# Patient Record
Sex: Male | Born: 1945 | Race: Black or African American | Hispanic: No | Marital: Married | Smoking: Former smoker
Health system: Southern US, Community
[De-identification: ages and names within clinical notes are randomized; demographics above are authoritative.]

## PROBLEM LIST (undated history)

## (undated) DIAGNOSIS — M1990 Unspecified osteoarthritis, unspecified site: Secondary | ICD-10-CM

## (undated) DIAGNOSIS — Z77098 Contact with and (suspected) exposure to other hazardous, chiefly nonmedicinal, chemicals: Secondary | ICD-10-CM

## (undated) DIAGNOSIS — F32A Depression, unspecified: Secondary | ICD-10-CM

## (undated) DIAGNOSIS — I779 Disorder of arteries and arterioles, unspecified: Secondary | ICD-10-CM

## (undated) DIAGNOSIS — K219 Gastro-esophageal reflux disease without esophagitis: Secondary | ICD-10-CM

## (undated) DIAGNOSIS — I739 Peripheral vascular disease, unspecified: Secondary | ICD-10-CM

## (undated) DIAGNOSIS — J329 Chronic sinusitis, unspecified: Secondary | ICD-10-CM

## (undated) DIAGNOSIS — G4733 Obstructive sleep apnea (adult) (pediatric): Secondary | ICD-10-CM

## (undated) DIAGNOSIS — F4312 Post-traumatic stress disorder, chronic: Secondary | ICD-10-CM

## (undated) DIAGNOSIS — I509 Heart failure, unspecified: Secondary | ICD-10-CM

## (undated) DIAGNOSIS — F4310 Post-traumatic stress disorder, unspecified: Secondary | ICD-10-CM

## (undated) DIAGNOSIS — L0293 Carbuncle, unspecified: Secondary | ICD-10-CM

## (undated) DIAGNOSIS — F40240 Claustrophobia: Secondary | ICD-10-CM

## (undated) DIAGNOSIS — E1140 Type 2 diabetes mellitus with diabetic neuropathy, unspecified: Secondary | ICD-10-CM

## (undated) DIAGNOSIS — N183 Chronic kidney disease, stage 3 unspecified: Secondary | ICD-10-CM

## (undated) DIAGNOSIS — T7840XA Allergy, unspecified, initial encounter: Secondary | ICD-10-CM

## (undated) DIAGNOSIS — F329 Major depressive disorder, single episode, unspecified: Secondary | ICD-10-CM

## (undated) DIAGNOSIS — L0292 Furuncle, unspecified: Secondary | ICD-10-CM

## (undated) DIAGNOSIS — E785 Hyperlipidemia, unspecified: Secondary | ICD-10-CM

## (undated) DIAGNOSIS — Z87891 Personal history of nicotine dependence: Secondary | ICD-10-CM

## (undated) DIAGNOSIS — E119 Type 2 diabetes mellitus without complications: Secondary | ICD-10-CM

## (undated) DIAGNOSIS — I10 Essential (primary) hypertension: Secondary | ICD-10-CM

## (undated) DIAGNOSIS — I2510 Atherosclerotic heart disease of native coronary artery without angina pectoris: Secondary | ICD-10-CM

## (undated) HISTORY — PX: CATARACT EXTRACTION W/ INTRAOCULAR LENS  IMPLANT, BILATERAL: SHX1307

## (undated) HISTORY — PX: HERNIA REPAIR: SHX51

## (undated) HISTORY — DX: Hyperlipidemia, unspecified: E78.5

## (undated) HISTORY — DX: Post-traumatic stress disorder, unspecified: F43.10

## (undated) HISTORY — PX: COLONOSCOPY W/ BIOPSIES AND POLYPECTOMY: SHX1376

## (undated) HISTORY — DX: Contact with and (suspected) exposure to other hazardous, chiefly nonmedicinal, chemicals: Z77.098

## (undated) HISTORY — DX: Claustrophobia: F40.240

## (undated) HISTORY — DX: Obstructive sleep apnea (adult) (pediatric): G47.33

## (undated) HISTORY — DX: Atherosclerotic heart disease of native coronary artery without angina pectoris: I25.10

## (undated) HISTORY — PX: CORONARY ANGIOPLASTY: SHX604

## (undated) HISTORY — PX: ABDOMINAL HERNIA REPAIR: SHX539

## (undated) HISTORY — DX: Post-traumatic stress disorder, chronic: F43.12

## (undated) HISTORY — PX: ABCESS DRAINAGE: SHX399

## (undated) HISTORY — DX: Heart failure, unspecified: I50.9

---

## 1999-07-03 ENCOUNTER — Emergency Department (HOSPITAL_COMMUNITY): Admission: EM | Admit: 1999-07-03 | Discharge: 1999-07-03 | Payer: Self-pay | Admitting: Emergency Medicine

## 1999-10-20 ENCOUNTER — Encounter: Admission: RE | Admit: 1999-10-20 | Discharge: 1999-10-20 | Payer: Self-pay | Admitting: General Surgery

## 1999-10-20 ENCOUNTER — Encounter: Payer: Self-pay | Admitting: General Surgery

## 1999-10-23 ENCOUNTER — Encounter (INDEPENDENT_AMBULATORY_CARE_PROVIDER_SITE_OTHER): Payer: Self-pay | Admitting: Specialist

## 1999-10-23 ENCOUNTER — Ambulatory Visit (HOSPITAL_BASED_OUTPATIENT_CLINIC_OR_DEPARTMENT_OTHER): Admission: RE | Admit: 1999-10-23 | Discharge: 1999-10-23 | Payer: Self-pay | Admitting: General Surgery

## 2000-02-12 ENCOUNTER — Encounter: Admission: RE | Admit: 2000-02-12 | Discharge: 2000-05-12 | Payer: Self-pay | Admitting: Geriatric Medicine

## 2002-05-03 ENCOUNTER — Inpatient Hospital Stay (HOSPITAL_COMMUNITY): Admission: EM | Admit: 2002-05-03 | Discharge: 2002-05-05 | Payer: Self-pay

## 2006-11-12 HISTORY — PX: CYST EXCISION: SHX5701

## 2008-03-23 ENCOUNTER — Emergency Department (HOSPITAL_COMMUNITY): Admission: EM | Admit: 2008-03-23 | Discharge: 2008-03-23 | Payer: Self-pay | Admitting: Emergency Medicine

## 2008-03-25 ENCOUNTER — Emergency Department (HOSPITAL_COMMUNITY): Admission: EM | Admit: 2008-03-25 | Discharge: 2008-03-25 | Payer: Self-pay | Admitting: Family Medicine

## 2008-09-20 ENCOUNTER — Emergency Department (HOSPITAL_COMMUNITY): Admission: EM | Admit: 2008-09-20 | Discharge: 2008-09-20 | Payer: Self-pay | Admitting: Family Medicine

## 2009-12-23 ENCOUNTER — Emergency Department (HOSPITAL_COMMUNITY): Admission: EM | Admit: 2009-12-23 | Discharge: 2009-12-23 | Payer: Self-pay | Admitting: Family Medicine

## 2011-03-21 ENCOUNTER — Other Ambulatory Visit: Payer: Self-pay | Admitting: Specialist

## 2011-03-30 NOTE — Cardiovascular Report (Signed)
[place]. [facility]  Patient:    NICKIE, DEREN Visit Number: [id number] MRN: [id number]          Service Type: MED Location: [phone] Attending Physician:  Lyn Records. Iii Dictated by:   Darci Needle, M.D. Proc. Date: 05/04/02 Admit Date:  05/03/2002 Discharge Date: 05/05/2002                          Cardiac Catheterization  INDICATIONS:  Recent chest discomfort, abnormal Cardiolite, admitted through the emergency room with chest pain, rule out myocardial infarction.  The patient is an obese, hypertensive, African-American, and diabetic.  PROCEDURE: 1. Left heart catheterization. 2. Selective coronary angiography. 3. Left ventriculography. 4. Vasoseal arteriotomy closure.  DESCRIPTION OF PROCEDURE:  After informed consent, a 6 French sheath was inserted into the right femoral artery using the modified Seldinger technique. A 6 French A2 multipurpose catheter was used for hemodynamic recordings, left ventriculography, and selective left and right coronary angiography. Following the procedure arteriotomy closure was performed without complications.  RESULTS:  HEMODYNAMIC DATA:  Aortic pressure 140/80.  Left ventricular pressure 140/17.  LEFT VENTRICLOGRAPHY:  The left ventricle reveals a normal size cavity with hyperdynamic contractility, EF greater than 70%.  There appears to be LVH.  SELECTIVE CORONARY ANGIOGRAPHY: 1. Short left main.  No obstruction noted. 2. Left anterior descending coronary artery.  The LAD is diffusely involved    with plaque proximally to the midvessel.  The first diagonal branch    contains a 70% stenosis. No high grade obstruction was noted in the left    anterior descending distribution other than in the first diagonal. 3. Ramus branch.  There is a large branching ramus that arises from the left    main.  No significant abnormalities are noted. 4. Circumflex artery contains irregularities.  There is 70%  narrowing in the    distal circumflex before the first obtuse marginal and the PDA.  This    stenosis is eccentric, maybe more severely narrowed than appreciated.  The    patients obesity and diaphragm prevented adequate visualization of this    particular coronary segment. 5. Right coronary artery is nondominant with 70 to 80% mid and distal    stenoses.  The acute marginal also contains 80 to 90% narrowing.  ASSESSMENT: 1. Normal left ventricular function.  Left ventricular hypertrophy noted. 2. Moderate coronary artery disease involving the nondominant right coronary,    the first diagonal, and the distal circumflex as outlined above. 3. Significant obesity preventing optimal visualization.  RECOMMENDATION:  Medical therapy, aggressive risk factor modification. Dictated by:   Darci Needle, M.D. Attending Physician:  Lyn Records. Iii DD:  05/04/02 TD:  05/05/02 Job: [id number]

## 2011-03-30 NOTE — H&P (Signed)
[place]. [facility]  Patient:    ZARON, ZWIEFELHOFER Visit Number: [id number] MRN: [id number]          Service Type: MED Location: [phone] Attending Physician:  Armanda Heritage Dictated by:   Darci Needle, M.D. Admit Date:  05/03/2002   CC:         Hal T. Stoneking, M.D.   History and Physical  INDICATION:  Atypical chest and epigastric discomfort.  This is a 65 year old African-American diabetic with an abnormal Cardiolite recently identified.  HISTORY OF PRESENT ILLNESS:  The patient is 65 years of age, African-American, obese, diabetic, with severe hypertension, poorly-controlled.  He awakened this morning with epigastric discomfort and some discomfort in his chest.  He took some Gas-X and then had some racing or fluttering of his chest.  This discomfort was always characterized as relatively mild and quantitated as a 2/10 on the pain scale.  He had a Cardiolite study done approximately a week ago that was suspicious for inferior infarction with peri-infarct ischemia. He is admitted at this time to rule out myocardial infarction and perhaps go ahead and have definitive cardiac evaluation.  HABITS:  Denies ethanol and tobacco.  PAST MEDICAL HISTORY: 1. Hypertension with poor control. 2. Diabetes, noninsulin-dependent. 3. Obesity. 4. Degenerative joint disease, hips. 5. Known mild sleep apnea. 6. Hyperlipidemia.  FAMILY HISTORY:  Positive for diabetes and high blood pressure.  No history of coronary atherosclerotic heart disease.  ALLERGIES:  None.  PAST SURGICAL HISTORY:  Status post inguinal herniorrhaphy.  MEDICATIONS:  He does not remember any of the doses.  We were able to determine that he is on Adalat CC 90 mg per day, HCTZ recently started at 12.5 mg per day, Rhinocort, Clarinex, Monopril possibly 10 mg per day, Amaryl unknown dose, Lipitor 80 mg per day, and Cardura unknown dose.  REVIEW OF SYSTEMS:  Negative melena, negative  hematemesis.  No history of peptic ulcer disease.  Denies COPD/asthma.  No neurological symptoms.  No prior stroke.  No seizure disorder.  Denies claudication.  Denies orthopnea or PND.  Has never had syncope.  PHYSICAL EXAMINATION:  VITAL SIGNS:  Blood pressure 144/66, heart rate 90, _____ 16.  GENERAL:  The patient is in no distress, obese.  SKIN:  Clear, no xanthelasma.  HEENT:  Unremarkable.  Extraocular movements are full.  NECK:  No JVD, thyromegaly, or carotid bruits.  CHEST:  Clear to auscultation and percussion.  CARDIAC:  Cardiac exam reveals an S4, a 1/6 systolic murmur, no diastolic murmur.  PMI is not palpable.  ABDOMEN:  Markedly obese.  Liver edge is not palpable.  There is no abdominal tenderness.  EXTREMITIES:  No edema.  Femoral pulses 2+ bilaterally, deep bilaterally. Posterior tibials are 2+, radials are 2+.  NEUROLOGIC:  Normal.  No motor or sensory deficits noted.  LABORATORY DATA:  Chest x-ray:  Cardiac enlargement with questionable mild vascular congestion.  EKG:  LVH with strain, chronic finding.  CBC:  Hemoglobin 13.9, WBC 8.6.  Creatinine 1.5, BUN 19, potassium 3.8, blood sugar pending.  INR 0.9, PTT 28 seconds.  Troponin I 0.01, CK-MB 168/1.9.  IMPRESSION: 1. Atypical chest and epigastric discomfort in a 65 year old African-American    with diabetes, hypertension, and abnormal Cardiolite.  Rule out myocardial    infarction. 2. Diabetes. 3. Hypertension. 4. Obesity. 5. Possible congestive heart failure on chest x-ray.  PLAN: 1. Admit, rule out MI. 2. Coronary angiography for definitive evaluation of abnormal Cardiolite  and    recurring atypical chest pain. 3. Check BNP and administer Lasix therapy if elevated. Dictated by:   Darci Needle, M.D. Attending Physician:  Armanda Heritage DD:  05/03/02 TD:  05/03/02 Job: [id number]

## 2011-03-30 NOTE — Op Note (Signed)
[place]. [facility]  Patient:    Joel Rivera                         MRN: [id number] Proc. Date: 10/23/99 Adm. Date:  [id number] Attending:  Cherylynn Ridges                           Operative Report  PREOPERATIVE DIAGNOSIS:  Umbilical hernia.  POSTOPERATIVE DIAGNOSIS:  Umbilical hernia.  PROCEDURE:  Repair of umbilical hernia.  SURGEON:  Jimmye Norman, M.D.  ASSISTANT:  None.  ANESTHESIA:  General endotracheal.  ESTIMATED BLOOD LOSS:  Less than 10 cc.  COMPLICATIONS:  None.  CONDITION:  Stable.  INDICATIONS FOR OPERATION:  The patient is a 65 year old gentleman with symptomatic umbilical hernia who now comes in for repair with partial incarceration of what  appears to be omentum.  FINDINGS:  The patient had a little preperitoneal fat or omentum stuck into the  hernia sac.  This was excised, along with the sac, and closure done with Ethibond tier.  DESCRIPTION OF PROCEDURE:  The patient was taken to the operating room and placed on the table in the supine position.  After an adequate general anesthetic was administered, he was prepped and draped in the usual sterile manner exposing the midline of the abdomen.  A supraumbilical curvilinear incision approximately 2-2.2 cm in length was made  using a #10 blade.  We took this down into the subcutaneous tissue and then dissected out the hernia sac, which went directly into the umbilicus.  We dissected the sac away from the subcuticular area of the skin, and subsequently ligated the sac using #0 Ethibond.  We made sure that electrocautery was used to cauterize he edges.  We then reapproximated the fascial edges using interrupted #0 Ethibond sutures.  Approximately six sutures were placed in the repair.  Subsequently, we injected with 0.5% Marcaine without epinephrine.  We closed in two layers.  The  deep subcu layer of 3-0 Vicryl, and the skin was closed using a running subcuticular  stitch of 5-0 Vicryl.  All needle counts, sponge counts, and instrument counts were correct.  A sterile dressing was applied. DD:  10/23/99 TD:  10/23/99 Job: [id number]

## 2011-08-08 LAB — CULTURE, ROUTINE-ABSCESS: Gram Stain: NONE SEEN

## 2012-11-01 ENCOUNTER — Emergency Department (HOSPITAL_COMMUNITY)
Admission: EM | Admit: 2012-11-01 | Discharge: 2012-11-01 | Disposition: A | Discharge status: Home / Self Care | Payer: Medicare Other | Attending: Emergency Medicine | Admitting: Emergency Medicine

## 2012-11-01 ENCOUNTER — Encounter (HOSPITAL_COMMUNITY): Payer: Self-pay | Admitting: *Deleted

## 2012-11-01 DIAGNOSIS — L0291 Cutaneous abscess, unspecified: Secondary | ICD-10-CM

## 2012-11-01 DIAGNOSIS — Z8709 Personal history of other diseases of the respiratory system: Secondary | ICD-10-CM | POA: Insufficient documentation

## 2012-11-01 DIAGNOSIS — L0293 Carbuncle, unspecified: Secondary | ICD-10-CM | POA: Insufficient documentation

## 2012-11-01 DIAGNOSIS — J329 Chronic sinusitis, unspecified: Secondary | ICD-10-CM | POA: Insufficient documentation

## 2012-11-01 DIAGNOSIS — Z87891 Personal history of nicotine dependence: Secondary | ICD-10-CM | POA: Insufficient documentation

## 2012-11-01 DIAGNOSIS — L02818 Cutaneous abscess of other sites: Secondary | ICD-10-CM | POA: Insufficient documentation

## 2012-11-01 DIAGNOSIS — E119 Type 2 diabetes mellitus without complications: Secondary | ICD-10-CM | POA: Insufficient documentation

## 2012-11-01 DIAGNOSIS — Z79899 Other long term (current) drug therapy: Secondary | ICD-10-CM | POA: Insufficient documentation

## 2012-11-01 DIAGNOSIS — Z794 Long term (current) use of insulin: Secondary | ICD-10-CM | POA: Insufficient documentation

## 2012-11-01 DIAGNOSIS — I10 Essential (primary) hypertension: Secondary | ICD-10-CM | POA: Insufficient documentation

## 2012-11-01 HISTORY — DX: Furuncle, unspecified: L02.92

## 2012-11-01 HISTORY — DX: Carbuncle, unspecified: L02.93

## 2012-11-01 HISTORY — DX: Essential (primary) hypertension: I10

## 2012-11-01 MED ORDER — SULFAMETHOXAZOLE-TRIMETHOPRIM 800-160 MG PO TABS: 1.0000 | ORAL_TABLET | Freq: Two times a day (BID) | ORAL | Status: DC

## 2012-11-01 NOTE — ED Notes (Addendum)
Pt Reports posterior abscess x 4-5 months. States "it wasn't painful until now. I get them all the time because I am a war vet. I usually go to the [place], but the pain was bad so I came here". Abscess aprrox 6in long. Aprrox 3 in wide.

## 2012-11-01 NOTE — ED Notes (Signed)
PA and EDP at bedside.

## 2012-11-01 NOTE — ED Notes (Signed)
PT reports a painful abces on back of head. Pt reports a Hx of this due to Agent orange exposure.

## 2012-11-01 NOTE — ED Notes (Signed)
Pt states understanding of discharge instructions 

## 2012-11-01 NOTE — ED Provider Notes (Signed)
History     CSN: [id number]  Arrival date & time 11/01/12  1946   First MD Initiated Contact with Patient 11/01/12 2141      Chief Complaint  Patient presents with  . Abscess    back of head    (Consider location/radiation/quality/duration/timing/severity/associated sxs/prior treatment) HPI Comments: This is a 66 year old male, who presents emergency department with chief complaint of abscess. Patient states that he is at an abscess on the back of his head for the past 3 months. He states that it has been worsening. He has not tried anything to alleviate his symptoms. It is painful to the touch, and pressure makes his pain worse. States that his pain is moderate to severe.  The history is provided by the patient. No language interpreter was used.    Past Medical History  Diagnosis Date  . Diabetes mellitus without complication   . Hypertension   . Sinus infection     frequent  . Recurrent boils     abceses    History reviewed. No pertinent past surgical history.  History reviewed. No pertinent family history.  History  Substance Use Topics  . Smoking status: Former Smoker    Quit date: 11/01/1993  . Smokeless tobacco: Not on file  . Alcohol Use: No      Review of Systems  All other systems reviewed and are negative.    Allergies  Review of patient's allergies indicates no known allergies.  Home Medications   Current Outpatient Rx  Name  Route  Sig  Dispense  Refill  . CHLORTHALIDONE 50 MG PO TABS   Oral   Take 25 mg by mouth daily.         Marland Kitchen DOCUSATE SODIUM 100 MG PO CAPS   Oral   Take 200 mg by mouth 2 (two) times daily as needed. For constipation         . FLUTICASONE PROPIONATE 50 MCG/ACT NA SUSP   Nasal   Place 2 sprays into the nose daily.         . FOSINOPRIL SODIUM 40 MG PO TABS   Oral   Take 40 mg by mouth daily.         . FUROSEMIDE 20 MG PO TABS   Oral   Take 20 mg by mouth daily.         . INSULIN GLARGINE 100 UNIT/ML  [place] SOLN   Subcutaneous   Inject 45 Units into the skin at bedtime.         Marland Kitchen LORATADINE 10 MG PO TABS   Oral   Take 10 mg by mouth daily.         Marland Kitchen NAPROXEN SODIUM 220 MG PO TABS   Oral   Take 440 mg by mouth daily as needed. For pain         . NIFEDIPINE ER OSMOTIC 60 MG PO TB24   Oral   Take 30 mg by mouth 2 (two) times daily.         Marland Kitchen OMEPRAZOLE 20 MG PO CPDR   Oral   Take 20 mg by mouth 2 (two) times daily.         Marland Kitchen PRAVASTATIN SODIUM 10 MG PO TABS   Oral   Take 5 mg by mouth at bedtime.         . TERAZOSIN HCL 10 MG PO CAPS   Oral   Take 10 mg by mouth at bedtime.         Marland Kitchen  SULFAMETHOXAZOLE-TRIMETHOPRIM 800-160 MG PO TABS   Oral   Take 1 tablet by mouth every 12 (twelve) hours.   20 tablet   0     BP 148/60  Pulse 75  Temp 98.3 F (36.8 C) (Oral)  Resp 18  SpO2 97%  Physical Exam  Nursing note and vitals reviewed. Constitutional: He is oriented to person, place, and time. He appears well-developed and well-nourished.  HENT:  Head: Normocephalic and atraumatic.       4 x 6 cm fluctuant abscess in the suboccipital region, without surrounding erythema, a small amount of drainage present.  Eyes: Conjunctivae normal and EOM are normal.  Neck: Normal range of motion.  Cardiovascular: Normal rate.   Pulmonary/Chest: Effort normal.  Abdominal: He exhibits no distension.  Musculoskeletal: Normal range of motion.  Neurological: He is alert and oriented to person, place, and time.  Skin: Skin is dry.  Psychiatric: He has a normal mood and affect. His behavior is normal. Judgment and thought content normal.    ED Course  Procedures (including critical care time)  Labs Reviewed - No data to display No results found. INCISION AND DRAINAGE Performed by: Roxy Horseman Consent: Verbal consent obtained. Risks and benefits: risks, benefits and alternatives were discussed Type: abscess  Body area: suboccipital region  Anesthesia: local  infiltration  Incision was made with a scalpel.  Local anesthetic: lidocaine 2% with epinephrine  Anesthetic total: 5 ml  Complexity: complex Blunt dissection to break up loculations  Drainage: purulent  Drainage amount: 10 ml  Packing material: Not packed, elliptical incision used.   Patient tolerance: Patient tolerated the procedure well with no immediate complications.     1. Abscess       MDM  66 year old male with abscess. Drained of the fluctuant material from the abscess, remains slightly indurated, I will tell the patient to use warm compresses and will prescribe Bactrim. I will have the patient followup with his primary care provider as these abscesses are recurrent. Patient understands and agrees with the plan. He is stable and ready for discharge.        Roxy Horseman, PA-C 11/01/12 2343

## 2012-11-02 NOTE — ED Provider Notes (Signed)
Medical screening examination/treatment/procedure(s) were performed by non-physician practitioner and as supervising physician I was immediately available for consultation/collaboration.  Olivia Mackie, MD 11/02/12 [phone]

## 2013-02-13 ENCOUNTER — Emergency Department (INDEPENDENT_AMBULATORY_CARE_PROVIDER_SITE_OTHER)
Admission: EM | Admit: 2013-02-13 | Discharge: 2013-02-13 | Disposition: A | Discharge status: Home / Self Care | Payer: Medicare Other | Source: Home / Self Care | Attending: Emergency Medicine | Admitting: Emergency Medicine

## 2013-02-13 ENCOUNTER — Encounter (HOSPITAL_COMMUNITY): Payer: Self-pay | Admitting: Emergency Medicine

## 2013-02-13 DIAGNOSIS — J019 Acute sinusitis, unspecified: Secondary | ICD-10-CM

## 2013-02-13 DIAGNOSIS — M47812 Spondylosis without myelopathy or radiculopathy, cervical region: Secondary | ICD-10-CM

## 2013-02-13 DIAGNOSIS — J309 Allergic rhinitis, unspecified: Secondary | ICD-10-CM

## 2013-02-13 DIAGNOSIS — I10 Essential (primary) hypertension: Secondary | ICD-10-CM

## 2013-02-13 MED ORDER — AMOXICILLIN 500 MG PO CAPS: 1000.0000 mg | ORAL_CAPSULE | Freq: Three times a day (TID) | ORAL | Status: DC

## 2013-02-13 MED ORDER — TRAMADOL HCL 50 MG PO TABS: 100.0000 mg | ORAL_TABLET | Freq: Three times a day (TID) | ORAL | Status: DC | PRN

## 2013-02-13 MED ORDER — METHOCARBAMOL 500 MG PO TABS: 500.0000 mg | ORAL_TABLET | Freq: Three times a day (TID) | ORAL | Status: DC

## 2013-02-13 MED ORDER — MELOXICAM 7.5 MG PO TABS: 7.5000 mg | ORAL_TABLET | Freq: Every day | ORAL | Status: DC

## 2013-02-13 MED ORDER — AZELASTINE HCL 0.15 % NA SOLN: NASAL | Status: DC

## 2013-02-13 NOTE — ED Notes (Signed)
Reports: sinus pressure and pain. Post nasal drip. Productive cough with clear sputum.  Neck pain/stiffness with movement. Swollen lymph nodes. Pt denies sob. Chest pain. And fever  Pt has used mucinex and aleve with mild relief of symptoms.   Pt states symptoms present x several weeks now.

## 2013-02-13 NOTE — ED Provider Notes (Addendum)
Chief Complaint:   Chief Complaint  Patient presents with  . Facial Pain    sinus pressure and pain. post nasal drip. productive cough    History of Present Illness:   Joel Rivera is a 67 year old male with diabetes and hypertension who presents today with nasal congestion and neck pain.  1. Nasal congestion: This is been going on for months or years. He has clear nasal drainage, sneezing, nasal itching, and itchy, watery eyes. Sometimes his congestion is yellowish in color. He has copious postnasal drip and cough productive of clear sputum. He denies any fever, chills, headache, facial pain, or sore throat. He's had no wheezing or history of allergies or asthma.  2. Neck pain: This is been going on for months or years also it hurts him to turn his neck. He notes decreased range of motion in his neck and crepitus with movement. He denies any pain radiating down the arms, numbness, tingling, or muscle weakness in the upper extremities.  Review of Systems:  Other than noted above, the patient denies any of the following symptoms. Systemic:  No fever, chills, sweats, fatigue, myalgias, headache, or anorexia. Eye:  No redness, pain or drainage. ENT:  No earache, nasal congestion, rhinorrhea, sinus pressure, or sore throat. Lungs:  No cough, sputum production, wheezing, shortness of breath.  Cardiovascular:  No chest pain, palpitations, or syncope. GI:  No nausea, vomiting, abdominal pain or diarrhea. GU:  No dysuria, frequency, or hematuria. Skin:  No rash or pruritis.  PMFSH:  Past medical history, family history, social history, meds, and allergies were reviewed.  He takes chlorthalidone, Monopril, Lasix, Lantus insulin, Claritin, Procardia, Pravachol, Hytrin, Flonase, and Anaprox. He has no known medication allergies.  Physical Exam:   Vital signs:  BP 138/80  Pulse 86  Temp(Src) 98.7 F (37.1 C) (Oral)  SpO2 95% General:  Alert, in no distress. Eye:  PERRL, full EOMs.  Lids and  conjunctivas were normal. ENT:  TMs and canals were normal, without erythema or inflammation.  Nasal mucosa was clear and uncongested, without drainage.  Mucous membranes were moist.  Pharynx was clear, without exudate or drainage.  There were no oral ulcerations or lesions. Neck:  He has mild tenderness to palpation over trapezius ridges. The neck has a very limited range of motion with 20 of extension, 45 of flexion, 10 of lateral bending, and 45 of rotation with pain. Thyroid was normal. Lungs:  No respiratory distress.  Lungs were clear to auscultation, without wheezes, rales or rhonchi.  Breath sounds were clear and equal bilaterally. Heart:  Regular rhythm, without gallops, murmers or rubs. Abdomen:  Soft, flat, and non-tender to palpation.  No hepatosplenomagaly or mass. Skin:  Clear, warm, and dry, without rash or lesions.  Assessment:  The primary encounter diagnosis was Allergic rhinitis. Diagnoses of Acute sinusitis, Osteoarthritis cervical spine, and Hypertension were also pertinent to this visit.  He appears to have allergic rhinitis with secondary sinusitis. His neck pain it appears to be on the basis of degenerative joint disease or osteoarthritis. He was urged to followup with a primary care physician for both of these issues within a week.  Plan:   1.  The following meds were prescribed:   Discharge Medication List as of 02/13/2013  5:41 PM    START taking these medications   Details  amoxicillin (AMOXIL) 500 MG capsule Take 2 capsules (1,000 mg total) by mouth 3 (three) times daily., Starting 02/13/2013, Until Discontinued, Normal    Azelastine  HCl 0.15 % SOLN 2 sprays in each nostril BID for allergies, Normal    meloxicam (MOBIC) 7.5 MG tablet Take 1 tablet (7.5 mg total) by mouth daily., Starting 02/13/2013, Until Discontinued, Normal    methocarbamol (ROBAXIN) 500 MG tablet Take 1 tablet (500 mg total) by mouth 3 (three) times daily., Starting 02/13/2013, Until Discontinued,  Normal    traMADol (ULTRAM) 50 MG tablet Take 2 tablets (100 mg total) by mouth every 8 (eight) hours as needed for pain., Starting 02/13/2013, Until Discontinued, Normal       2.  The patient was instructed in symptomatic care and handouts were given. 3.  The patient was told to return if becoming worse in any way, if no better in 3 or 4 days, and given some red flag symptoms such as fever or neurological symptoms that would indicate earlier return.    Reuben Likes, MD 02/13/13 1958  Reuben Likes, MD 02/13/13 [phone]

## 2013-06-02 ENCOUNTER — Emergency Department (HOSPITAL_COMMUNITY)
Admission: EM | Admit: 2013-06-02 | Discharge: 2013-06-02 | Disposition: A | Discharge status: Home / Self Care | Payer: Medicare Other | Source: Home / Self Care | Attending: Family Medicine | Admitting: Family Medicine

## 2013-06-02 ENCOUNTER — Encounter (HOSPITAL_COMMUNITY): Payer: Self-pay | Admitting: *Deleted

## 2013-06-02 DIAGNOSIS — IMO0002 Reserved for concepts with insufficient information to code with codable children: Secondary | ICD-10-CM

## 2013-06-02 DIAGNOSIS — L03113 Cellulitis of right upper limb: Secondary | ICD-10-CM

## 2013-06-02 MED ORDER — DOXYCYCLINE HYCLATE 100 MG PO CAPS: 100.0000 mg | ORAL_CAPSULE | Freq: Two times a day (BID) | ORAL | Status: DC

## 2013-06-02 NOTE — ED Notes (Signed)
Pt noticed  A  Bump on r  Forearm  Which  He  Reports  Has  Been there  For  3 days    The bump is      Hard  To the  Touch        He  Is   A   Diabetic

## 2013-06-02 NOTE — ED Provider Notes (Addendum)
History    CSN: [id number] Arrival date & time 06/02/13  1039  None    Chief Complaint  Patient presents with  . Abscess   (Consider location/radiation/quality/duration/timing/severity/associated sxs/prior Treatment) Patient is a 67 y.o. male presenting with abscess. The history is provided by the patient.  Abscess Location:  Shoulder/arm Shoulder/arm abscess location:  R forearm Abscess quality: induration, painful and redness   Abscess quality: not draining, no fluctuance and no warmth   Red streaking: no   Duration:  3 days Progression:  Unchanged Pain details:    Quality:  Dull   Severity:  Mild  Past Medical History  Diagnosis Date  . Diabetes mellitus without complication   . Hypertension   . Sinus infection     frequent  . Recurrent boils     abceses   History reviewed. No pertinent past surgical history. History reviewed. No pertinent family history. History  Substance Use Topics  . Smoking status: Former Smoker    Quit date: 11/01/1993  . Smokeless tobacco: Not on file  . Alcohol Use: No    Review of Systems  Constitutional: Negative.   Skin: Negative.     Allergies  Review of patient's allergies indicates no known allergies.  Home Medications   Current Outpatient Rx  Name  Route  Sig  Dispense  Refill  . amoxicillin (AMOXIL) 500 MG capsule   Oral   Take 2 capsules (1,000 mg total) by mouth 3 (three) times daily.   60 capsule   0     Dispense as written.   . Azelastine HCl 0.15 % SOLN      2 sprays in each nostril BID for allergies   30 mL   3   . chlorthalidone (HYGROTON) 50 MG tablet   Oral   Take 25 mg by mouth daily.         Marland Kitchen docusate sodium (COLACE) 100 MG capsule   Oral   Take 200 mg by mouth 2 (two) times daily as needed. For constipation         . doxycycline (VIBRAMYCIN) 100 MG capsule   Oral   Take 1 capsule (100 mg total) by mouth 2 (two) times daily.   20 capsule   0   . fluticasone (FLONASE) 50 MCG/ACT  nasal spray   Nasal   Place 2 sprays into the nose daily.         . fosinopril (MONOPRIL) 40 MG tablet   Oral   Take 40 mg by mouth daily.         . furosemide (LASIX) 20 MG tablet   Oral   Take 20 mg by mouth daily.         . insulin glargine (LANTUS) 100 UNIT/ML injection   Subcutaneous   Inject 45 Units into the skin at bedtime.         Marland Kitchen loratadine (CLARITIN) 10 MG tablet   Oral   Take 10 mg by mouth daily.         . meloxicam (MOBIC) 7.5 MG tablet   Oral   Take 1 tablet (7.5 mg total) by mouth daily.   15 tablet   0   . methocarbamol (ROBAXIN) 500 MG tablet   Oral   Take 1 tablet (500 mg total) by mouth 3 (three) times daily.   30 tablet   0   . naproxen sodium (ANAPROX) 220 MG tablet   Oral   Take 440 mg by mouth daily as needed.  For pain         . NIFEdipine (PROCARDIA XL/ADALAT-CC) 60 MG 24 hr tablet   Oral   Take 30 mg by mouth 2 (two) times daily.         Marland Kitchen omeprazole (PRILOSEC) 20 MG capsule   Oral   Take 20 mg by mouth 2 (two) times daily.         . pravastatin (PRAVACHOL) 10 MG tablet   Oral   Take 5 mg by mouth at bedtime.         . sulfamethoxazole-trimethoprim (SEPTRA DS) 800-160 MG per tablet   Oral   Take 1 tablet by mouth every 12 (twelve) hours.   20 tablet   0   . terazosin (HYTRIN) 10 MG capsule   Oral   Take 10 mg by mouth at bedtime.         . traMADol (ULTRAM) 50 MG tablet   Oral   Take 2 tablets (100 mg total) by mouth every 8 (eight) hours as needed for pain.   30 tablet   0    BP 130/80  Pulse 78  Temp(Src) 98.6 F (37 C) (Oral)  Resp 14  SpO2 100% Physical Exam  Nursing note and vitals reviewed. Constitutional: He is oriented to person, place, and time. He appears well-developed and well-nourished.  Musculoskeletal: He exhibits tenderness.  Neurological: He is alert and oriented to person, place, and time.  Skin: Skin is warm and dry. Rash noted. There is erythema.  1.5 cm circ erythematous  lesion to right volar forearm, indurated, no fluct or drainage. Min soreness.    ED Course  Procedures (including critical care time) Labs Reviewed - No data to display No results found. 1. Cellulitis of arm, right     MDM    Linna Hoff, MD 06/02/13 1144  Linna Hoff, MD 06/02/13 [phone]

## 2014-06-05 ENCOUNTER — Encounter (HOSPITAL_COMMUNITY): Payer: Self-pay | Admitting: Emergency Medicine

## 2014-06-05 ENCOUNTER — Emergency Department (INDEPENDENT_AMBULATORY_CARE_PROVIDER_SITE_OTHER)
Admission: EM | Admit: 2014-06-05 | Discharge: 2014-06-05 | Disposition: A | Discharge status: Home / Self Care | Payer: Medicare Other | Source: Home / Self Care | Attending: Family Medicine | Admitting: Family Medicine

## 2014-06-05 DIAGNOSIS — L723 Sebaceous cyst: Secondary | ICD-10-CM

## 2014-06-05 DIAGNOSIS — L089 Local infection of the skin and subcutaneous tissue, unspecified: Secondary | ICD-10-CM

## 2014-06-05 MED ORDER — TRAMADOL HCL 50 MG PO TABS: 50.0000 mg | ORAL_TABLET | Freq: Four times a day (QID) | ORAL | Status: DC | PRN

## 2014-06-05 MED ORDER — CEPHALEXIN 500 MG PO CAPS: 500.0000 mg | ORAL_CAPSULE | Freq: Two times a day (BID) | ORAL | Status: DC

## 2014-06-05 NOTE — ED Provider Notes (Addendum)
CSN: [id number]     Arrival date & time 06/05/14  1456 History   First MD Initiated Contact with Patient 06/05/14 1550     Chief Complaint  Patient presents with  . Recurrent Skin Infections   (Consider location/radiation/quality/duration/timing/severity/associated sxs/prior Treatment) HPI  Recurring skin infection: L neck. Growing. Painful to palpation. Draining the other day but made worse after squeezing it. Present for months. Gradual growth. Denies fevers, rash, weakness. H/lo recurring boils.    Past Medical History  Diagnosis Date  . Diabetes mellitus without complication   . Hypertension   . Sinus infection     frequent  . Recurrent boils     abceses   History reviewed. No pertinent past surgical history. History reviewed. No pertinent family history. History  Substance Use Topics  . Smoking status: Former Smoker    Quit date: 11/01/1993  . Smokeless tobacco: Not on file  . Alcohol Use: No    Review of Systems Per HPI with all other pertinent systems negative.   Allergies  Review of patient's allergies indicates no known allergies.  Home Medications   Prior to Admission medications   Medication Sig Start Date End Date Taking? Authorizing Provider  amoxicillin (AMOXIL) 500 MG capsule Take 2 capsules (1,000 mg total) by mouth 3 (three) times daily. 02/13/13   Harden Mo, MD  Azelastine HCl 0.15 % SOLN 2 sprays in each nostril BID for allergies 02/13/13   Harden Mo, MD  cephALEXin (KEFLEX) 500 MG capsule Take 1 capsule (500 mg total) by mouth 2 (two) times daily. 06/05/14   Waldemar Dickens, MD  chlorthalidone (HYGROTON) 50 MG tablet Take 25 mg by mouth daily.    Historical Provider, MD  docusate sodium (COLACE) 100 MG capsule Take 200 mg by mouth 2 (two) times daily as needed. For constipation    Historical Provider, MD  doxycycline (VIBRAMYCIN) 100 MG capsule Take 1 capsule (100 mg total) by mouth 2 (two) times daily. 06/02/13   Billy Fischer, MD  fluticasone  (FLONASE) 50 MCG/ACT nasal spray Place 2 sprays into the nose daily.    Historical Provider, MD  fosinopril (MONOPRIL) 40 MG tablet Take 40 mg by mouth daily.    Historical Provider, MD  furosemide (LASIX) 20 MG tablet Take 20 mg by mouth daily.    Historical Provider, MD  insulin glargine (LANTUS) 100 UNIT/ML injection Inject 45 Units into the skin at bedtime.    Historical Provider, MD  loratadine (CLARITIN) 10 MG tablet Take 10 mg by mouth daily.    Historical Provider, MD  meloxicam (MOBIC) 7.5 MG tablet Take 1 tablet (7.5 mg total) by mouth daily. 02/13/13   Harden Mo, MD  methocarbamol (ROBAXIN) 500 MG tablet Take 1 tablet (500 mg total) by mouth 3 (three) times daily. 02/13/13   Harden Mo, MD  naproxen sodium (ANAPROX) 220 MG tablet Take 440 mg by mouth daily as needed. For pain    Historical Provider, MD  NIFEdipine (PROCARDIA XL/ADALAT-CC) 60 MG 24 hr tablet Take 30 mg by mouth 2 (two) times daily.    Historical Provider, MD  omeprazole (PRILOSEC) 20 MG capsule Take 20 mg by mouth 2 (two) times daily.    Historical Provider, MD  pravastatin (PRAVACHOL) 10 MG tablet Take 5 mg by mouth at bedtime.    Historical Provider, MD  sulfamethoxazole-trimethoprim (SEPTRA DS) 800-160 MG per tablet Take 1 tablet by mouth every 12 (twelve) hours. 11/01/12   Montine Circle, PA-C  terazosin (HYTRIN) 10 MG capsule Take 10 mg by mouth at bedtime.    Historical Provider, MD  traMADol (ULTRAM) 50 MG tablet Take 2 tablets (100 mg total) by mouth every 8 (eight) hours as needed for pain. 02/13/13   Harden Mo, MD  traMADol (ULTRAM) 50 MG tablet Take 1 tablet (50 mg total) by mouth every 6 (six) hours as needed. 06/05/14   Waldemar Dickens, MD   BP 156/80  Pulse 72  Temp(Src) 98.8 F (37.1 C) (Oral)  SpO2 96% Physical Exam  Constitutional: He is oriented to person, place, and time. He appears well-developed and well-nourished. No distress.  HENT:  Head: Normocephalic and atraumatic.  Eyes: EOM are  normal. Pupils are equal, round, and reactive to light.  Neck: Normal range of motion.  Cardiovascular: Normal rate, normal heart sounds and intact distal pulses.   No murmur heard. Pulmonary/Chest: Effort normal and breath sounds normal. No respiratory distress.  Abdominal: Soft. He exhibits no distension.  Musculoskeletal: Normal range of motion. He exhibits no edema and no tenderness.  Neurological: He is alert and oriented to person, place, and time.  Skin: Skin is warm. He is not diaphoretic.  Large firm 4x4cm projection of the upper back w/ draining sinus tract (clear drainage).   Psychiatric: His behavior is normal.    ED Course  Procedures (including critical care time) Labs Review Labs Reviewed  WOUND CULTURE   Incision and Drainage Procedure Note  Pre-operative Diagnosis: infected sebaceous cyst  Post-operative Diagnosis: same    Anesthesia: 1% lidocaine with epinephrine  Procedure Details  The procedure, risks and complications have been discussed in detail (including, but not limited to airway compromise, infection, bleeding) with the patient, and the patient has signed consent to the procedure.  The skin was sterilely prepped and draped over the affected area in the usual fashion. After adequate local anesthesia, I&D with a #11 blade was performed on the left, posterior back. 2.cm incision. Loculations broken up. Purulent drainage: present  The patient was observed until stable.  Findings: Approximately 2.5cm deep and 3cm circumferential   10 inches of iodiform gauze placed  Condition: Tolerated procedure well   Complications: none.   Imaging Review No results found.   MDM   1. Infected sebaceous cyst    Large infected cyst. Copious purulent drainage on incision. Copious irrigation and iodiform gauze placed. Due to loculations, and size of infection and h/o recent attempts to pop the cyst, will start on Keflex. Remove 1-2 inches packing per day and  apply gauze daily until wound closes. Return precautions given. NSAIDs not an option so limited # tramadol given F/u PCP in several months for removal of cyst.   Linna Darner, MD Family Medicine 06/05/2014, 4:27 PM      Waldemar Dickens, MD 06/05/14 1627     -------------------------------------- Addendum  Cx w/ Pseudo in addition to other G+ Start cipro Pt called and aware  Waldemar Dickens, MD 06/08/14 2134

## 2014-06-05 NOTE — ED Notes (Signed)
C/o boil on back States the area was busted last night with little drainage States area is sore

## 2014-06-05 NOTE — Discharge Instructions (Signed)
You had a very large infected sebaceous cyst of your back This was successfully drinaed in our clinic Please remove 1-2 inches of the packing per day and cut it away Please use the pain medications as needed Please start the keflex today Please come back if this does not resolve or gets worse Please consider getting the cyst removed sometime in the next several months by your PCP.

## 2014-06-08 LAB — WOUND CULTURE: Special Requests: NORMAL

## 2014-06-08 MED ORDER — CIPROFLOXACIN HCL 500 MG PO TABS: 500.0000 mg | ORAL_TABLET | Freq: Two times a day (BID) | ORAL | Status: DC

## 2014-06-08 NOTE — ED Notes (Signed)
Wound culture back cyst: Few Pseudomonas Aeruginosa. Pt. treated with Keflex. Needs Cipro.  Dr. Marily Memos said he would try to call in AM. Roselyn Meier 06/08/2014

## 2014-06-09 ENCOUNTER — Telehealth (HOSPITAL_COMMUNITY): Payer: Self-pay | Admitting: *Deleted

## 2014-06-09 NOTE — ED Notes (Addendum)
Wound culture back: Few pseudomonas aeruginosa.  Pt. treated with Keflex.  7/28  Dr. Marily Memos said he was going to call pt. in AM and add Cipro.  I called pt. tonight.  He said he talked with Dr. Marily Memos and has picked up his Rx. of Cipro. He said he was starting it tonight.  Pt. instructed to f/u with his PCP to make sure it goes away completely.  Pt. voiced understanding. Roselyn Meier 06/09/2014

## 2014-06-11 NOTE — ED Notes (Signed)
Pt.'s wife called and said they have been removing 1-2 inches of packing a day with dressing change and today is the 5th day.  She asked if they continue 1 " /day or take it all out.  Discussed with Dr. Bridgett Larsson. She said take it all out.  Pt.'s wife notified and does not feel comfortable doing this. I told her he can come tonight or in AM and let the Dr. remove it and recheck the wound. Pt. notified as well. Roselyn Meier 06/11/2014

## 2014-06-12 ENCOUNTER — Encounter (HOSPITAL_COMMUNITY): Payer: Self-pay | Admitting: Emergency Medicine

## 2014-06-12 ENCOUNTER — Emergency Department (INDEPENDENT_AMBULATORY_CARE_PROVIDER_SITE_OTHER)
Admission: EM | Admit: 2014-06-12 | Discharge: 2014-06-12 | Disposition: A | Discharge status: Home / Self Care | Payer: Medicare Other | Source: Home / Self Care | Attending: Emergency Medicine | Admitting: Emergency Medicine

## 2014-06-12 DIAGNOSIS — L089 Local infection of the skin and subcutaneous tissue, unspecified: Secondary | ICD-10-CM

## 2014-06-12 DIAGNOSIS — L723 Sebaceous cyst: Secondary | ICD-10-CM

## 2014-06-12 NOTE — ED Notes (Signed)
Recheck of infected cyst.  His wife did not want to remove the packing.  She has been cutting 1-2 " off the last 5 days.  Pt. Wants referral to surgeon to get cyst sack removed.

## 2014-06-12 NOTE — Discharge Instructions (Signed)
Now that the packing has been removed from your abscess, you may bathe and shower as normal.  You should change the dressing at least once a day.  You may change it more often if it becomes soiled with drainage.  Wash the wound well with soap and water, pat dry, apply an antibiotic ointment (Neosporin, Polysporin, or Bacitracin are all OK), then cover with a non-stick dressing such as Telfa with several layers of plain gause over that to absorb drainage.  You may secure this in place with tape or with roll gauze and tape.  Keep the wound covered for until it stops draining.  This usually takes 7 days, but may be longer.  Return for a recheck if you have heavy bleeding, increasing pain, fever, or the wound looks worse.   Epidermal Cyst An epidermal cyst is usually a small, painless lump under the skin. Cysts often occur on the face, neck, stomach, chest, or genitals. The cyst may be filled with a bad smelling paste. Do not pop your cyst. Popping the cyst can cause pain and puffiness (swelling). HOME CARE   Only take medicines as told by your doctor.  Take your medicine (antibiotics) as told. Finish it even if you start to feel better. GET HELP RIGHT AWAY IF:  Your cyst is tender, red, or puffy.  You are not getting better, or you are getting worse.  You have any questions or concerns. MAKE SURE YOU:  Understand these instructions.  Will watch your condition.  Will get help right away if you are not doing well or get worse. Document Released: 12/06/2004 Document Revised: 04/29/2012 Document Reviewed: 05/07/2011 [facility] Patient Information 2015 [place], [place]. This information is not intended to replace advice given to you by your health care provider. Make sure you discuss any questions you have with your health care provider.

## 2014-06-12 NOTE — ED Provider Notes (Signed)
  Chief Complaint   Chief Complaint  Patient presents with  . Wound Check    History of Present Illness   Joel Rivera is a 68-year-old male who had a sebaceous cyst on his upper back, just at the base of the neck which had become infected and was incised and drained on July 25, 7 days ago. This was packed. His wife have been removing the packing and culture grew out Pseudomonas and gram-positive cocci. Cipro was added to his regimen it still little bit sore. He denies any fever or chills. Comes in today to have the remainder the packing removed.  Review of Systems   Other than as noted above, the patient denies any of the following symptoms: Systemic:  No fever, chills or sweats. Skin:  No rash or itching.  [place]   Past medical history, family history, social history, meds, and allergies were reviewed. He has diabetes and hypertension. He is on multiple medication including chlorthalidone, furosemide, Lantus, Procardia XL, Prilosec, pravastatin, and Monopril.  Physical Examination     Vital signs:  BP 137/74  Pulse 90  Temp(Src) 98.2 F (36.8 C) (Oral)  SpO2 96% Skin:  He has an incision on his upper back which has packing in it. There's no surrounding erythema, swelling, or induration.  There was no crepitus, necrosis, ecchymosis, or herrhagic bullae. Skin exam was otherwise normal.  No rash. Ext:  Distal pulses were full, patient has full ROM of all joints.  Course in Urgent [place]   The dressing was removed and the packing was removed. The wound cavity appears clear. Some serosanguineous fluid was drained out. The wound was cleansed with saline and antibiotic ointment was applied followed by a sterile dressing.  Assessment   The encounter diagnosis was Infected sebaceous cyst.  I told the patient to keep a watch over this and if it did seem to be recurring to get in to see a surgeon for definitive removal.  Plan     1.  Meds:  The following meds were prescribed:    Discharge Medication List as of 06/12/2014  3:01 PM      2.  Patient Education/Counseling:  The patient was given appropriate handouts, self care instructions, and instructed in symptomatic relief. Instructed to finish up his antibiotics and instructed in wound care.    3.  Follow up:  The patient was instructed to return if this becomes worse in any way.       Harden Mo, MD 06/12/14 2100

## 2014-07-13 NOTE — ED Notes (Signed)
Info  For referrell for   Dr  Zella Richer  Given to pts      Wife     As   Directed

## 2014-10-01 ENCOUNTER — Emergency Department (HOSPITAL_COMMUNITY)
Admission: EM | Admit: 2014-10-01 | Discharge: 2014-10-01 | Disposition: A | Discharge status: Home / Self Care | Payer: Medicare Other | Attending: Emergency Medicine | Admitting: Emergency Medicine

## 2014-10-01 ENCOUNTER — Encounter (HOSPITAL_COMMUNITY): Payer: Self-pay | Admitting: *Deleted

## 2014-10-01 DIAGNOSIS — Z7982 Long term (current) use of aspirin: Secondary | ICD-10-CM | POA: Insufficient documentation

## 2014-10-01 DIAGNOSIS — E119 Type 2 diabetes mellitus without complications: Secondary | ICD-10-CM | POA: Insufficient documentation

## 2014-10-01 DIAGNOSIS — Z79899 Other long term (current) drug therapy: Secondary | ICD-10-CM | POA: Insufficient documentation

## 2014-10-01 DIAGNOSIS — Z87891 Personal history of nicotine dependence: Secondary | ICD-10-CM | POA: Diagnosis not present

## 2014-10-01 DIAGNOSIS — L723 Sebaceous cyst: Secondary | ICD-10-CM

## 2014-10-01 DIAGNOSIS — Z794 Long term (current) use of insulin: Secondary | ICD-10-CM | POA: Diagnosis not present

## 2014-10-01 DIAGNOSIS — L729 Follicular cyst of the skin and subcutaneous tissue, unspecified: Secondary | ICD-10-CM | POA: Diagnosis present

## 2014-10-01 DIAGNOSIS — E669 Obesity, unspecified: Secondary | ICD-10-CM | POA: Diagnosis not present

## 2014-10-01 DIAGNOSIS — Z8709 Personal history of other diseases of the respiratory system: Secondary | ICD-10-CM | POA: Diagnosis not present

## 2014-10-01 DIAGNOSIS — I10 Essential (primary) hypertension: Secondary | ICD-10-CM | POA: Insufficient documentation

## 2014-10-01 DIAGNOSIS — Z7951 Long term (current) use of inhaled steroids: Secondary | ICD-10-CM | POA: Insufficient documentation

## 2014-10-01 NOTE — Discharge Instructions (Signed)
Epidermal Cyst An epidermal cyst is usually a small, painless lump under the skin. Cysts often occur on the face, neck, stomach, chest, or genitals. The cyst may be filled with a bad smelling paste. Do not pop your cyst. Popping the cyst can cause pain and puffiness (swelling). HOME CARE   Only take medicines as told by your doctor.  Take your medicine (antibiotics) as told. Finish it even if you start to feel better. GET HELP RIGHT AWAY IF:  Your cyst is tender, red, or puffy.  You are not getting better, or you are getting worse.  You have any questions or concerns. MAKE SURE YOU:  Understand these instructions.  Will watch your condition.  Will get help right away if you are not doing well or get worse. Document Released: 12/06/2004 Document Revised: 04/29/2012 Document Reviewed: 05/07/2011 [employer] Patient Information 2015 [employer]. This information is not intended to replace advice given to you by your health care provider. Make sure you discuss any questions you have with your health care provider.  

## 2014-10-01 NOTE — ED Provider Notes (Signed)
CSN: [id number]     Arrival date & time 10/01/14  1329 History  This chart was scribed for non-physician practitioner, Starlyn Skeans, PA-C,working with Blanchie Dessert, MD, by Marlowe Kays, ED Scribe. This patient was seen in room TR09C/TR09C and the patient's care was started at 2:30 PM.  Chief Complaint  Patient presents with  . Abscess   Patient is a 68 y.o. male presenting with abscess. The history is provided by the patient. No language interpreter was used.  Abscess Associated symptoms: no fever, no nausea and no vomiting     HPI Comments:  Joel Rivera is a 68 y.o. male with PMH of DM, HTN and recurrent abscesses who presents to the Emergency Department complaining of two cysts to the posterior head that he noticed several months ago. He reports moderate pain of the areas. He states he was seen at the [facility] hospital to try and find out why he got these recurrently with no specific diagnosis. He has not done anything to treat the symptoms. Denies modifying factors. He denies fever, chills, nausea or vomiting.   Past Medical History  Diagnosis Date  . Diabetes mellitus without complication   . Hypertension   . Sinus infection     frequent  . Recurrent boils     abceses  . Obesity    Past Surgical History  Procedure Laterality Date  . Cyst excision Left 2008    inguinal area   Family History  Problem Relation Age of Onset  . Urinary tract infection Mother   . Pneumonia Mother   . Aneurysm Father    History  Substance Use Topics  . Smoking status: Former Smoker    Quit date: 11/01/1993  . Smokeless tobacco: Not on file  . Alcohol Use: No    Review of Systems  Constitutional: Negative for fever and chills.  Gastrointestinal: Negative for nausea and vomiting.  Skin:       Cysts to posterior head.  All other systems reviewed and are negative.   Allergies  Review of patient's allergies indicates no known allergies.  Home Medications   Prior to Admission  medications   Medication Sig Start Date End Date Taking? Authorizing Provider  aspirin 81 MG tablet Take 81 mg by mouth daily.   Yes Historical Provider, MD  Azelastine HCl 0.15 % SOLN 2 sprays in each nostril BID for allergies 02/13/13  Yes Harden Mo, MD  chlorthalidone (HYGROTON) 50 MG tablet Take 25 mg by mouth daily.   Yes Historical Provider, MD  fluticasone (FLONASE) 50 MCG/ACT nasal spray Place 2 sprays into the nose daily.   Yes Historical Provider, MD  insulin glargine (LANTUS) 100 UNIT/ML injection Inject 45 Units into the skin at bedtime.   Yes Historical Provider, MD  JARDIANCE 25 MG TABS tablet Take 1 tablet by mouth daily. 09/15/14  Yes Historical Provider, MD  loratadine (CLARITIN) 10 MG tablet Take 10 mg by mouth daily.   Yes Historical Provider, MD  losartan (COZAAR) 25 MG tablet Take 1 tablet by mouth daily. 09/14/14  Yes Historical Provider, MD  NIFEdipine (PROCARDIA XL/ADALAT-CC) 60 MG 24 hr tablet Take 30 mg by mouth daily.    Yes Historical Provider, MD  omeprazole (PRILOSEC) 20 MG capsule Take 20 mg by mouth daily.    Yes Historical Provider, MD  amoxicillin (AMOXIL) 500 MG capsule Take 2 capsules (1,000 mg total) by mouth 3 (three) times daily. Patient not taking: Reported on 10/01/2014 02/13/13   Harden Mo,  MD  cephALEXin (KEFLEX) 500 MG capsule Take 1 capsule (500 mg total) by mouth 2 (two) times daily. Patient not taking: Reported on 10/01/2014 06/05/14   Waldemar Dickens, MD  ciprofloxacin (CIPRO) 500 MG tablet Take 1 tablet (500 mg total) by mouth 2 (two) times daily. Patient not taking: Reported on 10/01/2014 06/08/14   Waldemar Dickens, MD  doxycycline (VIBRAMYCIN) 100 MG capsule Take 1 capsule (100 mg total) by mouth 2 (two) times daily. Patient not taking: Reported on 10/01/2014 06/02/13   Billy Fischer, MD  meloxicam (MOBIC) 7.5 MG tablet Take 1 tablet (7.5 mg total) by mouth daily. Patient not taking: Reported on 10/01/2014 02/13/13   Harden Mo, MD   methocarbamol (ROBAXIN) 500 MG tablet Take 1 tablet (500 mg total) by mouth 3 (three) times daily. Patient not taking: Reported on 10/01/2014 02/13/13   Harden Mo, MD  pravastatin (PRAVACHOL) 10 MG tablet Take 5 mg by mouth every other day.     Historical Provider, MD  sulfamethoxazole-trimethoprim (SEPTRA DS) 800-160 MG per tablet Take 1 tablet by mouth every 12 (twelve) hours. Patient not taking: Reported on 10/01/2014 11/01/12   Montine Circle, PA-C  traMADol (ULTRAM) 50 MG tablet Take 1 tablet (50 mg total) by mouth every 6 (six) hours as needed. Patient not taking: Reported on 10/01/2014 06/05/14   Waldemar Dickens, MD   Triage Vitals: BP 125/97 mmHg  Pulse 73  Temp(Src) 97.9 F (36.6 C) (Oral)  Resp 12  Ht 5\' 8"  (1.727 m)  Wt 271 lb (122.925 kg)  BMI 41.22 kg/m2  SpO2 94% Physical Exam  Constitutional: He is oriented to person, place, and time. He appears well-developed and well-nourished.  HENT:  Head: Normocephalic and atraumatic.  Eyes: EOM are normal.  Neck: Normal range of motion.  Cardiovascular: Normal rate, regular rhythm and normal heart sounds.  Exam reveals no gallop and no friction rub.   No murmur heard. Pulmonary/Chest: Effort normal and breath sounds normal. No respiratory distress. He has no wheezes. He has no rales.  Musculoskeletal: Normal range of motion.  Neurological: He is alert and oriented to person, place, and time.  Skin: Skin is warm and dry.  5 x 4 cm round, firm, indurated nodule with central opening that is tender to palpation. 3 cm below is a 1 x 1 cm that is not as tender to palpation as the one above.  Psychiatric: He has a normal mood and affect. His behavior is normal.  Nursing note and vitals reviewed.   ED Course  Procedures (including critical care time) DIAGNOSTIC STUDIES:   COORDINATION OF CARE: 2:37 PM- Will speak with Dr. Maryan Rued and refer to dermatology. Pt verbalizes understanding and agrees to plan.  Medications - No  data to display  Labs Review Labs Reviewed - No data to display  Imaging Review No results found.   EKG Interpretation None      MDM   Final diagnoses:  Sebaceous cyst   Patient is a 68 year old male who presents to the emergency room with sebaceous cysts to the occiput. There are no evidence of any signs of infection at this time. There is no erythema warmth and redness or discharge from the sebaceous cysts. I am not inclined to perform incision and drainage at this time. I will refer the patient to dermatology for further evaluation. Patient is to return for warmth, redness, fever, nausea, vomiting, or any other concerning symptoms. Patient states understanding and agreement at this time.  I have suggested exfoliation and warm compresses. Patient states understanding and agreement at this point. I've spoken with Dr. Maryan Rued who agrees with the above plan and treatment. Patient is stable for discharge.  I personally performed the services described in this documentation, which was scribed in my presence. The recorded information has been reviewed and is accurate.    Cherylann Parr, PA-C 10/01/14 1452  Blanchie Dessert, MD 10/01/14 [phone]

## 2014-10-01 NOTE — ED Notes (Signed)
Pt given several resources for [facility] dermatologists.

## 2014-10-01 NOTE — ED Notes (Signed)
Reports having cysts to back of his head for extended amount of time, thinks it needs to be drained and reports hx of multiple abscess/cysts this year. Denies fever or chills. No acute distress noted at triage.

## 2014-11-15 DIAGNOSIS — I2510 Atherosclerotic heart disease of native coronary artery without angina pectoris: Secondary | ICD-10-CM | POA: Diagnosis not present

## 2014-11-15 DIAGNOSIS — E118 Type 2 diabetes mellitus with unspecified complications: Secondary | ICD-10-CM | POA: Diagnosis not present

## 2014-11-15 DIAGNOSIS — I10 Essential (primary) hypertension: Secondary | ICD-10-CM | POA: Diagnosis not present

## 2014-11-15 DIAGNOSIS — G588 Other specified mononeuropathies: Secondary | ICD-10-CM | POA: Diagnosis not present

## 2014-11-17 DIAGNOSIS — I10 Essential (primary) hypertension: Secondary | ICD-10-CM | POA: Diagnosis not present

## 2014-11-17 DIAGNOSIS — E118 Type 2 diabetes mellitus with unspecified complications: Secondary | ICD-10-CM | POA: Diagnosis not present

## 2015-02-14 DIAGNOSIS — E118 Type 2 diabetes mellitus with unspecified complications: Secondary | ICD-10-CM | POA: Diagnosis not present

## 2015-02-14 DIAGNOSIS — I10 Essential (primary) hypertension: Secondary | ICD-10-CM | POA: Diagnosis not present

## 2015-02-14 DIAGNOSIS — E119 Type 2 diabetes mellitus without complications: Secondary | ICD-10-CM | POA: Diagnosis not present

## 2015-02-14 DIAGNOSIS — Z125 Encounter for screening for malignant neoplasm of prostate: Secondary | ICD-10-CM | POA: Diagnosis not present

## 2015-02-16 DIAGNOSIS — I10 Essential (primary) hypertension: Secondary | ICD-10-CM | POA: Diagnosis not present

## 2015-02-16 DIAGNOSIS — E119 Type 2 diabetes mellitus without complications: Secondary | ICD-10-CM | POA: Diagnosis not present

## 2015-02-16 DIAGNOSIS — G4733 Obstructive sleep apnea (adult) (pediatric): Secondary | ICD-10-CM | POA: Diagnosis not present

## 2015-02-16 DIAGNOSIS — N529 Male erectile dysfunction, unspecified: Secondary | ICD-10-CM | POA: Diagnosis not present

## 2015-03-16 ENCOUNTER — Ambulatory Visit (INDEPENDENT_AMBULATORY_CARE_PROVIDER_SITE_OTHER): Payer: Medicare Other | Admitting: Neurology

## 2015-03-16 ENCOUNTER — Encounter: Payer: Self-pay | Admitting: Neurology

## 2015-03-16 VITALS — BP 137/68 | HR 87 | Resp 18 | Ht 68.11 in | Wt 281.0 lb

## 2015-03-16 DIAGNOSIS — G4733 Obstructive sleep apnea (adult) (pediatric): Secondary | ICD-10-CM | POA: Diagnosis not present

## 2015-03-16 DIAGNOSIS — F40240 Claustrophobia: Secondary | ICD-10-CM | POA: Diagnosis not present

## 2015-03-16 DIAGNOSIS — F4312 Post-traumatic stress disorder, chronic: Secondary | ICD-10-CM

## 2015-03-16 HISTORY — DX: Post-traumatic stress disorder, chronic: F43.12

## 2015-03-16 HISTORY — DX: Obstructive sleep apnea (adult) (pediatric): G47.33

## 2015-03-16 HISTORY — DX: Claustrophobia: F40.240

## 2015-03-16 NOTE — Progress Notes (Signed)
SLEEP MEDICINE CLINIC   Provider:  Larey Seat, M D  Referring Provider: Jani Gravel, MD Primary Care Physician:  Default, Provider, MD  Chief Complaint  Patient presents with  . sleep consult    rm 10, new pt, alone    HPI:  Joel Rivera is a 69 y.o. afro [place] male, married  seen here as a referral  from Dr. Maudie Mercury for  "Sleep apnea evaluation and Treatment ".   Joel Rivera is a 69 year old right-handed gentleman who is presenting here today with a history of snoring and fragmented sleep. He states that his wife has witnessed him to snore loudly. He reports he doesn't  wake up in the middle of the night from his own snoring or choking. He actually feels that he sleeps quite okay?. 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 The PTSD has interfered with his ability to treat sleep apnea when was originally diagnosed. By now he states he has been through 30 for sleep test he was tested twice at [facility] but states that he had no follow-up. The [facility] gave him also a home sleep test, and he states that he had no follow-up from that. The patient has a long list of current medications was I have reviewed including his list of allergies.  He carries a diagnosis of  Obesity , diabetes mellitus type 2, hypertension, coronary artery disease, he has allergic rhinitis, PTSD and renal insufficiency. He was recently placed on Lexapro he states this has helped with some of the PTSD it has not helped with sleep.   The patient  Goes to bed, describes his bedroom is cool, quiet, dark and he shares that this is wife. He goes to bed at about 10:50 PM, sometimes he has a hard time cooling down or quieting down because his mind is racing. This is attributed to PTSD. His wife has sometimes noticed sleep hallucinations. He is  not aware of any sleep talking, sometimes he wakes up out of the dream sometimes with a flashback. He rises in the morning at about 5:30 he wakes up spontaneously does not rely on an alarm. His average sleep time is about 6 hours at night he usually sleeps is having to go to the bathroom at night. His never woken by headaches or pains. He regularly breakfasts for the control this diabetes. 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  Review of Systems: Out of a complete 14 system review, the patient complains of only the following symptoms, and all other reviewed systems are negative. He endorsed the Epworth sleepiness score at 4 points and the fatigue severity score at 25 points.  On lexapro.    History   Social History  . Marital Status: Married    Spouse Name: N/A  . Number of Children: N/A  . Years of Education: N/A   Occupational History  . Not on file.   Social History Main Topics  . Smoking status: Former Smoker    Quit date: 11/01/1988  . Smokeless tobacco: Not on file  . Alcohol Use: No  .  Drug Use: No  . Sexual Activity: Yes    Birth Control/ Protection: Condom   Other Topics Concern  . Not on file   Social History Narrative   No caffeine.    Family History  Problem Relation Age of Onset  . Urinary tract infection Mother   . Pneumonia Mother   . Aneurysm Father     Past Medical History  Diagnosis Date  . Diabetes mellitus without complication   . Hypertension   . Sinus infection     frequent  . Recurrent boils     abceses  . Obesity   . Hyperlipidemia   . CAD (coronary artery disease)   . Neuropathy   . CHF (congestive heart failure)   . OSA (obstructive sleep apnea)   . PTSD (post-traumatic stress disorder)   . Agent orange exposure     Past Surgical History  Procedure Laterality Date  . Cyst excision Left  2008    inguinal area  . Hernia repair    . Cardiac catheterization      Current Outpatient Prescriptions  Medication Sig Dispense Refill  . amoxicillin (AMOXIL) 500 MG capsule Take 2 capsules (1,000 mg total) by mouth 3 (three) times daily. 60 capsule 0  . aspirin 81 MG tablet Take 81 mg by mouth daily.    . Azelastine HCl 0.15 % SOLN 2 sprays in each nostril BID for allergies 30 mL 3  . cephALEXin (KEFLEX) 500 MG capsule Take 1 capsule (500 mg total) by mouth 2 (two) times daily. 20 capsule 0  . cetirizine (ZYRTEC) 10 MG tablet Take 10 mg by mouth daily.    . chlorthalidone (HYGROTON) 50 MG tablet Take 25 mg by mouth daily.    . ciprofloxacin (CIPRO) 500 MG tablet Take 1 tablet (500 mg total) by mouth 2 (two) times daily. 20 tablet 0  . citalopram (CELEXA) 20 MG tablet Take 20 mg by mouth daily.    Marland Kitchen doxycycline (VIBRAMYCIN) 100 MG capsule Take 1 capsule (100 mg total) by mouth 2 (two) times daily. 20 capsule 0  . escitalopram (LEXAPRO) 5 MG tablet Take 5 mg by mouth daily.    . fluticasone (FLONASE) 50 MCG/ACT nasal spray Place 2 sprays into the nose daily.    . insulin glargine (LANTUS) 100 UNIT/ML injection Inject 45 Units into the skin at bedtime.    Marland Kitchen JARDIANCE 25 MG TABS tablet Take 1 tablet by mouth daily.    Marland Kitchen l-methylfolate-B6-B12 (METANX) 3-35-2 MG TABS Take 1 tablet by mouth daily.    Marland Kitchen loratadine (CLARITIN) 10 MG tablet Take 10 mg by mouth daily.    Marland Kitchen losartan (COZAAR) 25 MG tablet Take 1 tablet by mouth daily.    . meloxicam (MOBIC) 7.5 MG tablet Take 1 tablet (7.5 mg total) by mouth daily. 15 tablet 0  . methocarbamol (ROBAXIN) 500 MG tablet Take 1 tablet (500 mg total) by mouth 3 (three) times daily. 30 tablet 0  . NIFEdipine (PROCARDIA XL/ADALAT-CC) 60 MG 24 hr tablet Take 30 mg by mouth daily.     Marland Kitchen omeprazole (PRILOSEC) 20 MG capsule Take 20 mg by mouth daily.     . rosuvastatin (CRESTOR) 20 MG tablet Take 20 mg by mouth daily.    Marland Kitchen sulfamethoxazole-trimethoprim  (SEPTRA DS) 800-160 MG per tablet Take 1 tablet by mouth every 12 (twelve) hours. 20 tablet 0  . traMADol (ULTRAM) 50 MG tablet Take 1 tablet (50 mg total) by mouth every 6 (six) hours as needed.  15 tablet 0   No current facility-administered medications for this visit.    Allergies as of 03/16/2015  . (No Known Allergies)    Vitals: BP 137/68 mmHg  Pulse 87  Resp 18  Ht 5' 8.11" (1.73 m)  Wt 281 lb (127.461 kg)  BMI 42.59 kg/m2 Last Weight:  Wt Readings from Last 1 Encounters:  03/16/15 281 lb (127.461 kg)       Last Height:   Ht Readings from Last 1 Encounters:  03/16/15 5' 8.11" (1.73 m)    Physical exam:  General: The patient is awake, alert and appears not in acute distress. The patient is well groomed. Head: Normocephalic, atraumatic. Neck is supple. Mallampati 4 ,  neck circumference: 18, ROM limited by arthritis. . Nasal airflow unrestricted , TMJ is not evident . Retrognathia is not seen.  Cardiovascular:  Regular rate and rhythm , without  murmurs or carotid bruit, and without distended neck veins. Respiratory: Lungs are clear to auscultation. Skin:  Without evidence of edema, or rash Trunk: BMI is  elevated and patient  has normal posture.  Neurologic exam : The patient is awake and alert, oriented to place and time.   Memory subjective  described as intact. There is a normal attention span & concentration ability. Speech is fluent without dysarthria, dysphonia or aphasia. Mood and affect are appropriate.  Cranial nerves: Pupils are equal and briskly reactive to light. Funduscopic exam without  evidence of pallor or edema.  Extraocular movements  in vertical and horizontal planes intact and without nystagmus. Visual fields by finger perimetry are intact. Hearing to finger rub intact.  Facial sensation intact to fine touch. Facial motor strength is symmetric and tongue and uvula move midline.  Motor exam:Normal tone ,muscle bulk and symmetric strength in all  extremities.  Sensory:  Fine touch, pinprick and vibration were tested in all extremities.  Proprioception is normal.  Coordination: Rapid alternating movements in the fingers/hands is normal.  Finger-to-nose maneuver without evidence of ataxia, dysmetria or tremor.  Gait and station: Patient walks without assistive device,  is able unassisted to climb up to the exam table.  Strength within normal limits. Stance is stable and normal. Tandem gait is fragmented.  Romberg testing is negative.  Deep tendon reflexes: in the  upper and lower extremities are symmetric and intact. He has crepitus over the right shoulder and knee- osteoarthritis.  Babinski maneuver response is downgoing.   Assessment:  After physical and neurologic examination, review of laboratory studies, imaging, neurophysiology testing and pre-existing records, assessment is   Mr. Nutting has multiple risk factors for obstructive sleep apnea including is truncal obesity but his extremities are not obese. He does have an enlarged neck he has a high-grade Mallampati he has also a large tongue. He does not have retrognathia he does not wear any retainers . He has upper and lower dentures. He has been multiple times tested before and was found to have sleep apnea but could not tolerate CPAP therapy.  The intolerance to CPAP therapy comes from his PTSD which is now treated for the first time was Lexapro and previously when he had sleep test he was not yet treated for PTSD or depression at all. It is possible that he is no longer as sensitive, it is also possible that and non-CPAP therapy can be applied. Dental device is will boost the lower jaw forward and open space and the back they work well for those patients with mild sleep apnea that do  not suffer from oxygen desaturations. Some ENT procedures can also help especially as the patient would be a loud snorer but he reports he's not. I do think that his insomnia is related to both PTSD and  some underlying respiratory nocturnal disorder. For this reason I would like him to have a sleep test to know the degree of apnea no and also how much desaturation time is associated with it and what the nadir of oxygen at night as I will ask for a CO2 study as well. If the patient's AHI is over 20 and his scoring is set at 4% I will either treat the same night in the sleep lab or we will meet after the sleep study to discuss what is to do was to be done next.  He has CO AD and thus is at higher risk to have OSA as well. Cardiologist  Pernell Dupre, MD   The patient was advised of the nature of the diagnosed sleep disorder , the treatment options and risks for general a health and wellness arising from not treating the condition. Visit duration was 40  minutes.   Plan:  Treatment plan and additional workup : Attended sleep study with CO2, AHI 20 and score at 3 %.  lexapro increase to 10 mg nightly/ daily.  Please advise tech of PTSD and schedule with male tech.     Asencion Partridge Galina Haddox MD  03/16/2015

## 2015-03-16 NOTE — Patient Instructions (Signed)
Polysomnography (Sleep Studies) Polysomnography (PSG) is a series of tests used for detecting (diagnosing) obstructive sleep apnea and other sleep disorders. The tests measure how some parts of your body are working while you are sleeping. The tests are extensive and expensive. They are done in a sleep lab or hospital, and vary from center to center. Your caregiver may perform other more simple sleep studies and questionnaires before doing more complete and involved testing. Testing may not be covered by insurance. Some of these tests are:  An EEG (Electroencephalogram). This tests your brain waves and stages of sleep.  An EOG (Electrooculogram). This measures the movements of your eyes. It detects periods of REM (rapid eye movement) sleep, which is your dream sleep.  An EKG (Electrocardiogram). This measures your heart rhythm.  EMG (Electromyography). This is a measurement of how the muscles are working in your upper airway and your legs while sleeping.  An oximetry measurement. It measures how much oxygen (air) you are getting while sleeping.  Breathing efforts may be measured. The same test can be interpreted (understood) differently by different caregivers and centers that study sleep.  Studies may be given an apnea/hypopnea index (AHI). This is a number which is found by counting the times of no breathing or under breathing during the night, and relating those numbers to the amount of time spent in bed. When the AHI is greater than 15, the patient is likely to complain of daytime sleepiness. When the AHI is greater than 30, the patient is at increased risk for heart problems and must be followed more closely. Following the AHI also allows you to know how treatment is working. Simple oximetry (tracking the amount of oxygen that is taken in) can be used for screening patients who:  Do not have symptoms (problems) of OSA.  Have a normal Epworth Sleepiness Scale Score.  Have a low pre-test  probability of having OSA.  Have none of the upper airway problems likely to cause apnea.  Oximetry is also used to determine if treatment is effective in patients who showed significant desaturations (not getting enough oxygen) on their home sleep study. One extra measure of safety is to perform additional studies for the person who only snores. This is because no one can predict with absolute certainty who will have OSA. Those who show significant desaturations (not getting enough oxygen) are recommended to have a more detailed sleep study. Document Released: 05/05/2003 Document Revised: 01/21/2012 Document Reviewed: 01/04/2014 Exit[employer] Patient Information 2015 [employer]. This information is not intended to replace advice given to you by your health care provider. Make sure you discuss any questions you have with your health care provider.  

## 2015-03-18 ENCOUNTER — Ambulatory Visit (INDEPENDENT_AMBULATORY_CARE_PROVIDER_SITE_OTHER): Payer: Medicare Other | Admitting: Interventional Cardiology

## 2015-03-18 ENCOUNTER — Encounter: Payer: Self-pay | Admitting: Interventional Cardiology

## 2015-03-18 VITALS — BP 140/60 | HR 74 | Ht 68.0 in | Wt 282.0 lb

## 2015-03-18 DIAGNOSIS — I2510 Atherosclerotic heart disease of native coronary artery without angina pectoris: Secondary | ICD-10-CM | POA: Diagnosis not present

## 2015-03-18 DIAGNOSIS — R0989 Other specified symptoms and signs involving the circulatory and respiratory systems: Secondary | ICD-10-CM | POA: Diagnosis not present

## 2015-03-18 DIAGNOSIS — I739 Peripheral vascular disease, unspecified: Secondary | ICD-10-CM | POA: Insufficient documentation

## 2015-03-18 DIAGNOSIS — R011 Cardiac murmur, unspecified: Secondary | ICD-10-CM | POA: Diagnosis not present

## 2015-03-18 NOTE — Patient Instructions (Signed)
Medication Instructions:  Your physician recommends that you continue on your current medications as directed. Please refer to the Current Medication list given to you today.   Labwork: None   Testing/Procedures: Your physician has requested that you have an echocardiogram. Echocardiography is a painless test that uses sound waves to create images of your heart. It provides your doctor with information about the size and shape of your heart and how well your heart's chambers and valves are working. This procedure takes approximately one hour. There are no restrictions for this procedure.  Your physician has requested that you have a carotid duplex. This test is an ultrasound of the carotid arteries in your neck. It looks at blood flow through these arteries that supply the brain with blood. Allow one hour for this exam. There are no restrictions or special instructions.  Your physician has requested that you have a lower extremity arterial exercise duplex. During this test, exercise and ultrasound are used to evaluate arterial blood flow in the legs. Allow one hour for this exam. There are no restrictions or special instructions.   Follow-Up: Your physician wants you to follow-up in: 1 year or sooner pending results. You will receive a reminder letter in the mail two months in advance. If you don't receive a letter, please call our office to schedule the follow-up appointment.   Any Other Special Instructions Will Be Listed Below (If Applicable).

## 2015-03-18 NOTE — Progress Notes (Signed)
Cardiology Office Note   Date:  03/18/2015   ID:  Joel Rivera, DOB 19-May-1946, MRN [id number]  PCP:  Default, Provider, MD  Cardiologist:   Sinclair Grooms, MD   Chief Complaint  Patient presents with  . Claudication      History of Present Illness: Joel Rivera is a 69 y.o. male who presents for multiple risk factors for vascular disease including type 2 diabetes, hypertension, obesity, sleep apnea, hyperlipidemia, nonobstructive coronary disease, diastolic heart failure, and agent orange exposure.  The patient denies chest discomfort. He is referred by Dr. Maudie Mercury because of calf discomfort with ambulation. This is been present for quite some time. He carries a presumed diagnosis of claudication. Coronary angiography performed in 2003 demonstrated moderate coronary disease that was distal and felt best treated with medication. He has dyspnea on exertion. Last LV assessment revealed systolic function to be normal. He denies orthopnea and PND. He has not had syncope.   Past Medical History  Diagnosis Date  . Diabetes mellitus without complication   . Hypertension   . Sinus infection     frequent  . Recurrent boils     abceses  . Obesity   . Hyperlipidemia   . CAD (coronary artery disease)   . Neuropathy   . CHF (congestive heart failure)   . OSA (obstructive sleep apnea)   . PTSD (post-traumatic stress disorder)   . Agent orange exposure   . OSA (obstructive sleep apnea) 03/16/2015  . Chronic post-traumatic stress disorder (PTSD) 03/16/2015  . Claustrophobia 03/16/2015    Past Surgical History  Procedure Laterality Date  . Cyst excision Left 2008    inguinal area  . Hernia repair    . Cardiac catheterization       Current Outpatient Prescriptions  Medication Sig Dispense Refill  . aspirin 81 MG tablet Take 81 mg by mouth daily.    . Azelastine HCl 0.15 % SOLN 2 sprays in each nostril BID for allergies 30 mL 3  . cetirizine (ZYRTEC) 10 MG tablet Take 10 mg by mouth  daily.    . chlorthalidone (HYGROTON) 50 MG tablet Take 25 mg by mouth daily.    . cholecalciferol (VITAMIN D) 1000 UNITS tablet Take 1,000 Units by mouth daily.    . citalopram (CELEXA) 20 MG tablet Take 20 mg by mouth daily.    Marland Kitchen escitalopram (LEXAPRO) 5 MG tablet Take 10 mg by mouth daily.    . fluticasone (FLONASE) 50 MCG/ACT nasal spray Place 2 sprays into the nose daily.    . insulin glargine (LANTUS) 100 UNIT/ML injection Inject 45 Units into the skin at bedtime.    Marland Kitchen JARDIANCE 25 MG TABS tablet Take 1 tablet by mouth daily.    Marland Kitchen l-methylfolate-B6-B12 (METANX) 3-35-2 MG TABS Take 1 tablet by mouth daily.    Marland Kitchen loratadine (CLARITIN) 10 MG tablet Take 10 mg by mouth daily.    Marland Kitchen losartan (COZAAR) 25 MG tablet Take 1 tablet by mouth daily.    . meloxicam (MOBIC) 7.5 MG tablet Take 1 tablet (7.5 mg total) by mouth daily. 15 tablet 0  . metoprolol tartrate (LOPRESSOR) 25 MG tablet Take 25 mg by mouth 2 (two) times daily.    Marland Kitchen NIFEdipine (PROCARDIA XL/ADALAT-CC) 60 MG 24 hr tablet Take 30 mg by mouth daily.     . pantoprazole (PROTONIX) 20 MG tablet Take 20 mg by mouth 3 (three) times daily.    . rosuvastatin (CRESTOR) 20 MG tablet Take  20 mg by mouth daily.     No current facility-administered medications for this visit.    Allergies:   Review of patient's allergies indicates no known allergies.    Social History:  The patient  reports that he quit smoking about 26 years ago. He does not have any smokeless tobacco history on file. He reports that he does not drink alcohol or use illicit drugs.   Family History:  The patient's family history includes Aneurysm in his father; Pneumonia in his mother; Urinary tract infection in his mother.    ROS:  Please see the history of present illness.   Otherwise, review of systems are positive for none.   All other systems are reviewed and negative.    PHYSICAL EXAM: VS:  BP 140/60 mmHg  Pulse 74  Ht 5\' 8"  (1.727 m)  Wt 282 lb (127.914 kg)  BMI  42.89 kg/m2 , BMI Body mass index is 42.89 kg/(m^2). GEN: Well nourished, well developed, in no acute distress HEENT: normal Neck: no JVD, carotid bruits, or masses Cardiac: RRR; 2/6 systolic murmur at the right upper sternal border. An S4 gallop is present.There are no rubs and no edema . Respiratory:  clear to auscultation bilaterally, normal work of breathing GI: soft, nontender, nondistended, + BS MS: no deformity or atrophy Skin: warm and dry, no rash Neuro:  Strength and sensation are intact Psych: euthymic mood, full affect   EKG:  EKG is ordered today. The ekg ordered today demonstrates prominent voltage, with PACs, normal sinus rhythm, and nonspecific ST abnormality.   Recent Labs: No results found for requested labs within last 365 days.    Lipid Panel No results found for: CHOL, TRIG, HDL, CHOLHDL, VLDL, LDLCALC, LDLDIRECT    Wt Readings from Last 3 Encounters:  03/18/15 282 lb (127.914 kg)  03/16/15 281 lb (127.461 kg)  10/01/14 271 lb (122.925 kg)      Other studies Reviewed: Additional studies/ records that were reviewed today include: . Review of the above records demonstrates:    ASSESSMENT AND PLAN:  Coronary Artery Disease, without angina  Claudication: Right calf pain with ambulation  Systolic murmur: Systolic murmur is new  Left carotid bruit: I am concerned there is obstructive carotid disease.     Current medicines are reviewed at length with the patient today.  The patient has no concerns regarding medicines.  The following changes have been made:  no change  Labs/ tests ordered today include:   Orders Placed This Encounter  Procedures  . EKG 12-Lead  . Echocardiogram   also planning to perform lower extremity bilateral Doppler and duplex. While also have bilateral carotid duplex.   Disposition:   FU with HS in 1 year when necessary  Signed, Sinclair Grooms, MD  03/18/2015 10:50 AM    [address], [place], [place]  [address] Phone: [phone]; Fax: [phone]

## 2015-03-29 ENCOUNTER — Ambulatory Visit (HOSPITAL_BASED_OUTPATIENT_CLINIC_OR_DEPARTMENT_OTHER): Payer: Medicare Other

## 2015-03-29 ENCOUNTER — Ambulatory Visit (HOSPITAL_COMMUNITY): Payer: Medicare Other

## 2015-03-29 ENCOUNTER — Other Ambulatory Visit: Payer: Self-pay

## 2015-03-29 ENCOUNTER — Other Ambulatory Visit: Payer: Self-pay | Admitting: Interventional Cardiology

## 2015-03-29 ENCOUNTER — Ambulatory Visit (HOSPITAL_COMMUNITY): Payer: Medicare Other | Attending: Cardiovascular Disease

## 2015-03-29 DIAGNOSIS — R011 Cardiac murmur, unspecified: Secondary | ICD-10-CM | POA: Diagnosis not present

## 2015-03-29 DIAGNOSIS — I739 Peripheral vascular disease, unspecified: Secondary | ICD-10-CM

## 2015-03-29 DIAGNOSIS — I2510 Atherosclerotic heart disease of native coronary artery without angina pectoris: Secondary | ICD-10-CM | POA: Insufficient documentation

## 2015-03-29 DIAGNOSIS — E119 Type 2 diabetes mellitus without complications: Secondary | ICD-10-CM | POA: Insufficient documentation

## 2015-03-29 DIAGNOSIS — I10 Essential (primary) hypertension: Secondary | ICD-10-CM | POA: Insufficient documentation

## 2015-03-29 DIAGNOSIS — E785 Hyperlipidemia, unspecified: Secondary | ICD-10-CM | POA: Diagnosis not present

## 2015-03-29 DIAGNOSIS — R0989 Other specified symptoms and signs involving the circulatory and respiratory systems: Secondary | ICD-10-CM

## 2015-04-01 ENCOUNTER — Telehealth: Payer: Self-pay

## 2015-04-01 DIAGNOSIS — I6523 Occlusion and stenosis of bilateral carotid arteries: Secondary | ICD-10-CM

## 2015-04-01 MED ORDER — LOSARTAN POTASSIUM 50 MG PO TABS: 50.0000 mg | ORAL_TABLET | Freq: Every day | ORAL | Status: DC

## 2015-04-01 NOTE — Telephone Encounter (Signed)
-----   Message from Belva Crome, MD sent at 03/29/2015  6:38 PM EDT ----- The heart muscle is thicker than normal. The heart squeezes normal. We need better blood pressure control to prevent further thickening of the heart. Increase losartan to 50 mg per day. Office visit with me in one month with a basic metabolic panel

## 2015-04-01 NOTE — Telephone Encounter (Signed)
Pt aware of echo, carotid, and le dopp results. The heart muscle is thicker than normal. The heart squeezes normal. We need better blood pressure control to prevent further thickening of the heart. Increase losartan to 50 mg per day. Office visit with me in one month with a basic metabolic panel  rx sent to pt pharmacy. F/u appt with Dr.Smith scheduled for 6/28   Let him know that he has bilateral LE blockages that account for leg pain with walking. The left seems worse than the rifght. My note says that right calf is most symptomatic.He needs to see Dr. Gwenlyn Found or Fletcher Anon. Consult scheduled with Dr.Arida for 6/21 @ 9am

## 2015-04-25 ENCOUNTER — Ambulatory Visit: Payer: Self-pay | Admitting: Interventional Cardiology

## 2015-04-28 ENCOUNTER — Encounter: Payer: Self-pay | Admitting: *Deleted

## 2015-04-30 ENCOUNTER — Encounter (HOSPITAL_COMMUNITY): Payer: Self-pay

## 2015-04-30 ENCOUNTER — Emergency Department (INDEPENDENT_AMBULATORY_CARE_PROVIDER_SITE_OTHER)
Admission: EM | Admit: 2015-04-30 | Discharge: 2015-04-30 | Disposition: A | Discharge status: Home / Self Care | Payer: Medicare Other | Source: Home / Self Care | Attending: Family Medicine | Admitting: Family Medicine

## 2015-04-30 DIAGNOSIS — J069 Acute upper respiratory infection, unspecified: Secondary | ICD-10-CM

## 2015-04-30 DIAGNOSIS — H6122 Impacted cerumen, left ear: Secondary | ICD-10-CM

## 2015-04-30 MED ORDER — CARBAMIDE PEROXIDE 6.5 % OT SOLN: 5.0000 [drp] | Freq: Two times a day (BID) | OTIC | Status: DC

## 2015-04-30 NOTE — ED Notes (Signed)
C/o sinus congestion and drainage

## 2015-04-30 NOTE — ED Provider Notes (Signed)
CSN: [id number]     Arrival date & time 04/30/15  1643 History   None    Chief Complaint  Patient presents with  . Facial Pain   (Consider location/radiation/quality/duration/timing/severity/associated sxs/prior Treatment) HPI  69 yo M with URI symptoms past 3-4 days.  Has started having congestion and "thumping" sound in Left ear for same time period.  Describes nasal drainage and mild sinus congestion.  No sore throat.  No ear pain or itching. Has tried using mineral oil without relief in Left ear, decreased hearing as well this side.  No fevers or chills.  No cough.    Past Medical History  Diagnosis Date  . Diabetes mellitus without complication   . Hypertension   . Sinus infection     frequent  . Recurrent boils     abceses  . Obesity   . Hyperlipidemia   . CAD (coronary artery disease)   . Neuropathy   . CHF (congestive heart failure)   . OSA (obstructive sleep apnea)   . PTSD (post-traumatic stress disorder)   . Agent orange exposure   . OSA (obstructive sleep apnea) 03/16/2015  . Chronic post-traumatic stress disorder (PTSD) 03/16/2015  . Claustrophobia 03/16/2015   Past Surgical History  Procedure Laterality Date  . Cyst excision Left 2008    inguinal area  . Hernia repair    . Cardiac catheterization     Family History  Problem Relation Age of Onset  . Urinary tract infection Mother   . Pneumonia Mother   . Aneurysm Father    History  Substance Use Topics  . Smoking status: Former Smoker    Quit date: 11/01/1988  . Smokeless tobacco: Not on file  . Alcohol Use: No    Review of Systems  Allergies  Review of patient's allergies indicates no known allergies.  Home Medications   Prior to Admission medications   Medication Sig Start Date End Date Taking? Authorizing Provider  aspirin 81 MG tablet Take 81 mg by mouth daily.    Historical Provider, MD  Azelastine HCl 0.15 % SOLN 2 sprays in each nostril BID for allergies 02/13/13   Harden Mo, MD   carbamide peroxide [facility]) 6.5 % otic solution Place 5 drops into the left ear 2 (two) times daily. 04/30/15   Alveda Reasons, MD  cetirizine (ZYRTEC) 10 MG tablet Take 10 mg by mouth daily.    Historical Provider, MD  chlorthalidone (HYGROTON) 50 MG tablet Take 25 mg by mouth daily.    Historical Provider, MD  cholecalciferol (VITAMIN D) 1000 UNITS tablet Take 1,000 Units by mouth daily.    Historical Provider, MD  citalopram (CELEXA) 20 MG tablet Take 20 mg by mouth daily.    Historical Provider, MD  escitalopram (LEXAPRO) 5 MG tablet Take 10 mg by mouth daily.    Historical Provider, MD  fluticasone (FLONASE) 50 MCG/ACT nasal spray Place 2 sprays into the nose daily.    Historical Provider, MD  insulin glargine (LANTUS) 100 UNIT/ML injection Inject 45 Units into the skin at bedtime.    Historical Provider, MD  JARDIANCE 25 MG TABS tablet Take 1 tablet by mouth daily. 09/15/14   Historical Provider, MD  l-methylfolate-B6-B12 (METANX) 3-35-2 MG TABS Take 1 tablet by mouth daily.    Historical Provider, MD  loratadine (CLARITIN) 10 MG tablet Take 10 mg by mouth daily.    Historical Provider, MD  losartan (COZAAR) 50 MG tablet Take 1 tablet (50 mg total) by mouth daily.  04/01/15   Belva Crome, MD  meloxicam (MOBIC) 7.5 MG tablet Take 1 tablet (7.5 mg total) by mouth daily. 02/13/13   Harden Mo, MD  metoprolol tartrate (LOPRESSOR) 25 MG tablet Take 25 mg by mouth 2 (two) times daily.    Historical Provider, MD  NIFEdipine (PROCARDIA XL/ADALAT-CC) 60 MG 24 hr tablet Take 30 mg by mouth daily.     Historical Provider, MD  pantoprazole (PROTONIX) 20 MG tablet Take 20 mg by mouth 3 (three) times daily.    Historical Provider, MD  rosuvastatin (CRESTOR) 20 MG tablet Take 20 mg by mouth daily.    Historical Provider, MD   BP 151/74 mmHg  Pulse 70  Temp(Src) 97 F (36.1 C) (Oral)  Resp 16  SpO2 96% Physical Exam  BP 151/74 mmHg  Pulse 70  Temp(Src) 97 F (36.1 C) (Oral)  Resp 16  SpO2  96% Gen:  Patient sitting on exam table, appears stated age in no acute distress Head: Normocephalic atraumatic Eyes: EOMI, PERRL, sclera and conjunctiva non-erythematous Ears:  Canal and TM clear Right side.  Cerumen impaction  Nose:  Nasal turbinates grossly enlarged bilaterally. Some exudates noted. Tender to palpation of maxillary sinus  Mouth: Mucosa membranes moist. Tonsils +2, nonenlarged, non-erythematous. Neck: No cervical lymphadenopathy noted Heart:  RRR, no murmurs auscultated. Pulm:  Clear to auscultation bilaterally with good air movement.  No wheezes or rales noted.      ED Course  Procedures (including critical care time) Labs Review Labs Reviewed - No data to display  Imaging Review No results found.   MDM   1. Cerumen impaction, left   2. URI (upper respiratory infection)    - nasal saline to treat symptomatically URI.   - Debrox for Left cerumen impaction.   - FU with PCP in 2 weeks to ensure impaction.      Alveda Reasons, MD 04/30/15 1754

## 2015-04-30 NOTE — Discharge Instructions (Signed)
You have an ear wax impaction in your left ear.  Use the debrox 4-5 drops 3-4 times a day for the next 10 days.    Use nasal saline for your nasal congestion.    Follow up with your primary care doctor in about 2 weeks to make sure your ear is better.

## 2015-05-03 ENCOUNTER — Encounter: Payer: Self-pay | Admitting: Cardiovascular Disease

## 2015-05-03 ENCOUNTER — Ambulatory Visit (INDEPENDENT_AMBULATORY_CARE_PROVIDER_SITE_OTHER): Payer: Medicare Other | Admitting: Cardiovascular Disease

## 2015-05-03 VITALS — BP 124/70 | HR 64 | Ht 68.0 in | Wt 284.1 lb

## 2015-05-03 DIAGNOSIS — I739 Peripheral vascular disease, unspecified: Secondary | ICD-10-CM

## 2015-05-03 MED ORDER — CILOSTAZOL 100 MG PO TABS: 100.0000 mg | ORAL_TABLET | Freq: Two times a day (BID) | ORAL | Status: DC

## 2015-05-03 NOTE — Progress Notes (Signed)
Cardiology Office Note   Date:  05/03/2015   ID:  Joel Rivera., DOB November 15, 1945, MRN [id number]  PCP:  Dr. Maudie Mercury Cardiologist:   Dr. Tamala Julian      History of Present Illness: Joel Rivera. is a 69 y.o. male who was referred by Dr. Tamala Julian for evaluation and management of peripheral arterial disease. He has known history of type 2 diabetes, hypertension, chronic kidney disease, obesity, sleep apnea, hyperlipidemia, nonobstructive coronary disease, diastolic heart failure, and agent orange exposure. The patient complains of bilateral calf claudication which is worse on the right side than the left side. This happens if he is walking in the mall and forces him to stop for a few minutes. He tries to exercise on a treadmill and usually starts at the speed of 5 miles per hour. The calf pain usually makes him stop after a few minutes. He has no rest pain or lower extremity ulceration. He had noninvasive vascular evaluation which showed an ABI of 0.6 on the left and 0.79 on the right. There was borderline significant stenosis involving the left external iliac artery. The left SFA was occluded with reconstitution via collaterals. The right SFA had significant disease but was not excluded.  Past Medical History  Diagnosis Date  . Diabetes mellitus without complication   . Hypertension   . Sinus infection     frequent  . Recurrent boils     abceses  . Obesity   . Hyperlipidemia   . CAD (coronary artery disease)   . Neuropathy   . CHF (congestive heart failure)   . OSA (obstructive sleep apnea)   . PTSD (post-traumatic stress disorder)   . Agent orange exposure   . OSA (obstructive sleep apnea) 03/16/2015  . Chronic post-traumatic stress disorder (PTSD) 03/16/2015  . Claustrophobia 03/16/2015    Past Surgical History  Procedure Laterality Date  . Cyst excision Left 2008    inguinal area  . Hernia repair    . Cardiac catheterization       Current Outpatient Prescriptions  Medication Sig  Dispense Refill  . aspirin 81 MG tablet Take 81 mg by mouth daily.    . Azelastine HCl 0.15 % SOLN 2 sprays in each nostril BID for allergies 30 mL 3  . carbamide peroxide (DEBROX) 6.5 % otic solution Place 5 drops into the left ear 2 (two) times daily. 15 mL 0  . cetirizine (ZYRTEC) 10 MG tablet Take 10 mg by mouth daily.    . chlorthalidone (HYGROTON) 50 MG tablet Take 25 mg by mouth daily.    . cholecalciferol (VITAMIN D) 1000 UNITS tablet Take 1,000 Units by mouth daily.    . citalopram (CELEXA) 20 MG tablet Take 20 mg by mouth daily.    . fluticasone (FLONASE) 50 MCG/ACT nasal spray Place 2 sprays into the nose daily.    . insulin glargine (LANTUS) 100 UNIT/ML injection Inject 45 Units into the skin at bedtime.    Marland Kitchen JARDIANCE 25 MG TABS tablet Take 1 tablet by mouth daily.    Marland Kitchen l-methylfolate-B6-B12 (METANX) 3-35-2 MG TABS Take 1 tablet by mouth daily.    Marland Kitchen loratadine (CLARITIN) 10 MG tablet Take 10 mg by mouth daily.    Marland Kitchen losartan (COZAAR) 50 MG tablet Take 1 tablet (50 mg total) by mouth daily. 30 tablet 11  . meloxicam (MOBIC) 7.5 MG tablet Take 1 tablet (7.5 mg total) by mouth daily. 15 tablet 0  . metoprolol tartrate (LOPRESSOR) 25 MG tablet  Take 25 mg by mouth 2 (two) times daily.    Marland Kitchen NIFEdipine (PROCARDIA XL/ADALAT-CC) 60 MG 24 hr tablet Take 30 mg by mouth daily.     . pantoprazole (PROTONIX) 20 MG tablet Take 20 mg by mouth 3 (three) times daily.    . rosuvastatin (CRESTOR) 20 MG tablet Take 20 mg by mouth daily.     No current facility-administered medications for this visit.    Allergies:   Review of patient's allergies indicates no known allergies.    Social History:  The patient  reports that he quit smoking about 26 years ago. He does not have any smokeless tobacco history on file. He reports that he does not drink alcohol or use illicit drugs.   Family History:  The patient's family history includes Aneurysm in his father; Pneumonia in his mother; Urinary tract  infection in his mother.    ROS:  Please see the history of present illness.   Otherwise, review of systems are positive for none.   All other systems are reviewed and negative.    PHYSICAL EXAM: VS:  BP 124/70 mmHg  Pulse 64  Ht 5\' 8"  (1.727 m)  Wt 284 lb 1.9 oz (128.876 kg)  BMI 43.21 kg/m2  SpO2 93% , BMI Body mass index is 43.21 kg/(m^2). GEN: Well nourished, well developed, in no acute distress HEENT: normal Neck: no JVD, carotid bruits, or masses Cardiac: RRR; 2/6 systolic murmur at the right upper sternal border. An S4 gallop is present.There are no rubs and no edema . Respiratory:  clear to auscultation bilaterally, normal work of breathing GI: soft, nontender, nondistended, + BS MS: no deformity or atrophy Skin: warm and dry, no rash Neuro:  Strength and sensation are intact Psych: euthymic mood, full affect Vascular: Femoral pulses are normal. Distal pulses are not palpable.     Recent Labs: No results found for requested labs within last 365 days.    Lipid Panel No results found for: CHOL, TRIG, HDL, CHOLHDL, VLDL, LDLCALC, LDLDIRECT    Wt Readings from Last 3 Encounters:  05/03/15 284 lb 1.9 oz (128.876 kg)  03/18/15 282 lb (127.914 kg)  03/16/15 281 lb (127.461 kg)         ASSESSMENT AND PLAN:  1.Peripheral arterial disease: The patient has bilateral calf claudication which is moderate. The disease seems to be worse on the left side based on ABI and duplex. However, surprisingly the patient reports more claudication on the right side. This might indicate the presence of another etiology. His claudication does not seem to be lifestyle limiting at the present time and thus I think we can start with medical therapy in an exercise program. I prescribed cilostazol. I also instructed him to exercise a minimum of 5 times per week for 30 minutes. I will reevaluate symptoms in 6 months.  2. Coronary Artery Disease, without angina   3. Left carotid bruit:  Carotid Doppler is pending.    Signed,   Kathlyn Sacramento, MD  05/03/2015 9:12 AM    [place]

## 2015-05-03 NOTE — Patient Instructions (Addendum)
Medication Instructions:  Your physician has recommended you make the following change in your medication:  1. START Pletal (Cilostazol) 100mg  take one half tablet by mouth twice a day for 2 WEEKS and then increase to one tablet by mouth twice a day  Labwork: No new orders.   Testing/Procedures: No new orders.   Follow-Up: Your physician wants you to follow-up in: 6 MONTHS with Dr Fletcher Anon.  You will receive a reminder letter in the mail two months in advance. If you don't receive a letter, please call our office to schedule the follow-up appointment.  Any Other Special Instructions Will Be Listed Below (If Applicable).  Cilostazol tablets What is this medicine? CILOSTAZOL (sil [place] sta zol) is used to treat the symptoms of intermittent claudication. This condition causes pain in the legs during walking, and goes away with rest. By improving blood flow, this medicine helps people with this condition walk longer distances without pain.  How should I use this medicine? Take this medicine by mouth with a full glass of water. Follow the directions on the prescription label. Take this medicine on an empty stomach, at least 30 minutes before or 2 hours after food. Do not take with food. Take your doses at regular intervals. Do not take your medicine more often than directed. Talk to your pediatrician regarding the use of this medicine in children. Special care may be needed.  Do not take this medicine with grapefruit juice  What should I watch for while using this medicine? Visit your doctor or health care professional for regular checks on your progress. It may take 2 to 4 weeks for your condition to start to get better once you begin taking this medicine. In some people, it can take as long as 3 months for the condition to get better. You may get drowsy or dizzy. Do not drive, use machinery, or do anything that needs mental alertness until you know how this drug affects you. Do not stand or sit up  quickly, especially if you are an older patient. This reduces the risk of dizzy or fainting spells. Alcohol can make you more drowsy and dizzy. Avoid alcoholic drinks. Smoking may have effects on the circulation that may limit the benefits you receive from this medicine.  If you are going to have surgery, tell your doctor or health care professional that you are taking this medicine. What side effects may I notice from receiving this medicine? Side effects that you should report to your doctor or health care professional as soon as possible: -allergic reactions like skin rash, itching or hives, swelling of the face, lips, or tongue -chest pain -fast, slow, or irregular heartbeat -signs and symptoms of bleeding such as bloody or black, tarry stools; red or dark-brown urine; spitting up blood or brown material that looks like coffee grounds; red spots on the skin; unusual bruising or bleeding from the eye, gums, or nose -swelling in the legs or ankles Side effects that usually do not require medical attention (report to your doctor or health care professional if they continue or are bothersome): -diarrhea -headache -nausea, or upset stomach This list may not describe all possible side effects. Call your doctor for medical advice about side effects.

## 2015-05-08 NOTE — Progress Notes (Signed)
Cardiology Office Note   Date:  05/10/2015   ID:  [name], DOB 1946-10-28, MRN [id number]  PCP:  Jani Gravel, MD  Cardiologist:  Sinclair Grooms, MD   Chief Complaint  Patient presents with  . Claudication      History of Present Illness: Vickie Ponds. is a 69 y.o. male who presents for CAD, PAD with claudication, obstructive sleep apnea, hypertension, and diabetes mellitus. She also has carotid disease that is asymptomatic.  He is being limited by claudication. He denies angina. No headache or side effects after increasing the dose of losartan. He is beginning an exercise program to improve claudication. With the recent adjustment in losartan he feels his blood pressures have been better.  Recent echocardiogram revealed LVH with reserved systolic function. The carotid Doppler revealed less than 50% obstruction bilaterally.    Past Medical History  Diagnosis Date  . Diabetes mellitus without complication   . Hypertension   . Sinus infection     frequent  . Recurrent boils     abceses  . Obesity   . Hyperlipidemia   . CAD (coronary artery disease)   . Neuropathy   . CHF (congestive heart failure)   . OSA (obstructive sleep apnea)   . PTSD (post-traumatic stress disorder)   . Agent orange exposure   . OSA (obstructive sleep apnea) 03/16/2015  . Chronic post-traumatic stress disorder (PTSD) 03/16/2015  . Claustrophobia 03/16/2015    Past Surgical History  Procedure Laterality Date  . Cyst excision Left 2008    inguinal area  . Hernia repair    . Cardiac catheterization       Current Outpatient Prescriptions  Medication Sig Dispense Refill  . aspirin 81 MG tablet Take 81 mg by mouth daily.    . Azelastine HCl 0.15 % SOLN 2 sprays in each nostril BID for allergies 30 mL 3  . carbamide peroxide (DEBROX) 6.5 % otic solution Place 5 drops into the left ear 2 (two) times daily. 15 mL 0  . chlorthalidone (HYGROTON) 50 MG tablet Take 25 mg by mouth daily.      . cholecalciferol (VITAMIN D) 1000 UNITS tablet Take 1,000 Units by mouth daily.    . cilostazol (PLETAL) 100 MG tablet Take 1 tablet (100 mg total) by mouth 2 (two) times daily. 60 tablet 11  . citalopram (CELEXA) 20 MG tablet Take 20 mg by mouth daily.    . insulin glargine (LANTUS) 100 UNIT/ML injection Inject 45 Units into the skin at bedtime.    . insulin regular (NOVOLIN R,HUMULIN R) 100 units/mL injection Inject 3 Units into the skin 2 (two) times daily before a meal.    . JARDIANCE 25 MG TABS tablet Take 1 tablet by mouth daily.    Marland Kitchen l-methylfolate-B6-B12 (METANX) 3-35-2 MG TABS Take 1 tablet by mouth daily.    Marland Kitchen loratadine (CLARITIN) 10 MG tablet Take 10 mg by mouth daily.    Marland Kitchen losartan (COZAAR) 50 MG tablet Take 1 tablet (50 mg total) by mouth daily. 30 tablet 11  . meloxicam (MOBIC) 7.5 MG tablet Take 1 tablet (7.5 mg total) by mouth daily. 15 tablet 0  . metoprolol tartrate (LOPRESSOR) 25 MG tablet Take 25 mg by mouth 2 (two) times daily.    Marland Kitchen NIFEdipine (PROCARDIA XL/ADALAT-CC) 60 MG 24 hr tablet Take 30 mg by mouth daily.     . pantoprazole (PROTONIX) 20 MG tablet Take 20 mg by mouth 3 (three) times daily.    Marland Kitchen  rosuvastatin (CRESTOR) 20 MG tablet Take 20 mg by mouth daily.     No current facility-administered medications for this visit.    Allergies:   Review of patient's allergies indicates no known allergies.    Social History:  The patient  reports that he quit smoking about 26 years ago. He has never used smokeless tobacco. He reports that he does not drink alcohol or use illicit drugs.   Family History:  The patient's family history includes Aneurysm in his father; Dementia in his sister; Healthy in his sister; Pneumonia in his mother; Sarcoidosis in his sister; Stroke in his sister; Urinary tract infection in his mother.    ROS:  Please see the history of present illness.   Otherwise, review of systems are positive for claudication as mentioned above. No medication  side effects..   All other systems are reviewed and negative.    PHYSICAL EXAM: VS:  BP 130/60 mmHg  Pulse 87  Ht 5\' 8"  (1.727 m)  Wt 127.914 kg (282 lb)  BMI 42.89 kg/m2  SpO2 92% , BMI Body mass index is 42.89 kg/(m^2). GEN: Well nourished, well developed, in no acute distress HEENT: normal Neck: no JVD, carotid bruits, or masses Cardiac: RRR; no murmurs, rubs, or gallops,no edema  Respiratory:  clear to auscultation bilaterally, normal work of breathing GI: soft, nontender, nondistended, + BS MS: no deformity or atrophy Skin: warm and dry, no rash Neuro:  Strength and sensation are intact Psych: euthymic mood, full affect   EKG:  EKG is not ordered today.    Recent Labs: No results found for requested labs within last 365 days.    Lipid Panel No results found for: CHOL, TRIG, HDL, CHOLHDL, VLDL, LDLCALC, LDLDIRECT    Wt Readings from Last 3 Encounters:  05/10/15 127.914 kg (282 lb)  05/03/15 128.876 kg (284 lb 1.9 oz)  03/18/15 127.914 kg (282 lb)      Other studies Reviewed: Additional studies/ records that were reviewed today include: .    ASSESSMENT AND PLAN: 1. Claudication Recently evaluated and an exercise program was prescribed by Dr. Fletcher Anon  2. Coronary artery disease involving native coronary artery of native heart without angina pectoris Asymptomatic  3. Left carotid bruit Recent vascular carotid study demonstrated less than 50% obstruction bilaterally.  4. Hypertension with left ventricular hypertrophy by recent echo Preserved left ventricular systolic function. Losartan dose was increased after echo results were found. Needs basic metabolic panel today.  5. Systolic murmur Recent echo did not demonstrate any significant valvular abnormality. He did have significant left ventricular hypertrophy.    Current medicines are reviewed at length with the patient today.  The patient does not have concerns regarding medicines.  The following  changes have been made:  Check basic metabolic panel  Labs/ tests ordered today include:   Orders Placed This Encounter  Procedures  . Basic metabolic panel     Disposition:   FU with HS in 6 months  Signed, Sinclair Grooms, MD  05/10/2015 9:28 AM    [address], [place], [place]  [address] Phone: [phone]; Fax: [phone]

## 2015-05-10 ENCOUNTER — Ambulatory Visit (INDEPENDENT_AMBULATORY_CARE_PROVIDER_SITE_OTHER): Payer: Medicare Other | Admitting: Interventional Cardiology

## 2015-05-10 ENCOUNTER — Encounter: Payer: Self-pay | Admitting: Interventional Cardiology

## 2015-05-10 VITALS — BP 130/60 | HR 87 | Ht 68.0 in | Wt 282.0 lb

## 2015-05-10 DIAGNOSIS — G4733 Obstructive sleep apnea (adult) (pediatric): Secondary | ICD-10-CM

## 2015-05-10 DIAGNOSIS — N183 Chronic kidney disease, stage 3 unspecified: Secondary | ICD-10-CM | POA: Insufficient documentation

## 2015-05-10 DIAGNOSIS — I119 Hypertensive heart disease without heart failure: Secondary | ICD-10-CM | POA: Diagnosis not present

## 2015-05-10 DIAGNOSIS — E1022 Type 1 diabetes mellitus with diabetic chronic kidney disease: Secondary | ICD-10-CM | POA: Insufficient documentation

## 2015-05-10 DIAGNOSIS — I2510 Atherosclerotic heart disease of native coronary artery without angina pectoris: Secondary | ICD-10-CM

## 2015-05-10 DIAGNOSIS — I739 Peripheral vascular disease, unspecified: Secondary | ICD-10-CM

## 2015-05-10 DIAGNOSIS — R0989 Other specified symptoms and signs involving the circulatory and respiratory systems: Secondary | ICD-10-CM | POA: Diagnosis not present

## 2015-05-10 DIAGNOSIS — R011 Cardiac murmur, unspecified: Secondary | ICD-10-CM

## 2015-05-10 LAB — BASIC METABOLIC PANEL
BUN: 24 mg/dL — ABNORMAL HIGH (ref 6–23)
CO2: 25 mEq/L (ref 19–32)
Calcium: 9.6 mg/dL (ref 8.4–10.5)
Chloride: 103 mEq/L (ref 96–112)
Creatinine, Ser: 1.69 mg/dL — ABNORMAL HIGH (ref 0.40–1.50)
GFR: 51.68 mL/min — ABNORMAL LOW (ref >60.00)
Glucose, Bld: 198 mg/dL — ABNORMAL HIGH (ref 70–99)
Potassium: 3.9 mEq/L (ref 3.5–5.1)
Sodium: 136 mEq/L (ref 135–145)

## 2015-05-10 NOTE — Patient Instructions (Signed)
Medication Instructions:  Your physician recommends that you continue on your current medications as directed. Please refer to the Current Medication list given to you today.   Labwork: Bmet today  Testing/Procedures: None   Follow-Up: Your physician wants you to follow-up in: 6 months with Dr.Smith You will receive a reminder letter in the mail two months in advance. If you don't receive a letter, please call our office to schedule the follow-up appointment.   Any Other Special Instructions Will Be Listed Below (If Applicable).

## 2015-05-11 ENCOUNTER — Telehealth: Payer: Self-pay

## 2015-05-11 NOTE — Telephone Encounter (Signed)
-----   Message from Belva Crome, MD sent at 05/10/2015  6:21 PM EDT ----- Lab shoes mild kidney impairment.

## 2015-05-11 NOTE — Telephone Encounter (Signed)
Pt wife aware of lab results. Lab shows mild kidney impairment. Pt wife verbalized understanding.

## 2015-07-07 DIAGNOSIS — E119 Type 2 diabetes mellitus without complications: Secondary | ICD-10-CM | POA: Diagnosis not present

## 2015-07-07 DIAGNOSIS — E785 Hyperlipidemia, unspecified: Secondary | ICD-10-CM | POA: Diagnosis not present

## 2015-07-07 DIAGNOSIS — Z23 Encounter for immunization: Secondary | ICD-10-CM | POA: Diagnosis not present

## 2015-07-07 DIAGNOSIS — I10 Essential (primary) hypertension: Secondary | ICD-10-CM | POA: Diagnosis not present

## 2015-07-07 DIAGNOSIS — I2510 Atherosclerotic heart disease of native coronary artery without angina pectoris: Secondary | ICD-10-CM | POA: Diagnosis not present

## 2015-10-12 DIAGNOSIS — E119 Type 2 diabetes mellitus without complications: Secondary | ICD-10-CM | POA: Diagnosis not present

## 2015-10-12 DIAGNOSIS — I10 Essential (primary) hypertension: Secondary | ICD-10-CM | POA: Diagnosis not present

## 2015-10-13 DIAGNOSIS — E119 Type 2 diabetes mellitus without complications: Secondary | ICD-10-CM | POA: Diagnosis not present

## 2015-10-13 DIAGNOSIS — I10 Essential (primary) hypertension: Secondary | ICD-10-CM | POA: Diagnosis not present

## 2015-10-20 ENCOUNTER — Encounter: Payer: Self-pay | Admitting: Interventional Cardiology

## 2015-10-20 DIAGNOSIS — I10 Essential (primary) hypertension: Secondary | ICD-10-CM | POA: Diagnosis not present

## 2015-10-20 DIAGNOSIS — E119 Type 2 diabetes mellitus without complications: Secondary | ICD-10-CM | POA: Diagnosis not present

## 2015-10-20 DIAGNOSIS — K635 Polyp of colon: Secondary | ICD-10-CM | POA: Diagnosis not present

## 2015-10-20 DIAGNOSIS — E785 Hyperlipidemia, unspecified: Secondary | ICD-10-CM | POA: Diagnosis not present

## 2015-11-01 ENCOUNTER — Encounter: Payer: Self-pay | Admitting: Cardiovascular Disease

## 2015-11-01 ENCOUNTER — Ambulatory Visit (INDEPENDENT_AMBULATORY_CARE_PROVIDER_SITE_OTHER): Payer: Medicare Other | Admitting: Cardiovascular Disease

## 2015-11-01 VITALS — BP 150/70 | HR 89 | Ht 68.0 in | Wt 280.4 lb

## 2015-11-01 DIAGNOSIS — I739 Peripheral vascular disease, unspecified: Secondary | ICD-10-CM | POA: Diagnosis not present

## 2015-11-01 DIAGNOSIS — I2510 Atherosclerotic heart disease of native coronary artery without angina pectoris: Secondary | ICD-10-CM | POA: Diagnosis not present

## 2015-11-01 NOTE — Progress Notes (Signed)
Cardiology Office Note   Date:  11/01/2015   ID:  Joel Henri., DOB June 29, 1946, MRN [id number]  PCP:  Dr. Maudie Mercury Cardiologist:   Dr. Tamala Julian      History of Present Illness: Joel Rivera. is a 69 y.o. male who is here today for a follow-up visit regarding  peripheral arterial disease. He has known history of type 2 diabetes, hypertension, chronic kidney disease, obesity, sleep apnea, hyperlipidemia, nonobstructive coronary disease, diastolic heart failure, and agent orange exposure. The patient was seen recently for  bilateral calf claudication  worse on the right side.  Noninvasive evaluation showed an ABI of 0.6 on the left and 0.79 on the right. There was borderline significant stenosis involving the left external iliac artery. The left SFA was occluded with reconstitution via collaterals. The right SFA had significant disease. I started him on cilostazol and advised him to start a walking program. He reports significant improvement in claudication which is currently only mild and not lifestyle limiting. He started walking on the treadmill and has been able to do so for 30-40 minutes at a time. He denies any chest pain or shortness of breath. Carotid Doppler showed less than 50% stenosis bilaterally.    Past Medical History  Diagnosis Date  . Diabetes mellitus without complication ([place])   . Hypertension   . Sinus infection     frequent  . Recurrent boils     abceses  . Obesity   . Hyperlipidemia   . CAD (coronary artery disease)   . Neuropathy ([place])   . CHF (congestive heart failure) ([place])   . OSA (obstructive sleep apnea)   . PTSD (post-traumatic stress disorder)   . Agent orange exposure   . OSA (obstructive sleep apnea) 03/16/2015  . Chronic post-traumatic stress disorder (PTSD) 03/16/2015  . Claustrophobia 03/16/2015    Past Surgical History  Procedure Laterality Date  . Cyst excision Left 2008    inguinal area  . Hernia repair    . Cardiac catheterization        Current Outpatient Prescriptions  Medication Sig Dispense Refill  . aspirin 81 MG tablet Take 81 mg by mouth daily.    Marland Kitchen azelastine (ASTELIN) 0.1 % nasal spray Place 2 sprays into both nostrils 2 (two) times daily. Use in each nostril as directed    . carbamide peroxide (DEBROX) 6.5 % otic solution Place 5 drops into the left ear 2 (two) times daily. 15 mL 0  . chlorthalidone (HYGROTON) 50 MG tablet Take 25 mg by mouth daily.    . cholecalciferol (VITAMIN D) 1000 UNITS tablet Take 1,000 Units by mouth daily.    . cilostazol (PLETAL) 100 MG tablet Take 1 tablet (100 mg total) by mouth 2 (two) times daily. 60 tablet 11  . citalopram (CELEXA) 20 MG tablet Take 20 mg by mouth daily.    . insulin glargine (LANTUS) 100 UNIT/ML injection Inject 45 Units into the skin at bedtime.    . insulin regular (NOVOLIN R,HUMULIN R) 100 units/mL injection Inject 3 Units into the skin 2 (two) times daily before a meal.    . JARDIANCE 25 MG TABS tablet Take 1 tablet by mouth daily.    Marland Kitchen l-methylfolate-B6-B12 (METANX) 3-35-2 MG TABS Take 1 tablet by mouth daily.    Marland Kitchen loratadine (CLARITIN) 10 MG tablet Take 10 mg by mouth daily.    Marland Kitchen losartan (COZAAR) 50 MG tablet Take 1 tablet (50 mg total) by mouth daily. 30 tablet 11  .  meloxicam (MOBIC) 7.5 MG tablet Take 1 tablet (7.5 mg total) by mouth daily. 15 tablet 0  . metoprolol tartrate (LOPRESSOR) 25 MG tablet Take 25 mg by mouth 2 (two) times daily.    Marland Kitchen NIFEdipine (PROCARDIA XL/ADALAT-CC) 60 MG 24 hr tablet Take 30 mg by mouth daily.     . pantoprazole (PROTONIX) 20 MG tablet Take 20 mg by mouth 3 (three) times daily.    . rosuvastatin (CRESTOR) 20 MG tablet Take 20 mg by mouth daily.    Marland Kitchen HUMALOG KWIKPEN 100 UNIT/ML KiwkPen Inject 5 Units as directed daily.     No current facility-administered medications for this visit.    Allergies:   Review of patient's allergies indicates no known allergies.    Social History:  The patient  reports that he quit  smoking about 27 years ago. He has never used smokeless tobacco. He reports that he does not drink alcohol or use illicit drugs.   Family History:  The patient's family history includes Aneurysm in his father; Dementia in his sister; Healthy in his sister; Pneumonia in his mother; Sarcoidosis in his sister; Stroke in his sister; Urinary tract infection in his mother.    ROS:  Please see the history of present illness.   Otherwise, review of systems are positive for none.   All other systems are reviewed and negative.    PHYSICAL EXAM: VS:  BP 150/70 mmHg  Pulse 89  Ht 5\' 8"  (1.727 m)  Wt 280 lb 6.4 oz (127.189 kg)  BMI 42.64 kg/m2 , BMI Body mass index is 42.64 kg/(m^2). GEN: Well nourished, well developed, in no acute distress HEENT: normal Neck: no JVD, carotid bruits, or masses Cardiac: RRR; 2/6 systolic murmur at the right upper sternal border. An S4 gallop is present.There are no rubs and no edema . Respiratory:  clear to auscultation bilaterally, normal work of breathing GI: soft, nontender, nondistended, + BS MS: no deformity or atrophy Skin: warm and dry, no rash Neuro:  Strength and sensation are intact Psych: euthymic mood, full affect Vascular: Femoral pulses are normal. Distal pulses are not palpable.     Recent Labs: 05/10/2015: BUN 24*; Creatinine, Ser 1.69*; Potassium 3.9; Sodium 136    Lipid Panel No results found for: CHOL, TRIG, HDL, CHOLHDL, VLDL, LDLCALC, LDLDIRECT    Wt Readings from Last 3 Encounters:  11/01/15 280 lb 6.4 oz (127.189 kg)  05/10/15 282 lb (127.914 kg)  05/03/15 284 lb 1.9 oz (128.876 kg)         ASSESSMENT AND PLAN:  1.Peripheral arterial disease: The patient has bilateral calf claudication  due to mainly to SFA disease.  His symptoms improved significantly with cilostazol and a walking program. His symptoms are clearly not lifestyle limiting and he is happy with his progress. Thus, I recommend continuing medical therapy. 2.  Coronary Artery Disease, without angina   3. Left carotid bruit: Mild disease by Doppler.  4. Essential hypertension: Blood pressure is mildly elevated. Continue to monitor and consider switching metoprolol to carvedilol if needed.  5. Hyperlipidemia: Continue treatment with rosuvastatin with a target LDL of less than 70.  Signed,   Kathlyn [place], MD  11/01/2015 9:16 AM    [place]

## 2015-11-01 NOTE — Patient Instructions (Signed)
Medication Instructions: Continue same medications.   Labwork: None.   Procedures/Testing: None.   Follow-Up: 1 year follow up with Dr. Guenevere Roorda.   Any Additional Special Instructions Will Be Listed Below (If Applicable).   

## 2015-12-26 ENCOUNTER — Encounter: Payer: Self-pay | Admitting: Interventional Cardiology

## 2015-12-30 DIAGNOSIS — I10 Essential (primary) hypertension: Secondary | ICD-10-CM | POA: Diagnosis not present

## 2015-12-30 DIAGNOSIS — N390 Urinary tract infection, site not specified: Secondary | ICD-10-CM | POA: Diagnosis not present

## 2015-12-30 DIAGNOSIS — E119 Type 2 diabetes mellitus without complications: Secondary | ICD-10-CM | POA: Diagnosis not present

## 2015-12-30 DIAGNOSIS — R300 Dysuria: Secondary | ICD-10-CM | POA: Diagnosis not present

## 2016-01-02 ENCOUNTER — Telehealth: Payer: Self-pay | Admitting: Interventional Cardiology

## 2016-01-02 NOTE — Telephone Encounter (Signed)
Pt is seeing Dr Tamala Julian tomorrow morning. His primary,Dr Maudie Mercury wants him to have a urine culture while he is here. He have been having serious problems with his urine since Thursday. He saw Dr Maudie Mercury Friday and they called him today to tell him that. Any further questions wife said to call her.

## 2016-01-02 NOTE — Telephone Encounter (Signed)
Attempted to return pt call x2 .unable to lmom pt line busy Pt pcp needs to give pt a written order for urine culture

## 2016-01-03 ENCOUNTER — Ambulatory Visit (INDEPENDENT_AMBULATORY_CARE_PROVIDER_SITE_OTHER): Payer: Medicare Other | Admitting: Interventional Cardiology

## 2016-01-03 ENCOUNTER — Encounter: Payer: Self-pay | Admitting: Interventional Cardiology

## 2016-01-03 VITALS — BP 144/60 | HR 89 | Ht 68.0 in | Wt 267.8 lb

## 2016-01-03 DIAGNOSIS — I6529 Occlusion and stenosis of unspecified carotid artery: Secondary | ICD-10-CM

## 2016-01-03 DIAGNOSIS — I739 Peripheral vascular disease, unspecified: Secondary | ICD-10-CM

## 2016-01-03 DIAGNOSIS — I2510 Atherosclerotic heart disease of native coronary artery without angina pectoris: Secondary | ICD-10-CM | POA: Diagnosis not present

## 2016-01-03 DIAGNOSIS — I119 Hypertensive heart disease without heart failure: Secondary | ICD-10-CM | POA: Diagnosis not present

## 2016-01-03 DIAGNOSIS — G4733 Obstructive sleep apnea (adult) (pediatric): Secondary | ICD-10-CM | POA: Diagnosis not present

## 2016-01-03 DIAGNOSIS — I658 Occlusion and stenosis of other precerebral arteries: Secondary | ICD-10-CM

## 2016-01-03 DIAGNOSIS — R300 Dysuria: Secondary | ICD-10-CM | POA: Diagnosis not present

## 2016-01-03 DIAGNOSIS — N390 Urinary tract infection, site not specified: Secondary | ICD-10-CM | POA: Diagnosis not present

## 2016-01-03 DIAGNOSIS — I6523 Occlusion and stenosis of bilateral carotid arteries: Secondary | ICD-10-CM

## 2016-01-03 NOTE — Progress Notes (Signed)
Cardiology Office Note   Date:  01/03/2016   ID:  Joel Rivera., DOB 1946-11-10, MRN [id number]  PCP:  Joel Gravel, MD  Cardiologist:  Sinclair Grooms, MD   Chief Complaint  Patient presents with  . Coronary Artery Disease      History of Present Illness: Joel Rivera. is a 70 y.o. male who presents for  Follow-up of CAD, essential hypertension, obesity, hyperlipidemia, and claudication. He also has obstructive sleep apnea.      Past Medical History  Diagnosis Date  . Diabetes mellitus without complication ([place])   . Hypertension   . Sinus infection     frequent  . Recurrent boils     abceses  . Obesity   . Hyperlipidemia   . CAD (coronary artery disease)   . Neuropathy ([place])   . CHF (congestive heart failure) ([place])   . OSA (obstructive sleep apnea)   . PTSD (post-traumatic stress disorder)   . Agent orange exposure   . OSA (obstructive sleep apnea) 03/16/2015  . Chronic post-traumatic stress disorder (PTSD) 03/16/2015  . Claustrophobia 03/16/2015    Past Surgical History  Procedure Laterality Date  . Cyst excision Left 2008    inguinal area  . Hernia repair    . Cardiac catheterization       Current Outpatient Prescriptions  Medication Sig Dispense Refill  . aspirin 81 MG tablet Take 81 mg by mouth daily.    Marland Kitchen azelastine (ASTELIN) 0.1 % nasal spray Place 2 sprays into both nostrils 2 (two) times daily. Use in each nostril as directed    . chlorthalidone (HYGROTON) 50 MG tablet Take 25 mg by mouth daily.    . cholecalciferol (VITAMIN D) 1000 UNITS tablet Take 1,000 Units by mouth daily.    . cilostazol (PLETAL) 100 MG tablet Take 1 tablet (100 mg total) by mouth 2 (two) times daily. 60 tablet 11  . citalopram (CELEXA) 20 MG tablet Take 20 mg by mouth daily.    Marland Kitchen HUMALOG KWIKPEN 100 UNIT/ML KiwkPen Inject five (5) units into the skin every morning. Inject seven (7) units into the skin around mid-day/ lunch.    . insulin glargine (LANTUS) 100 UNIT/ML  injection Inject 45 Units into the skin at bedtime.    Marland Kitchen JARDIANCE 25 MG TABS tablet Take 1 tablet by mouth daily.    Marland Kitchen l-methylfolate-B6-B12 (METANX) 3-35-2 MG TABS Take 1 tablet by mouth 2 (two) times daily.     Marland Kitchen loratadine (CLARITIN) 10 MG tablet Take 10 mg by mouth daily.    Marland Kitchen losartan (COZAAR) 50 MG tablet Take 1 tablet (50 mg total) by mouth daily. 30 tablet 11  . NIFEdipine (PROCARDIA XL/ADALAT-CC) 60 MG 24 hr tablet Take 30 mg by mouth daily.     . pantoprazole (PROTONIX) 20 MG tablet Take 20 mg by mouth 3 (three) times daily.    . rosuvastatin (CRESTOR) 20 MG tablet Take 20 mg by mouth daily.     No current facility-administered medications for this visit.    Allergies:   Review of patient's allergies indicates no known allergies.    Social History:  The patient  reports that he quit smoking about 27 years ago. He has never used smokeless tobacco. He reports that he does not drink alcohol or use illicit drugs.   Family History:  The patient's family history includes Aneurysm in his father; Dementia in his sister; Healthy in his sister; Pneumonia in his mother; Sarcoidosis in his sister;  Stroke in his sister; Urinary tract infection in his mother.    ROS:  Please see the history of present illness.   Otherwise, review of systems are positive for  Polyuria, diarrhea, dizziness, and difficulty with sinus congestion..   All other systems are reviewed and negative.    PHYSICAL EXAM: VS:  BP 144/60 mmHg  Pulse 89  Ht 5\' 8"  (1.727 m)  Wt 267 lb 12.8 oz (121.473 kg)  BMI 40.73 kg/m2 , BMI Body mass index is 40.73 kg/(m^2). GEN: Well nourished, well developed, in no acute distress HEENT: normal Neck: no JVD, carotid bruits, or masses Cardiac: RRR.  There is no murmur, rub, or gallop. There is no edema. Respiratory:  clear to auscultation bilaterally, normal work of breathing. GI: soft, nontender, nondistended, + BS MS: no deformity or atrophy Skin: warm and dry, no rash Neuro:   Strength and sensation are intact Psych: euthymic mood, full affect   EKG:  EKG is ordered today. The ekg reveals  I'll sinus rhythm, left atrial abnormality, left ventricular hypertrophy, left axis deviation.   Recent Labs: 05/10/2015: BUN 24*; Creatinine, Ser 1.69*; Potassium 3.9; Sodium 136    Lipid Panel No results found for: CHOL, TRIG, HDL, CHOLHDL, VLDL, LDLCALC, LDLDIRECT    Wt Readings from Last 3 Encounters:  01/03/16 267 lb 12.8 oz (121.473 kg)  11/01/15 280 lb 6.4 oz (127.189 kg)  05/10/15 282 lb (127.914 kg)      Other studies Reviewed: Additional studies/ records that were reviewed today include: None. The findings include none.    ASSESSMENT AND PLAN:  1. Coronary artery disease involving native coronary artery of native heart without angina pectoris  asymptomatic - EKG 12-Lead  2. Claudication ([place])  improved with regular walking  3. Hypertensive heart disease without heart failure  good blood pressure control - EKG 12-Lead  4. OSA (obstructive sleep apnea)  compliant with C Pap  5. Carotid stenosis, bilateral  asymptomatic  Current medicines are reviewed at length with the patient today.  The patient has the following concerns regarding medicines:  none.  The following changes/actions have been instituted:     Increase aerobic activity with walking as much as possible  Labs/ tests ordered today include:  No orders of the defined types were placed in this encounter.     Disposition:   FU with HS in 1 year  Signed, Sinclair Grooms, MD  01/03/2016 10:29 AM    [address], [place], [place]  [address] Phone: [phone]; Fax: [phone]

## 2016-01-03 NOTE — Patient Instructions (Signed)
Medication Instructions:  Your physician recommends that you continue on your current medications as directed. Please refer to the Current Medication list given to you today.   Labwork: None ordered  Testing/Procedures: None ordered  Follow-Up: Your physician wants you to follow-up in: 1 year You will receive a reminder letter in the mail two months in advance. If you don't receive a letter, please call our office to schedule the follow-up appointment.   Any Other Special Instructions Will Be Listed Below (If Applicable).     If you need a refill on your cardiac medications before your next appointment, please call your pharmacy.   

## 2016-01-06 ENCOUNTER — Ambulatory Visit: Payer: Medicare Other | Admitting: Interventional Cardiology

## 2016-01-18 DIAGNOSIS — E119 Type 2 diabetes mellitus without complications: Secondary | ICD-10-CM | POA: Diagnosis not present

## 2016-01-18 DIAGNOSIS — K219 Gastro-esophageal reflux disease without esophagitis: Secondary | ICD-10-CM | POA: Diagnosis not present

## 2016-01-18 DIAGNOSIS — Z1389 Encounter for screening for other disorder: Secondary | ICD-10-CM | POA: Diagnosis not present

## 2016-01-18 DIAGNOSIS — I10 Essential (primary) hypertension: Secondary | ICD-10-CM | POA: Diagnosis not present

## 2016-01-24 DIAGNOSIS — E119 Type 2 diabetes mellitus without complications: Secondary | ICD-10-CM | POA: Diagnosis not present

## 2016-01-24 DIAGNOSIS — I10 Essential (primary) hypertension: Secondary | ICD-10-CM | POA: Diagnosis not present

## 2016-02-15 DIAGNOSIS — I10 Essential (primary) hypertension: Secondary | ICD-10-CM | POA: Diagnosis not present

## 2016-02-15 DIAGNOSIS — E119 Type 2 diabetes mellitus without complications: Secondary | ICD-10-CM | POA: Diagnosis not present

## 2016-02-15 DIAGNOSIS — Z1389 Encounter for screening for other disorder: Secondary | ICD-10-CM | POA: Diagnosis not present

## 2016-02-15 DIAGNOSIS — E785 Hyperlipidemia, unspecified: Secondary | ICD-10-CM | POA: Diagnosis not present

## 2016-03-12 ENCOUNTER — Encounter (HOSPITAL_COMMUNITY): Payer: Medicare Other

## 2016-03-12 ENCOUNTER — Ambulatory Visit (HOSPITAL_COMMUNITY)
Admission: RE | Admit: 2016-03-12 | Discharge: 2016-03-12 | Disposition: A | Discharge status: Home / Self Care | Payer: Medicare Other | Source: Ambulatory Visit | Attending: Cardiology | Admitting: Cardiology

## 2016-03-12 DIAGNOSIS — I110 Hypertensive heart disease with heart failure: Secondary | ICD-10-CM | POA: Insufficient documentation

## 2016-03-12 DIAGNOSIS — I2510 Atherosclerotic heart disease of native coronary artery without angina pectoris: Secondary | ICD-10-CM | POA: Diagnosis not present

## 2016-03-12 DIAGNOSIS — G4733 Obstructive sleep apnea (adult) (pediatric): Secondary | ICD-10-CM | POA: Insufficient documentation

## 2016-03-12 DIAGNOSIS — E1140 Type 2 diabetes mellitus with diabetic neuropathy, unspecified: Secondary | ICD-10-CM | POA: Diagnosis not present

## 2016-03-12 DIAGNOSIS — I509 Heart failure, unspecified: Secondary | ICD-10-CM | POA: Diagnosis not present

## 2016-03-12 DIAGNOSIS — E785 Hyperlipidemia, unspecified: Secondary | ICD-10-CM | POA: Insufficient documentation

## 2016-03-12 DIAGNOSIS — I6523 Occlusion and stenosis of bilateral carotid arteries: Secondary | ICD-10-CM | POA: Diagnosis not present

## 2016-03-20 ENCOUNTER — Telehealth: Payer: Self-pay | Admitting: Interventional Cardiology

## 2016-03-20 DIAGNOSIS — I6523 Occlusion and stenosis of bilateral carotid arteries: Secondary | ICD-10-CM

## 2016-03-20 NOTE — Telephone Encounter (Signed)
New message:  Pt's wife is calling back to get the results to the pt's Carotid doppler. Please f/u with her

## 2016-03-20 NOTE — Telephone Encounter (Signed)
Pt and pt wife aware of carotis results with verbal understanding.

## 2016-04-24 ENCOUNTER — Other Ambulatory Visit: Payer: Self-pay | Admitting: Interventional Cardiology

## 2016-05-14 DIAGNOSIS — E119 Type 2 diabetes mellitus without complications: Secondary | ICD-10-CM | POA: Diagnosis not present

## 2016-05-14 DIAGNOSIS — E785 Hyperlipidemia, unspecified: Secondary | ICD-10-CM | POA: Diagnosis not present

## 2016-05-14 DIAGNOSIS — I10 Essential (primary) hypertension: Secondary | ICD-10-CM | POA: Diagnosis not present

## 2016-05-21 DIAGNOSIS — E0841 Diabetes mellitus due to underlying condition with diabetic mononeuropathy: Secondary | ICD-10-CM | POA: Diagnosis not present

## 2016-05-21 DIAGNOSIS — E119 Type 2 diabetes mellitus without complications: Secondary | ICD-10-CM | POA: Diagnosis not present

## 2016-05-21 DIAGNOSIS — I2510 Atherosclerotic heart disease of native coronary artery without angina pectoris: Secondary | ICD-10-CM | POA: Diagnosis not present

## 2016-05-21 DIAGNOSIS — Z0000 Encounter for general adult medical examination without abnormal findings: Secondary | ICD-10-CM | POA: Diagnosis not present

## 2016-05-21 DIAGNOSIS — I10 Essential (primary) hypertension: Secondary | ICD-10-CM | POA: Diagnosis not present

## 2016-05-31 DIAGNOSIS — I10 Essential (primary) hypertension: Secondary | ICD-10-CM | POA: Diagnosis not present

## 2016-06-04 DIAGNOSIS — N289 Disorder of kidney and ureter, unspecified: Secondary | ICD-10-CM | POA: Diagnosis not present

## 2016-06-04 DIAGNOSIS — E119 Type 2 diabetes mellitus without complications: Secondary | ICD-10-CM | POA: Diagnosis not present

## 2016-06-04 DIAGNOSIS — I2510 Atherosclerotic heart disease of native coronary artery without angina pectoris: Secondary | ICD-10-CM | POA: Diagnosis not present

## 2016-06-04 DIAGNOSIS — I10 Essential (primary) hypertension: Secondary | ICD-10-CM | POA: Diagnosis not present

## 2016-06-26 ENCOUNTER — Other Ambulatory Visit: Payer: Self-pay | Admitting: Cardiovascular Disease

## 2016-06-26 DIAGNOSIS — I739 Peripheral vascular disease, unspecified: Secondary | ICD-10-CM

## 2016-06-26 NOTE — Telephone Encounter (Signed)
Review for refill, Thank you. 

## 2016-07-18 DIAGNOSIS — Z23 Encounter for immunization: Secondary | ICD-10-CM | POA: Diagnosis not present

## 2016-07-18 DIAGNOSIS — E119 Type 2 diabetes mellitus without complications: Secondary | ICD-10-CM | POA: Diagnosis not present

## 2016-07-18 DIAGNOSIS — I10 Essential (primary) hypertension: Secondary | ICD-10-CM | POA: Diagnosis not present

## 2016-07-27 ENCOUNTER — Encounter: Payer: Self-pay | Admitting: Nurse Practitioner

## 2016-07-27 ENCOUNTER — Encounter (INDEPENDENT_AMBULATORY_CARE_PROVIDER_SITE_OTHER): Payer: Self-pay

## 2016-07-27 ENCOUNTER — Ambulatory Visit (INDEPENDENT_AMBULATORY_CARE_PROVIDER_SITE_OTHER): Payer: Medicare Other | Admitting: Nurse Practitioner

## 2016-07-27 VITALS — BP 130/66 | HR 79 | Ht 68.0 in | Wt 285.0 lb

## 2016-07-27 DIAGNOSIS — I2510 Atherosclerotic heart disease of native coronary artery without angina pectoris: Secondary | ICD-10-CM

## 2016-07-27 DIAGNOSIS — I6523 Occlusion and stenosis of bilateral carotid arteries: Secondary | ICD-10-CM

## 2016-07-27 DIAGNOSIS — G4733 Obstructive sleep apnea (adult) (pediatric): Secondary | ICD-10-CM

## 2016-07-27 DIAGNOSIS — I119 Hypertensive heart disease without heart failure: Secondary | ICD-10-CM | POA: Diagnosis not present

## 2016-07-27 DIAGNOSIS — I5032 Chronic diastolic (congestive) heart failure: Secondary | ICD-10-CM

## 2016-07-27 DIAGNOSIS — I739 Peripheral vascular disease, unspecified: Secondary | ICD-10-CM

## 2016-07-27 NOTE — Patient Instructions (Addendum)
We will be checking the following labs today - NONE   Medication Instructions:    Continue with your current medicines.     Testing/Procedures To Be Arranged:  2 day Lexiscan Myoview  Follow-Up:   See Dr. Tamala Julian as planned    Other Special Instructions:   Think about what we talked about today   Here are my tips to lose weight:  1. Drink only water. You do not need milk, juice, tea, soda or diet soda.  2. Do not eat anything "white". This includes white bread, potatoes, rice or mayo  3. Stay away from fried foods and sweets  4. Your portion should be the size of the palm of your hand.  5. Know what your weaknesses are and avoid.   6. Find an exercise you like and try to work up to doing it every day for 45 to 60 minutes.          If you need a refill on your cardiac medications before your next appointment, please call your pharmacy.   Call the [place] office at [phone] if you have any questions, problems or concerns.

## 2016-07-27 NOTE — Progress Notes (Signed)
CARDIOLOGY OFFICE NOTE  Date:  07/27/2016    [name] Date of Birth: May 05, 1946 Medical Record [id number]  PCP:  Jani Gravel, MD  Cardiologist:  Tamala Julian  Chief Complaint  Patient presents with  . Follow-up    Referred by PCP - seen for Dr. Tamala Julian; Has HO CAD/HTN/HLD/PAD    History of Present Illness: Kristin Rossiter. is a 70 y.o. male who presents today for a work in visit. Seen for Dr. Tamala Julian.   He has a history of CAD (no real specifics noted), essential hypertension with severe LVH on echo, diastolic dysfunction, obesity, hyperlipidemia, OSA - on CPAP and claudication/PVD. He has PTSD and has claustrophobia. Looks like his last cath dates back to 2003.   Last seen here in February. Seemed to be doing ok.   Now referred back by PCP and perhaps the [place] as well. No records received.   Comes in today. Here alone. Says he was told to come here for an EKG. ? Of an irregular heart beat. He is taking some Garcinia to try and lose weight - it is not working. He has no real sensation of an irregular heart beat. He has gained 18 pounds. Does have some DOE - seems to be about the same since last visit. Tries to restrict his salt. Some occasional edema - but nothing that is persistent. Seems to be bothered more by chronic sinus drainage but notes that "his chest will hurt". No real exertional symptoms but he is not very active. Says his blood sugar has been ok. He admits that his portions are out of control and he really loves sweets. He says he wants to live and "need to do better".   Past Medical History:  Diagnosis Date  . Agent orange exposure   . CAD (coronary artery disease)   . CHF (congestive heart failure) ([place])   . Chronic post-traumatic stress disorder (PTSD) 03/16/2015  . Claustrophobia 03/16/2015  . Diabetes mellitus without complication ([place])   . Hyperlipidemia   . Hypertension   . Neuropathy ([place])   . Obesity   . OSA (obstructive sleep apnea)   . OSA (obstructive sleep  apnea) 03/16/2015  . PTSD (post-traumatic stress disorder)   . Recurrent boils    abceses  . Sinus infection    frequent    Past Surgical History:  Procedure Laterality Date  . CARDIAC CATHETERIZATION    . CYST EXCISION Left 2008   inguinal area  . HERNIA REPAIR       Medications: Current Outpatient Prescriptions  Medication Sig Dispense Refill  . aspirin 81 MG tablet Take 81 mg by mouth daily.    Marland Kitchen azelastine (ASTELIN) 0.1 % nasal spray Place 2 sprays into both nostrils 2 (two) times daily. Use in each nostril as directed    . chlorthalidone (HYGROTON) 50 MG tablet Take 25 mg by mouth daily.    . cholecalciferol (VITAMIN D) 1000 UNITS tablet Take 1,000 Units by mouth daily.    . cilostazol (PLETAL) 100 MG tablet TAKE 1 TABLET BY MOUTH TWICE DAILY 60 tablet 5  . citalopram (CELEXA) 20 MG tablet Take 20 mg by mouth daily.    Marland Kitchen HUMALOG KWIKPEN 100 UNIT/ML KiwkPen Inject five (5) units into the skin every morning. Inject seven (7) units into the skin around mid-day/ lunch.    . insulin glargine (LANTUS) 100 UNIT/ML injection Inject 45 Units into the skin at bedtime.    Marland Kitchen JARDIANCE 25 MG TABS tablet  Take 1 tablet by mouth daily.    Marland Kitchen l-methylfolate-B6-B12 (METANX) 3-35-2 MG TABS Take 1 tablet by mouth 2 (two) times daily.     Marland Kitchen loratadine (CLARITIN) 10 MG tablet Take 10 mg by mouth daily.    Marland Kitchen losartan (COZAAR) 50 MG tablet TAKE 1 TABLET(50 MG) BY MOUTH DAILY 90 tablet 2  . NIFEdipine (PROCARDIA XL/ADALAT-CC) 60 MG 24 hr tablet Take 30 mg by mouth daily.     . pantoprazole (PROTONIX) 20 MG tablet Take 20 mg by mouth daily.     . rosuvastatin (CRESTOR) 20 MG tablet Take 20 mg by mouth daily.     No current facility-administered medications for this visit.     Allergies: No Known Allergies  Social History: The patient  reports that he quit smoking about 27 years ago. He has never used smokeless tobacco. He reports that he does not drink alcohol or use drugs.   Family  History: The patient's family history includes Aneurysm in his father; Dementia in his sister; Healthy in his sister; Pneumonia in his mother; Sarcoidosis in his sister; Stroke in his sister; Urinary tract infection in his mother.   Review of Systems: Please see the history of present illness.   Otherwise, the review of systems is positive for none.   All other systems are reviewed and negative.   Physical Exam: VS:  BP 130/66   Pulse 79   Ht 5\' 8"  (1.727 m)   Wt 285 lb (129.3 kg)   SpO2 93%   BMI 43.33 kg/m  .  BMI Body mass index is 43.33 kg/m.  Wt Readings from Last 3 Encounters:  07/27/16 285 lb (129.3 kg)  01/03/16 267 lb 12.8 oz (121.5 kg)  11/01/15 280 lb 6.4 oz (127.2 kg)    General: Pleasant. Obese black male who is alert and in no acute distress. He has gained 18 pounds since his last visit here.   HEENT: Normal.  Neck: Supple, no JVD, carotid bruits, or masses noted.  Cardiac: Regular rate and rhythm.Heart tones are distant. Legs are full but with no significant edema.  Respiratory:  Lungs are fairly clear to auscultation bilaterally with normal work of breathing.  GI: Soft and nontender.  MS: No deformity or atrophy. Gait and ROM intact.  Skin: Warm and dry. Color is normal.  Neuro:  Strength and sensation are intact and no gross focal deficits noted.  Psych: Alert, appropriate and with normal affect.   LABORATORY DATA:  EKG:  EKG is ordered today. This demonstrates NSR with more diffuse ST/T wave changes and LVH. PACs noted.   Lab Results  Component Value Date   GLUCOSE 198 (H) 05/10/2015   NA 136 05/10/2015   K 3.9 05/10/2015   CL 103 05/10/2015   CREATININE 1.69 (H) 05/10/2015   BUN 24 (H) 05/10/2015   CO2 25 05/10/2015    BNP (last 3 results) No results for input(s): BNP in the last 8760 hours.  ProBNP (last 3 results) No results for input(s): PROBNP in the last 8760 hours.   Other Studies Reviewed Today:  Echo Study Conclusions from  03/2015  - Left ventricle: The cavity size was normal. Wall thickness was   increased in a pattern of moderate LVH. Systolic function was   normal. The estimated ejection fraction was in the range of 60%   to 65%. Wall motion was normal; there were no regional wall   motion abnormalities. Features are consistent with a pseudonormal   left ventricular  filling pattern, with concomitant abnormal   relaxation and increased filling pressure (grade 2 diastolic   dysfunction). - Pericardium, extracardiac: A trivial pericardial effusion was   identified.  SELECTIVE CORONARY ANGIOGRAPHY 04/2002: 1. Short left main.  No obstruction noted. 2. Left anterior descending coronary artery.  The LAD is diffusely involved    with plaque proximally to the midvessel.  The first diagonal branch    contains a 70% stenosis. No high grade obstruction was noted in the left    anterior descending distribution other than in the first diagonal. 3. Ramus branch.  There is a large branching ramus that arises from the left    main.  No significant abnormalities are noted. 4. Circumflex artery contains irregularities.  There is 70% narrowing in the    distal circumflex before the first obtuse marginal and the PDA.  This    stenosis is eccentric, maybe more severely narrowed than appreciated.  The    patients obesity and diaphragm prevented adequate visualization of this    particular coronary segment. 5. Right coronary artery is nondominant with 70 to 80% mid and distal    stenoses.  The acute marginal also contains 80 to 90% narrowing.  ASSESSMENT: 1. Normal left ventricular function.  Left ventricular hypertrophy noted. 2. Moderate coronary artery disease involving the nondominant right coronary,    the first diagonal, and the distal circumflex as outlined above. 3. Significant obesity preventing optimal visualization.  RECOMMENDATION:  Medical therapy, aggressive risk factor modification. Dictated by:   Illene Labrador, M.D. Attending Physician:  Belva Crome. Iii DD:  05/04/02 TD:  05/05/02  Assessment/Plan:  1. Irregular heart beat - I suspect this has been exacerbated by his supplement use of Garcinia - I have advised him to stop this.   2. Morbid obesity - discussed at length - he says he wants to change his habits and wants to do better. My tips were sharted with him today.   3. CAD - not really clear if he has chest pain or not - hard for him to discern with his sinus issues. Multiple CV risk factors. Will try to get a Myoview and look for high risk findings.    4. PVD/Claudication ([place]) - not discussed at our visit today - he is to see Dr. Gwenlyn Found later this month.   5. Hypertensive heart disease - with severe LVH/diastolic dysfunction - BP ok on his current regimen.    4. OSA (obstructive sleep apnea)  compliant with C Pap  5. Carotid stenosis, bilateral  asymptomatic - needs repeat study 03/2017  Current medicines are reviewed with the patient today.  The patient does not have concerns regarding medicines other than what has been noted above.  The following changes have been made:  See above.  Labs/ tests ordered today include:    Orders Placed This Encounter  Procedures  . Myocardial Perfusion Imaging  . EKG 12-Lead     Disposition:   FU with Dr. Tamala Julian as planned in December.    Patient is agreeable to this plan and will call if any problems develop in the interim.   Signed: Burtis Junes, RN, ANP-C 07/27/2016 11:58 AM  [facility] [address] [place], [place]  [address] Phone: [phone] Fax: [phone]

## 2016-08-02 ENCOUNTER — Telehealth (HOSPITAL_COMMUNITY): Payer: Self-pay | Admitting: *Deleted

## 2016-08-02 NOTE — Telephone Encounter (Signed)
Patient given detailed instructions per Myocardial Perfusion Study Information Sheet for the test on 08/06/16. Patient notified to arrive 15 minutes early and that it is imperative to arrive on time for appointment to keep from having the test rescheduled.  If you need to cancel or reschedule your appointment, please call the office within 24 hours of your appointment. Failure to do so may result in a cancellation of your appointment, and a $50 no show fee. Patient verbalized understanding. Saraann Enneking J Hero Kulish, RN   

## 2016-08-06 ENCOUNTER — Ambulatory Visit (HOSPITAL_COMMUNITY): Payer: Medicare Other | Attending: Cardiovascular Disease

## 2016-08-06 DIAGNOSIS — I779 Disorder of arteries and arterioles, unspecified: Secondary | ICD-10-CM | POA: Diagnosis not present

## 2016-08-06 DIAGNOSIS — I10 Essential (primary) hypertension: Secondary | ICD-10-CM | POA: Diagnosis not present

## 2016-08-06 DIAGNOSIS — I2510 Atherosclerotic heart disease of native coronary artery without angina pectoris: Secondary | ICD-10-CM

## 2016-08-06 DIAGNOSIS — R002 Palpitations: Secondary | ICD-10-CM | POA: Diagnosis not present

## 2016-08-06 DIAGNOSIS — R9439 Abnormal result of other cardiovascular function study: Secondary | ICD-10-CM | POA: Insufficient documentation

## 2016-08-06 DIAGNOSIS — E109 Type 1 diabetes mellitus without complications: Secondary | ICD-10-CM | POA: Insufficient documentation

## 2016-08-06 MED ORDER — REGADENOSON 0.4 MG/5ML IV SOLN
0.4000 mg | Freq: Once | INTRAVENOUS | Status: AC
  Administered 2016-08-06: 0.4 mg via INTRAVENOUS

## 2016-08-06 MED ORDER — TECHNETIUM TC 99M TETROFOSMIN IV KIT
32.9000 | PACK | Freq: Once | INTRAVENOUS | Status: AC | PRN
  Administered 2016-08-06: 32.9 via INTRAVENOUS
  Filled 2016-08-06: qty 33

## 2016-08-07 ENCOUNTER — Ambulatory Visit (INDEPENDENT_AMBULATORY_CARE_PROVIDER_SITE_OTHER): Payer: Medicare Other | Admitting: Cardiovascular Disease

## 2016-08-07 ENCOUNTER — Encounter: Payer: Self-pay | Admitting: Cardiovascular Disease

## 2016-08-07 ENCOUNTER — Telehealth: Payer: Self-pay | Admitting: *Deleted

## 2016-08-07 ENCOUNTER — Other Ambulatory Visit: Payer: Self-pay | Admitting: Cardiovascular Disease

## 2016-08-07 ENCOUNTER — Other Ambulatory Visit: Payer: Self-pay | Admitting: *Deleted

## 2016-08-07 ENCOUNTER — Ambulatory Visit (HOSPITAL_COMMUNITY): Payer: Medicare Other | Attending: Cardiology

## 2016-08-07 VITALS — BP 132/72 | Ht 68.0 in | Wt 282.0 lb

## 2016-08-07 DIAGNOSIS — I739 Peripheral vascular disease, unspecified: Secondary | ICD-10-CM

## 2016-08-07 DIAGNOSIS — Z01818 Encounter for other preprocedural examination: Secondary | ICD-10-CM

## 2016-08-07 DIAGNOSIS — Z7902 Long term (current) use of antithrombotics/antiplatelets: Secondary | ICD-10-CM | POA: Diagnosis not present

## 2016-08-07 DIAGNOSIS — Z0000 Encounter for general adult medical examination without abnormal findings: Secondary | ICD-10-CM

## 2016-08-07 LAB — MYOCARDIAL PERFUSION IMAGING
LV dias vol: 128 mL (ref 62–150)
LV sys vol: 56 mL
Peak HR: 106 {beats}/min
RATE: 0.32
Rest HR: 78 {beats}/min
SDS: 0
SRS: 9
SSS: 9
TID: 1.12

## 2016-08-07 MED ORDER — TECHNETIUM TC 99M TETROFOSMIN IV KIT
32.7000 | PACK | Freq: Once | INTRAVENOUS | Status: AC | PRN
  Administered 2016-08-07: 32.7 via INTRAVENOUS
  Filled 2016-08-07: qty 33

## 2016-08-07 NOTE — Telephone Encounter (Signed)
Left message for [name] to call regarding procedure for 08/27/16

## 2016-08-07 NOTE — Patient Instructions (Addendum)
Medication Instructions:  Your physician recommends that you continue on your current medications as directed. Please refer to the Current Medication list given to you today.  PRIOR TO PROCEDURE: 1- The night before procedure, take 1/2 your dose of Lantus. 2- The day of procedure, do not take insulin.  Testing/Procedures: Your physician has requested that you have a lower extremity arterial doppler- During this test, ultrasound is used to evaluate arterial blood flow in the legs. Allow approximately one hour for this exam. NEXT WEEK PLEASE, NEEDS TO BE BEFORE PROCEDURE.   Dr. Gwenlyn Found has ordered a peripheral angiogram to be done at [facility].  This procedure is going to look at the bloodflow in your lower extremities.  If Dr. Gwenlyn Found is able to open up the arteries, you will have to spend one night in the hospital.  If he is not able to open the arteries, you will be able to go home that same day.  SCHEDULE FOR 08/27/16.  After the procedure, you will not be allowed to drive for 3 days or push, pull, or lift anything greater than 10 lbs for one week.    You will be required to have the following tests prior to the procedure:  1. Blood work-the blood work can be done no more than 14 days prior to the procedure.  It can be done at any [facility] lab.  There is one downstairs on the first floor of this building and one in the [facility] building [id number])  2. Chest Xray-the chest xray order has already been placed at the [facility].     *REPS   SCOTT  Puncture site RIGHT GROIN   If you need a refill on your cardiac medications before your next appointment, please call your pharmacy.

## 2016-08-07 NOTE — Addendum Note (Signed)
Addended by: Vanessa Ralphs on: 08/07/2016 03:24 PM   Modules accepted: Orders

## 2016-08-07 NOTE — Progress Notes (Signed)
08/07/2016 Joel Rivera.   [birth year]  [id number]  Primary Physician Jani Gravel, MD Primary Cardiologist: Lorretta Harp MD Renae Gloss  HPI:  Joel Rivera is a very pleasant 70 year old severely overweight married African-American male father of 55, grandfather 3 grandchildren referred to me by Dr. Pernell Dupre for peripheral vascular evaluation. He has history of CAD status post chronic catheterization in 2003. He has a remote history of tobacco abuse, treated hypertension, hypokalemia and diabetes. Lifestyle limiting claudication left greater than right for last 3 years. Dopplers performed 03/29/15 revealed a right ABI 0.79 with a high-frequency signal in the distal right SFA and left ABI of 0.6 with an occluded distal left SFA   Current Outpatient Prescriptions  Medication Sig Dispense Refill  . aspirin 81 MG tablet Take 81 mg by mouth daily.    Marland Kitchen azelastine (ASTELIN) 0.1 % nasal spray Place 2 sprays into both nostrils 2 (two) times daily. Use in each nostril as directed    . chlorthalidone (HYGROTON) 50 MG tablet Take 25 mg by mouth daily.    . cholecalciferol (VITAMIN D) 1000 UNITS tablet Take 1,000 Units by mouth daily.    . cilostazol (PLETAL) 100 MG tablet TAKE 1 TABLET BY MOUTH TWICE DAILY 60 tablet 5  . citalopram (CELEXA) 20 MG tablet Take 20 mg by mouth daily.    Marland Kitchen HUMALOG KWIKPEN 100 UNIT/ML KiwkPen Inject five (5) units into the skin every morning. Inject seven (7) units into the skin around mid-day/ lunch.    . insulin glargine (LANTUS) 100 UNIT/ML injection Inject 45 Units into the skin at bedtime.    Marland Kitchen JARDIANCE 25 MG TABS tablet Take 1 tablet by mouth daily.    Marland Kitchen l-methylfolate-B6-B12 (METANX) 3-35-2 MG TABS Take 1 tablet by mouth 2 (two) times daily.     Marland Kitchen loratadine (CLARITIN) 10 MG tablet Take 10 mg by mouth daily.    Marland Kitchen losartan (COZAAR) 50 MG tablet TAKE 1 TABLET(50 MG) BY MOUTH DAILY 90 tablet 2  . NIFEdipine (PROCARDIA XL/ADALAT-CC) 60 MG 24 hr tablet  Take 30 mg by mouth daily.     . pantoprazole (PROTONIX) 20 MG tablet Take 20 mg by mouth daily.     . rosuvastatin (CRESTOR) 20 MG tablet Take 20 mg by mouth daily.     No current facility-administered medications for this visit.     No Known Allergies  Social History   Social History  . Marital status: Married    Spouse name: N/A  . Number of children: N/A  . Years of education: N/A   Occupational History  . Not on file.   Social History Main Topics  . Smoking status: Former Smoker    Quit date: 11/01/1988  . Smokeless tobacco: Never Used  . Alcohol use No  . Drug use: No  . Sexual activity: Yes    Birth control/ protection: Condom   Other Topics Concern  . Not on file   Social History Narrative   No caffeine.     Review of Systems: General: negative for chills, fever, night sweats or weight changes.  Cardiovascular: negative for chest pain, dyspnea on exertion, edema, orthopnea, palpitations, paroxysmal nocturnal dyspnea or shortness of breath Dermatological: negative for rash Respiratory: negative for cough or wheezing Urologic: negative for hematuria Abdominal: negative for nausea, vomiting, diarrhea, bright red blood per rectum, melena, or hematemesis Neurologic: negative for visual changes, syncope, or dizziness All other systems reviewed and are otherwise negative except  as noted above.    Blood pressure 132/72, height 5\' 8"  (1.727 m), weight 282 lb (127.9 kg).  General appearance: alert and no distress Neck: no adenopathy, no JVD, supple, symmetrical, trachea midline, thyroid not enlarged, symmetric, no tenderness/mass/nodules and Soft right carotid bruit Lungs: clear to auscultation bilaterally Heart: regular rate and rhythm, S1, S2 normal, no murmur, click, rub or gallop Extremities: extremities normal, atraumatic, no cyanosis or edema  EKG not performed today  ASSESSMENT AND PLAN:   Peripheral arterial disease [facility]) Mr. Kiesewetter was referred to me  by Dr. Tamala Julian for evaluation of PAD. He's had left greater than right lower extremity lifestyle limiting claudication for the last 2-3 years. He does have positive risk factors. Dopplers performed 03/29/15 revealed a right ABI 0.79 with a moderately elevated velocity signal in the distal right SFA and a left ABI 0.6 with an occluded distal left SFA. He says that his legs prevented him from doing his normal activities such as mowing his lawn. He does have stage III's CKD we will need to follow his serum creatinine. I'm going to arrange for him undergo repeat Dopplers followed by angiography and potential intervention.      Lorretta Harp MD FACP,FACC,FAHA, [facility] 08/07/2016 3:12 PM

## 2016-08-07 NOTE — Assessment & Plan Note (Signed)
Joel Rivera was referred to me by Dr. Tamala Julian for evaluation of PAD. He's had left greater than right lower extremity lifestyle limiting claudication for the last 2-3 years. He does have positive risk factors. Dopplers performed 03/29/15 revealed a right ABI 0.79 with a moderately elevated velocity signal in the distal right SFA and a left ABI 0.6 with an occluded distal left SFA. He says that his legs prevented him from doing his normal activities such as mowing his lawn. He does have stage III's CKD we will need to follow his serum creatinine. I'm going to arrange for him undergo repeat Dopplers followed by angiography and potential intervention.

## 2016-08-08 ENCOUNTER — Telehealth: Payer: Self-pay | Admitting: Interventional Cardiology

## 2016-08-08 ENCOUNTER — Telehealth: Payer: Self-pay | Admitting: Cardiovascular Disease

## 2016-08-08 NOTE — Telephone Encounter (Signed)
Spoke with Scott @ 8:03 am 08/08/16.  Confirmed for 08/27/16.

## 2016-08-08 NOTE — Telephone Encounter (Signed)
New Message  Pt call requesting to speak with RN. Pt did not want to discloseany further information with scheduler. please call back to discuss

## 2016-08-08 NOTE — Telephone Encounter (Signed)
Pt had questions regarding time/location of his upcoming arterial and angiography. Questions addressed, pt verbalized understanding and thanks.

## 2016-08-08 NOTE — Telephone Encounter (Signed)
Follow Up:    Wife called again,says pt is throwing a fit about getting his results.

## 2016-08-08 NOTE — Telephone Encounter (Signed)
New Message  Pts wife voiced she is returning nurses call from today.  Please f/u with pts

## 2016-08-10 NOTE — Telephone Encounter (Signed)
Notes Recorded by Tamsen Snider on 08/08/2016 at 4:35 PM EDT Pt aware of stress test results. Discussed risked factors; diet, exercise.

## 2016-08-15 ENCOUNTER — Ambulatory Visit (HOSPITAL_COMMUNITY)
Admission: RE | Admit: 2016-08-15 | Discharge: 2016-08-15 | Disposition: A | Discharge status: Home / Self Care | Payer: Medicare Other | Source: Ambulatory Visit | Attending: Cardiovascular Disease | Admitting: Cardiovascular Disease

## 2016-08-15 DIAGNOSIS — I739 Peripheral vascular disease, unspecified: Secondary | ICD-10-CM | POA: Diagnosis not present

## 2016-08-21 ENCOUNTER — Ambulatory Visit
Admission: RE | Admit: 2016-08-21 | Discharge: 2016-08-21 | Disposition: A | Discharge status: Home / Self Care | Payer: Medicare Other | Source: Ambulatory Visit | Attending: Cardiovascular Disease | Admitting: Cardiovascular Disease

## 2016-08-21 DIAGNOSIS — R918 Other nonspecific abnormal finding of lung field: Secondary | ICD-10-CM | POA: Diagnosis not present

## 2016-08-21 DIAGNOSIS — Z01818 Encounter for other preprocedural examination: Secondary | ICD-10-CM

## 2016-08-21 DIAGNOSIS — Z7902 Long term (current) use of antithrombotics/antiplatelets: Secondary | ICD-10-CM | POA: Diagnosis not present

## 2016-08-21 DIAGNOSIS — I739 Peripheral vascular disease, unspecified: Secondary | ICD-10-CM

## 2016-08-21 DIAGNOSIS — Z0000 Encounter for general adult medical examination without abnormal findings: Secondary | ICD-10-CM | POA: Diagnosis not present

## 2016-08-21 LAB — CBC WITH DIFFERENTIAL/PLATELET
Basophils Absolute: 0 cells/uL (ref 0–200)
Basophils Relative: 0 %
Eosinophils Absolute: 78 cells/uL (ref 15–500)
Eosinophils Relative: 1 %
HCT: 43.2 % (ref 38.5–50.0)
Hemoglobin: 14.1 g/dL (ref 13.2–17.1)
Lymphocytes Relative: 42 %
Lymphs Abs: 3276 cells/uL (ref 850–3900)
MCH: 29.4 pg (ref 27.0–33.0)
MCHC: 32.6 g/dL (ref 32.0–36.0)
MCV: 90.2 fL (ref 80.0–100.0)
MPV: 10.3 fL (ref 7.5–12.5)
Monocytes Absolute: 546 cells/uL (ref 200–950)
Monocytes Relative: 7 %
Neutro Abs: 3900 cells/uL (ref 1500–7800)
Neutrophils Relative %: 50 %
Platelets: 173 10*3/uL (ref 140–400)
RBC: 4.79 MIL/uL (ref 4.20–5.80)
RDW: 13.6 % (ref 11.0–15.0)
WBC: 7.8 10*3/uL (ref 3.8–10.8)

## 2016-08-21 LAB — TSH: TSH: 3.06 mIU/L (ref 0.40–4.50)

## 2016-08-22 ENCOUNTER — Telehealth: Payer: Self-pay | Admitting: *Deleted

## 2016-08-22 ENCOUNTER — Telehealth: Payer: Self-pay | Admitting: Cardiovascular Disease

## 2016-08-22 DIAGNOSIS — E119 Type 2 diabetes mellitus without complications: Secondary | ICD-10-CM | POA: Diagnosis not present

## 2016-08-22 DIAGNOSIS — I10 Essential (primary) hypertension: Secondary | ICD-10-CM | POA: Diagnosis not present

## 2016-08-22 LAB — PROTIME-INR
INR: 1
Prothrombin Time: 10.7 s (ref 9.0–11.5)

## 2016-08-22 LAB — BASIC METABOLIC PANEL
BUN: 19 mg/dL (ref 7–25)
CO2: 27 mmol/L (ref 20–31)
Calcium: 9.3 mg/dL (ref 8.6–10.3)
Chloride: 103 mmol/L (ref 98–110)
Creat: 1.61 mg/dL — ABNORMAL HIGH (ref 0.70–1.18)
Glucose, Bld: 107 mg/dL — ABNORMAL HIGH (ref 65–99)
Potassium: 4 mmol/L (ref 3.5–5.3)
Sodium: 140 mmol/L (ref 135–146)

## 2016-08-22 LAB — APTT: aPTT: 27 s (ref 22–34)

## 2016-08-22 NOTE — Telephone Encounter (Signed)
-----   Message from Lorretta Harp, MD sent at 08/22/2016  1:34 PM EDT ----- Serum creatinine mildly elevated. We'll hold his losartan Thursday prior to his procedure and arrange for him to come in on the Sunday prior to his money procedure for hydration. We will check BMET first thing in the morning.

## 2016-08-22 NOTE — Telephone Encounter (Signed)
While calling patient for results, pt's wife answered, ok per DPR. She expressed concern about her husband and his upcoming procedure. She explained the pt has severe PTSD and diagnosed with Agent Orange after retirement from the [employer]. Patient tends to get agitated and irritable when going through things such as testing and procedures. She expressed she wanted the pt to stay the night in the hospital the night after the procedure as she is going through breast cancer at this time and it is difficult on her. I explained that if Dr Gwenlyn Found performed an intervention then the pt would stay the night but I could not guarantee there being a medical reason for a hospital stay otherwise. She verbalized understanding. She wanted to make sure Dr Gwenlyn Found was aware.  Will route to Dr Gwenlyn Found to review.

## 2016-08-22 NOTE — Telephone Encounter (Signed)
Thanks for letting me know. We will keep him overnight.

## 2016-08-22 NOTE — Telephone Encounter (Signed)
Please call,she said it was personal,she would not give me any details.

## 2016-08-22 NOTE — Telephone Encounter (Signed)
Called and spoke with patient's wife ok per DPR. Gave her Dr Kennon Holter instructions regarding bloodwork. She verbalized understanding to have the patient stop the losartan starting tomorrow and for the patient to be pre-admitted for hydration on Sunday evening. She is aware someone from bed control will call the patient on Sunday to give him a time to come in that afternoon. She will let the patient know all this information.  I also notified her that Dr Gwenlyn Found had reviewed her request in previous encounter from earlier today that patient.

## 2016-08-23 NOTE — Telephone Encounter (Signed)
Bed control notified about patient coming in on 08/26/16 for prehydration. Cardmaster notified as well.

## 2016-08-26 ENCOUNTER — Observation Stay (HOSPITAL_COMMUNITY)
Admission: RE | Admit: 2016-08-26 | Discharge: 2016-08-27 | Disposition: A | Discharge status: Home / Self Care | Payer: Medicare Other | Source: Ambulatory Visit | Attending: Cardiovascular Disease | Admitting: Cardiovascular Disease

## 2016-08-26 ENCOUNTER — Emergency Department (HOSPITAL_COMMUNITY)
Admission: EM | Admit: 2016-08-26 | Discharge: 2016-08-26 | Discharge status: Absent without leave | Payer: Medicare Other

## 2016-08-26 DIAGNOSIS — I1310 Hypertensive heart and chronic kidney disease without heart failure, with stage 1 through stage 4 chronic kidney disease, or unspecified chronic kidney disease: Secondary | ICD-10-CM | POA: Insufficient documentation

## 2016-08-26 DIAGNOSIS — Z87891 Personal history of nicotine dependence: Secondary | ICD-10-CM | POA: Insufficient documentation

## 2016-08-26 DIAGNOSIS — I739 Peripheral vascular disease, unspecified: Secondary | ICD-10-CM | POA: Diagnosis present

## 2016-08-26 DIAGNOSIS — E1122 Type 2 diabetes mellitus with diabetic chronic kidney disease: Secondary | ICD-10-CM | POA: Diagnosis not present

## 2016-08-26 DIAGNOSIS — I119 Hypertensive heart disease without heart failure: Secondary | ICD-10-CM | POA: Diagnosis present

## 2016-08-26 DIAGNOSIS — N183 Chronic kidney disease, stage 3 (moderate): Secondary | ICD-10-CM | POA: Diagnosis not present

## 2016-08-26 DIAGNOSIS — Z7982 Long term (current) use of aspirin: Secondary | ICD-10-CM | POA: Insufficient documentation

## 2016-08-26 DIAGNOSIS — Z79899 Other long term (current) drug therapy: Secondary | ICD-10-CM | POA: Diagnosis not present

## 2016-08-26 DIAGNOSIS — I441 Atrioventricular block, second degree: Secondary | ICD-10-CM | POA: Insufficient documentation

## 2016-08-26 DIAGNOSIS — I2510 Atherosclerotic heart disease of native coronary artery without angina pectoris: Secondary | ICD-10-CM | POA: Diagnosis not present

## 2016-08-26 DIAGNOSIS — Z7902 Long term (current) use of antithrombotics/antiplatelets: Secondary | ICD-10-CM | POA: Insufficient documentation

## 2016-08-26 DIAGNOSIS — Z6841 Body Mass Index (BMI) 40.0 and over, adult: Secondary | ICD-10-CM | POA: Insufficient documentation

## 2016-08-26 DIAGNOSIS — Z794 Long term (current) use of insulin: Secondary | ICD-10-CM | POA: Insufficient documentation

## 2016-08-26 DIAGNOSIS — E663 Overweight: Secondary | ICD-10-CM | POA: Insufficient documentation

## 2016-08-26 DIAGNOSIS — I70213 Atherosclerosis of native arteries of extremities with intermittent claudication, bilateral legs: Principal | ICD-10-CM | POA: Insufficient documentation

## 2016-08-26 DIAGNOSIS — E785 Hyperlipidemia, unspecified: Secondary | ICD-10-CM | POA: Diagnosis not present

## 2016-08-26 LAB — GLUCOSE, CAPILLARY
Glucose-Capillary: 143 mg/dL — ABNORMAL HIGH (ref 65–99)
Glucose-Capillary: 129 mg/dL — ABNORMAL HIGH (ref 65–99)

## 2016-08-26 LAB — CBC
HCT: 40.8 % (ref 39.0–52.0)
Hemoglobin: 13.4 g/dL (ref 13.0–17.0)
MCH: 29.3 pg (ref 26.0–34.0)
MCHC: 32.8 g/dL (ref 30.0–36.0)
MCV: 89.1 fL (ref 78.0–100.0)
Platelets: 168 10*3/uL (ref 150–400)
RBC: 4.58 MIL/uL (ref 4.22–5.81)
RDW: 13.1 % (ref 11.5–15.5)
WBC: 8.9 10*3/uL (ref 4.0–10.5)

## 2016-08-26 LAB — BASIC METABOLIC PANEL
Anion gap: 7 (ref 5–15)
BUN: 17 mg/dL (ref 6–20)
CO2: 24 mmol/L (ref 22–32)
Calcium: 9.1 mg/dL (ref 8.9–10.3)
Chloride: 108 mmol/L (ref 101–111)
Creatinine, Ser: 1.43 mg/dL — ABNORMAL HIGH (ref 0.61–1.24)
GFR calc Af Amer: 55 mL/min — ABNORMAL LOW (ref >60)
GFR calc non Af Amer: 47 mL/min — ABNORMAL LOW (ref >60)
Glucose, Bld: 164 mg/dL — ABNORMAL HIGH (ref 65–99)
Potassium: 3.8 mmol/L (ref 3.5–5.1)
Sodium: 139 mmol/L (ref 135–145)

## 2016-08-26 LAB — PROTIME-INR
INR: 0.97
Prothrombin Time: 12.9 seconds (ref 11.4–15.2)

## 2016-08-26 MED ORDER — SODIUM CHLORIDE 0.9 % WEIGHT BASED INFUSION
1.0000 mL/kg/h | INTRAVENOUS | Status: DC
  Administered 2016-08-27 (×2): 1 mL/kg/h via INTRAVENOUS

## 2016-08-26 MED ORDER — SODIUM CHLORIDE 0.9 % WEIGHT BASED INFUSION
3.0000 mL/kg/h | INTRAVENOUS | Status: DC
  Administered 2016-08-27: 3 mL/kg/h via INTRAVENOUS

## 2016-08-26 MED ORDER — ROSUVASTATIN CALCIUM 10 MG PO TABS
20.0000 mg | ORAL_TABLET | Freq: Every day | ORAL | Status: DC
  Administered 2016-08-27: 20 mg via ORAL
  Filled 2016-08-26: qty 2

## 2016-08-26 MED ORDER — SODIUM CHLORIDE 0.9% FLUSH: 3.0000 mL | INTRAVENOUS | Status: DC | PRN

## 2016-08-26 MED ORDER — METOPROLOL TARTRATE 25 MG PO TABS
25.0000 mg | ORAL_TABLET | Freq: Two times a day (BID) | ORAL | Status: DC
  Administered 2016-08-27: 25 mg via ORAL
  Filled 2016-08-26: qty 1

## 2016-08-26 MED ORDER — ASPIRIN 81 MG PO CHEW
81.0000 mg | CHEWABLE_TABLET | ORAL | Status: AC
  Administered 2016-08-27: 81 mg via ORAL
  Filled 2016-08-26: qty 1

## 2016-08-27 ENCOUNTER — Encounter (HOSPITAL_COMMUNITY)
Admission: RE | Disposition: A | Discharge status: Home / Self Care | Payer: Self-pay | Source: Ambulatory Visit | Attending: Cardiovascular Disease

## 2016-08-27 ENCOUNTER — Encounter (HOSPITAL_COMMUNITY): Payer: Self-pay

## 2016-08-27 DIAGNOSIS — Z7982 Long term (current) use of aspirin: Secondary | ICD-10-CM | POA: Diagnosis not present

## 2016-08-27 DIAGNOSIS — I739 Peripheral vascular disease, unspecified: Secondary | ICD-10-CM | POA: Diagnosis not present

## 2016-08-27 DIAGNOSIS — I2510 Atherosclerotic heart disease of native coronary artery without angina pectoris: Secondary | ICD-10-CM | POA: Diagnosis not present

## 2016-08-27 DIAGNOSIS — Z87891 Personal history of nicotine dependence: Secondary | ICD-10-CM | POA: Diagnosis not present

## 2016-08-27 DIAGNOSIS — N183 Chronic kidney disease, stage 3 (moderate): Secondary | ICD-10-CM | POA: Diagnosis not present

## 2016-08-27 DIAGNOSIS — Z794 Long term (current) use of insulin: Secondary | ICD-10-CM | POA: Diagnosis not present

## 2016-08-27 DIAGNOSIS — Z79899 Other long term (current) drug therapy: Secondary | ICD-10-CM | POA: Diagnosis not present

## 2016-08-27 DIAGNOSIS — I70213 Atherosclerosis of native arteries of extremities with intermittent claudication, bilateral legs: Secondary | ICD-10-CM | POA: Diagnosis not present

## 2016-08-27 DIAGNOSIS — E785 Hyperlipidemia, unspecified: Secondary | ICD-10-CM | POA: Diagnosis not present

## 2016-08-27 DIAGNOSIS — I119 Hypertensive heart disease without heart failure: Secondary | ICD-10-CM

## 2016-08-27 DIAGNOSIS — I1310 Hypertensive heart and chronic kidney disease without heart failure, with stage 1 through stage 4 chronic kidney disease, or unspecified chronic kidney disease: Secondary | ICD-10-CM | POA: Diagnosis not present

## 2016-08-27 DIAGNOSIS — Z7902 Long term (current) use of antithrombotics/antiplatelets: Secondary | ICD-10-CM | POA: Diagnosis not present

## 2016-08-27 DIAGNOSIS — I441 Atrioventricular block, second degree: Secondary | ICD-10-CM | POA: Diagnosis not present

## 2016-08-27 DIAGNOSIS — E1122 Type 2 diabetes mellitus with diabetic chronic kidney disease: Secondary | ICD-10-CM | POA: Diagnosis not present

## 2016-08-27 HISTORY — PX: PERIPHERAL VASCULAR CATHETERIZATION: SHX172C

## 2016-08-27 LAB — GLUCOSE, CAPILLARY
Glucose-Capillary: 103 mg/dL — ABNORMAL HIGH (ref 65–99)
Glucose-Capillary: 113 mg/dL — ABNORMAL HIGH (ref 65–99)
Glucose-Capillary: 165 mg/dL — ABNORMAL HIGH (ref 65–99)

## 2016-08-27 LAB — BASIC METABOLIC PANEL
Anion gap: 9 (ref 5–15)
BUN: 17 mg/dL (ref 6–20)
CO2: 21 mmol/L — ABNORMAL LOW (ref 22–32)
Calcium: 8.9 mg/dL (ref 8.9–10.3)
Chloride: 108 mmol/L (ref 101–111)
Creatinine, Ser: 1.21 mg/dL (ref 0.61–1.24)
GFR calc Af Amer: >60 mL/min (ref >60)
GFR calc non Af Amer: 58 mL/min — ABNORMAL LOW (ref >60)
Glucose, Bld: 111 mg/dL — ABNORMAL HIGH (ref 65–99)
Potassium: 4.3 mmol/L (ref 3.5–5.1)
Sodium: 138 mmol/L (ref 135–145)

## 2016-08-27 LAB — MRSA PCR SCREENING: MRSA by PCR: NEGATIVE

## 2016-08-27 SURGERY — LOWER EXTREMITY ANGIOGRAPHY

## 2016-08-27 MED ORDER — L-METHYLFOLATE-B6-B12 3-35-2 MG PO TABS
1.0000 | ORAL_TABLET | Freq: Two times a day (BID) | ORAL | Status: DC
  Filled 2016-08-27: qty 1

## 2016-08-27 MED ORDER — PRAZOSIN HCL 2 MG PO CAPS
3.0000 mg | ORAL_CAPSULE | Freq: Every day | ORAL | Status: DC
  Filled 2016-08-27: qty 1

## 2016-08-27 MED ORDER — HEPARIN (PORCINE) IN NACL 2-0.9 UNIT/ML-% IJ SOLN
INTRAMUSCULAR | Status: DC | PRN
  Administered 2016-08-27: 1000 mL

## 2016-08-27 MED ORDER — ACETAMINOPHEN 325 MG PO TABS: 650.0000 mg | ORAL_TABLET | ORAL | Status: DC | PRN

## 2016-08-27 MED ORDER — PANTOPRAZOLE SODIUM 20 MG PO TBEC: 20.0000 mg | DELAYED_RELEASE_TABLET | Freq: Every day | ORAL | Status: DC

## 2016-08-27 MED ORDER — ROSUVASTATIN CALCIUM 10 MG PO TABS: 40.0000 mg | ORAL_TABLET | Freq: Every day | ORAL | Status: DC

## 2016-08-27 MED ORDER — CANAGLIFLOZIN 100 MG PO TABS: 100.0000 mg | ORAL_TABLET | Freq: Every day | ORAL | Status: DC

## 2016-08-27 MED ORDER — VITAMIN D 1000 UNITS PO TABS
1000.0000 [IU] | ORAL_TABLET | Freq: Every day | ORAL | Status: DC
  Administered 2016-08-27: 1000 [IU] via ORAL
  Filled 2016-08-27: qty 1

## 2016-08-27 MED ORDER — ACETAMINOPHEN 325 MG PO TABS: 650.0000 mg | ORAL_TABLET | Freq: Four times a day (QID) | ORAL | Status: DC | PRN

## 2016-08-27 MED ORDER — LOSARTAN POTASSIUM 50 MG PO TABS: 50.0000 mg | ORAL_TABLET | Freq: Every day | ORAL | Status: DC

## 2016-08-27 MED ORDER — INSULIN ASPART 100 UNIT/ML ~~LOC~~ SOLN
0.0000 [IU] | Freq: Three times a day (TID) | SUBCUTANEOUS | Status: DC
Start: 2016-08-27 — End: 2016-08-27

## 2016-08-27 MED ORDER — INSULIN ASPART 100 UNIT/ML ~~LOC~~ SOLN: 0.0000 [IU] | Freq: Three times a day (TID) | SUBCUTANEOUS | Status: DC

## 2016-08-27 MED ORDER — VITAMIN D 1000 UNITS PO TABS: 1000.0000 [IU] | ORAL_TABLET | Freq: Every day | ORAL | Status: DC

## 2016-08-27 MED ORDER — CITALOPRAM HYDROBROMIDE 20 MG PO TABS: 10.0000 mg | ORAL_TABLET | Freq: Every day | ORAL | Status: DC

## 2016-08-27 MED ORDER — LIDOCAINE HCL (PF) 1 % IJ SOLN
INTRAMUSCULAR | Status: DC | PRN
  Administered 2016-08-27: 25 mL

## 2016-08-27 MED ORDER — CHLORTHALIDONE 25 MG PO TABS
25.0000 mg | ORAL_TABLET | ORAL | Status: DC
  Administered 2016-08-27: 25 mg via ORAL
  Filled 2016-08-27: qty 1

## 2016-08-27 MED ORDER — FENTANYL CITRATE (PF) 100 MCG/2ML IJ SOLN
INTRAMUSCULAR | Status: DC | PRN
  Administered 2016-08-27: 25 ug via INTRAVENOUS

## 2016-08-27 MED ORDER — HEPARIN (PORCINE) IN NACL 2-0.9 UNIT/ML-% IJ SOLN
INTRAMUSCULAR | Status: AC
  Filled 2016-08-27: qty 1000

## 2016-08-27 MED ORDER — SODIUM CHLORIDE 0.9 % IV SOLN
INTRAVENOUS | Status: AC
  Administered 2016-08-27: 13:00:00 via INTRAVENOUS

## 2016-08-27 MED ORDER — ROSUVASTATIN CALCIUM 10 MG PO TABS
40.0000 mg | ORAL_TABLET | Freq: Every day | ORAL | Status: DC
  Administered 2016-08-27: 40 mg via ORAL
  Filled 2016-08-27: qty 4

## 2016-08-27 MED ORDER — INSULIN GLARGINE 100 UNIT/ML ~~LOC~~ SOLN
20.0000 [IU] | Freq: Every day | SUBCUTANEOUS | Status: DC
  Filled 2016-08-27: qty 0.2

## 2016-08-27 MED ORDER — LOSARTAN POTASSIUM 50 MG PO TABS
50.0000 mg | ORAL_TABLET | Freq: Every day | ORAL | Status: DC
  Administered 2016-08-27: 50 mg via ORAL
  Filled 2016-08-27: qty 1

## 2016-08-27 MED ORDER — CILOSTAZOL 100 MG PO TABS
100.0000 mg | ORAL_TABLET | Freq: Two times a day (BID) | ORAL | Status: DC
  Filled 2016-08-27: qty 1

## 2016-08-27 MED ORDER — NIFEDIPINE ER OSMOTIC RELEASE 30 MG PO TB24
30.0000 mg | ORAL_TABLET | Freq: Every day | ORAL | Status: DC
  Administered 2016-08-27: 30 mg via ORAL
  Filled 2016-08-27: qty 1

## 2016-08-27 MED ORDER — NIFEDIPINE ER OSMOTIC RELEASE 30 MG PO TB24: 30.0000 mg | ORAL_TABLET | Freq: Every day | ORAL | Status: DC

## 2016-08-27 MED ORDER — INSULIN ASPART 100 UNIT/ML ~~LOC~~ SOLN: 0.0000 [IU] | Freq: Every day | SUBCUTANEOUS | Status: DC

## 2016-08-27 MED ORDER — LORATADINE 10 MG PO TABS: 10.0000 mg | ORAL_TABLET | Freq: Every day | ORAL | Status: DC

## 2016-08-27 MED ORDER — SALINE SPRAY 0.65 % NA SOLN: 1.0000 | Freq: Three times a day (TID) | NASAL | Status: DC | PRN

## 2016-08-27 MED ORDER — CHLORTHALIDONE 25 MG PO TABS
25.0000 mg | ORAL_TABLET | ORAL | Status: DC
  Filled 2016-08-27: qty 1

## 2016-08-27 MED ORDER — ASPIRIN EC 81 MG PO TBEC
81.0000 mg | DELAYED_RELEASE_TABLET | Freq: Every day | ORAL | Status: DC
  Filled 2016-08-27: qty 1

## 2016-08-27 MED ORDER — MONTELUKAST SODIUM 10 MG PO TABS: 10.0000 mg | ORAL_TABLET | Freq: Every day | ORAL | Status: DC

## 2016-08-27 MED ORDER — IODIXANOL 320 MG/ML IV SOLN
INTRAVENOUS | Status: DC | PRN
Start: 2016-08-27 — End: 2016-08-27
  Administered 2016-08-27: 115 mL via INTRA_ARTERIAL

## 2016-08-27 MED ORDER — MIDAZOLAM HCL 2 MG/2ML IJ SOLN
INTRAMUSCULAR | Status: AC
  Filled 2016-08-27: qty 2

## 2016-08-27 MED ORDER — ONDANSETRON HCL 4 MG/2ML IJ SOLN: 4.0000 mg | Freq: Four times a day (QID) | INTRAMUSCULAR | Status: DC | PRN

## 2016-08-27 MED ORDER — MIDAZOLAM HCL 2 MG/2ML IJ SOLN
INTRAMUSCULAR | Status: DC | PRN
  Administered 2016-08-27: 1 mg via INTRAVENOUS

## 2016-08-27 MED ORDER — CITALOPRAM HYDROBROMIDE 20 MG PO TABS
10.0000 mg | ORAL_TABLET | Freq: Every day | ORAL | Status: DC
  Administered 2016-08-27: 10 mg via ORAL
  Filled 2016-08-27: qty 1

## 2016-08-27 MED ORDER — L-METHYLFOLATE-B6-B12 3-35-2 MG PO TABS
1.0000 | ORAL_TABLET | Freq: Two times a day (BID) | ORAL | Status: DC
  Administered 2016-08-27: 1 via ORAL
  Filled 2016-08-27 (×2): qty 1

## 2016-08-27 MED ORDER — LORATADINE 10 MG PO TABS
10.0000 mg | ORAL_TABLET | Freq: Every day | ORAL | Status: DC
  Administered 2016-08-27: 10 mg via ORAL
  Filled 2016-08-27: qty 1

## 2016-08-27 MED ORDER — CILOSTAZOL 100 MG PO TABS
100.0000 mg | ORAL_TABLET | Freq: Two times a day (BID) | ORAL | Status: DC
  Administered 2016-08-27: 100 mg via ORAL
  Filled 2016-08-27 (×2): qty 1

## 2016-08-27 MED ORDER — ASPIRIN EC 81 MG PO TBEC: 81.0000 mg | DELAYED_RELEASE_TABLET | Freq: Every day | ORAL | Status: DC

## 2016-08-27 MED ORDER — ROSUVASTATIN CALCIUM 10 MG PO TABS: 20.0000 mg | ORAL_TABLET | Freq: Every day | ORAL | Status: DC

## 2016-08-27 MED ORDER — LIDOCAINE HCL (PF) 1 % IJ SOLN
INTRAMUSCULAR | Status: AC
  Filled 2016-08-27: qty 30

## 2016-08-27 MED ORDER — MORPHINE SULFATE (PF) 2 MG/ML IV SOLN: 2.0000 mg | INTRAVENOUS | Status: DC | PRN

## 2016-08-27 MED ORDER — SODIUM CHLORIDE 0.9 % IV SOLN: INTRAVENOUS | Status: AC

## 2016-08-27 MED ORDER — HYDRALAZINE HCL 20 MG/ML IJ SOLN: 10.0000 mg | INTRAMUSCULAR | Status: DC | PRN

## 2016-08-27 MED ORDER — PANTOPRAZOLE SODIUM 20 MG PO TBEC
20.0000 mg | DELAYED_RELEASE_TABLET | Freq: Every day | ORAL | Status: DC
  Administered 2016-08-27: 20 mg via ORAL
  Filled 2016-08-27: qty 1

## 2016-08-27 MED ORDER — FENTANYL CITRATE (PF) 100 MCG/2ML IJ SOLN
INTRAMUSCULAR | Status: AC
  Filled 2016-08-27: qty 2

## 2016-08-27 MED ORDER — PRAZOSIN HCL 2 MG PO CAPS: 3.0000 mg | ORAL_CAPSULE | Freq: Every day | ORAL | Status: DC

## 2016-08-27 MED ORDER — METOPROLOL TARTRATE 25 MG PO TABS
25.0000 mg | ORAL_TABLET | Freq: Two times a day (BID) | ORAL | Status: DC
  Administered 2016-08-27: 25 mg via ORAL
  Filled 2016-08-27: qty 1

## 2016-08-27 SURGICAL SUPPLY — 11 items
CATH ANGIO 5F PIGTAIL 65CM (CATHETERS) ×2 IMPLANT
CATH CROSS OVER TEMPO 5F (CATHETERS) ×2 IMPLANT
CATH STRAIGHT 5FR 65CM (CATHETERS) ×2 IMPLANT
HOVERMATT SINGLE USE (MISCELLANEOUS) ×2 IMPLANT
KIT PV (KITS) ×2 IMPLANT
NEEDLE SMART REG 18GX2-3/4 (NEEDLE) ×2 IMPLANT
SHEATH PINNACLE 5F 10CM (SHEATH) ×2 IMPLANT
SYRINGE MEDRAD AVANTA MACH 7 (SYRINGE) ×2 IMPLANT
TRANSDUCER W/STOPCOCK (MISCELLANEOUS) ×2 IMPLANT
TRAY PV CATH (CUSTOM PROCEDURE TRAY) ×2 IMPLANT
WIRE HITORQ VERSACORE ST 145CM (WIRE) ×2 IMPLANT

## 2016-08-27 NOTE — Progress Notes (Signed)
L. Dorene Ar, NP present to assess pt cath site.  Received instructions to have pt remain in bed x 1hr, then repeat ambulation and call Dr. Oval Linsey to get final say for pt discharge.

## 2016-08-27 NOTE — Progress Notes (Signed)
Pt 4hr bedrest up, upon ambulating to bathroom, noted small amount of blood on pad that pt had been lying on.  Dressing still clean and dry, no swelling noted.  Extremity warm to touch and pulses palpable.  Did notice blood has seemed to pool in fold of pt lower abdomen/groin area that probably trickled between legs while resting.  Does not seem to be actively bleeding.  Cecilie Kicks, NP paged and notified.

## 2016-08-27 NOTE — Progress Notes (Signed)
Left femoral sheath pulled and pressure held for 20 minutes. Groin level 0, left pedal pulse palpable. Patient given instructions and verbalizes understanding of these instructions. Down time starts at 11:00 for 4 hours. No apparent complications.

## 2016-08-27 NOTE — Interval H&P Note (Signed)
History and Physical Interval Note:  08/27/2016 9:40 AM  Joel Rivera.  has presented today for surgery, with the diagnosis of claudication  The various methods of treatment have been discussed with the patient and family. After consideration of risks, benefits and other options for treatment, the patient has consented to  Procedure(s): Lower Extremity Angiography (N/A) as a surgical intervention .  The patient's history has been reviewed, patient examined, no change in status, stable for surgery.  I have reviewed the patient's chart and labs.  Questions were answered to the patient's satisfaction.     [name]

## 2016-08-27 NOTE — Progress Notes (Signed)
Patient Name: Joel Rivera. Date of Encounter: 08/27/2016  Primary Cardiologist: Dr. Tamala Julian (Dr. Gwenlyn Found [facility] Problem List     Principal Problem:   Claudication in peripheral vascular disease [facility]) Active Problems:   Coronary artery disease involving native heart   Hypertensive heart disease   Peripheral arterial disease ([place])   PAD (peripheral artery disease) ([place])     Subjective   Feeling well.  Denies chest pain or shortness of breath.   Inpatient Medications    Scheduled Meds: . [START ON 08/28/2016] aspirin EC  81 mg Oral Daily  . [START ON 08/28/2016] canagliflozin  100 mg Oral QAC breakfast  . chlorthalidone  25 mg Oral QODAY  . cholecalciferol  1,000 Units Oral Daily  . cilostazol  100 mg Oral BID  . citalopram  10 mg Oral Daily  . insulin aspart  0-5 Units Subcutaneous QHS  . insulin aspart  0-9 Units Subcutaneous TID WC  . insulin glargine  20 Units Subcutaneous QHS  . l-methylfolate-B6-B12  1 tablet Oral BID  . loratadine  10 mg Oral Daily  . losartan  50 mg Oral Daily  . metoprolol tartrate  25 mg Oral BID  . montelukast  10 mg Oral QHS  . NIFEdipine  30 mg Oral Daily  . pantoprazole  20 mg Oral Daily  . prazosin  3 mg Oral QHS  . rosuvastatin  40 mg Oral Daily   Continuous Infusions: . sodium chloride 125 mL/hr at 08/27/16 1251  . sodium chloride     PRN Meds: acetaminophen, hydrALAZINE, morphine injection, ondansetron (ZOFRAN) IV, sodium chloride   Vital Signs    Vitals:   08/27/16 1129 08/27/16 1145 08/27/16 1159 08/27/16 1230  BP: (!) 158/66 (!) 146/70  (!) 152/67  Pulse: (!) 55 (!) 58 (!) 55 (!) 58  Resp: 18 18 18 17   Temp:      TempSrc:      SpO2: 97% 98% 98% 100%  Weight:      Height:        Intake/Output Summary (Last 24 hours) at 08/27/16 1340 Last data filed at 08/27/16 0750  Gross per 24 hour  Intake          1197.24 ml  Output              350 ml  Net           847.24 ml   Filed Weights   08/26/16 1331    Weight: 278 lb 9.6 oz (126.4 kg)    Physical Exam    GEN: Well nourished, well developed, in no acute distress.  HEENT: Grossly normal.  Neck: Supple, no JVD, carotid bruits, or masses. Cardiac: RRR, no murmurs, rubs, or gallops. No clubbing, cyanosis, edema.  Radials/DP/PT 2+ and equal bilaterally.  L groin without bruit or hematoma Respiratory:  Respirations regular and unlabored, clear to auscultation bilaterally. GI: Soft, nontender, nondistended, BS + x 4. MS: no deformity or atrophy. Skin: warm and dry, no rash. Neuro:  Strength and sensation are intact. Psych: AAOx3.  Normal affect.  Labs    CBC  Recent Labs  08/26/16 1638  WBC 8.9  HGB 13.4  HCT 40.8  MCV 89.1  PLT [id number]   Basic Metabolic Panel  Recent Labs  08/26/16 1638 08/27/16 0422  NA 139 138  K 3.8 4.3  CL 108 108  CO2 24 21*  GLUCOSE 164* 111*  BUN 17 17  CREATININE 1.43* 1.21  CALCIUM  9.1 8.9   Liver Function Tests No results for input(s): AST, ALT, ALKPHOS, BILITOT, PROT, ALBUMIN in the last 72 hours. No results for input(s): LIPASE, AMYLASE in the last 72 hours. Cardiac Enzymes No results for input(s): CKTOTAL, CKMB, CKMBINDEX, TROPONINI in the last 72 hours. BNP Invalid input(s): POCBNP D-Dimer No results for input(s): DDIMER in the last 72 hours. Hemoglobin A1C No results for input(s): HGBA1C in the last 72 hours. Fasting Lipid Panel No results for input(s): CHOL, HDL, LDLCALC, TRIG, CHOLHDL, LDLDIRECT in the last 72 hours. Thyroid Function Tests No results for input(s): TSH, T4TOTAL, T3FREE, THYROIDAB in the last 72 hours.  Invalid input(s): FREET3  Telemetry    Sinus bradycardia.  Mobitz II. - Personally Reviewed  ECG    08/26/16: Sinus bradycardia.  Rate 59 bpm. LAD.  Lateral TWI.  - Personally Reviewed  Radiology    No results found.  Cardiac Studies   08/27/16: 1: Abdominal aortogram-the distal dominant aorta was free of significant disease 2: Left lower  extremity-there is a 95% segmental distal left SFA stenosis that was highly calcified in the adductor canal with three-vessel runoff 3: Right lower extremity-there was a 90% calcified eccentric exophytic plaque in the right common femoral artery. There was 80% segmental calcified stenosis in the distal right SFA in the adductor canal. There was diffuse 60% P3 stenosis just before the takeoff of the anterior tibial with three-vessel runoff   Patient Profile     Mr. Schlender is a 89M with CAD s/p PCI, hypertension, diabetes, hyperlipidemia and tobacco abuse here for pre-hydration and abdominal aortogram with run-off.  Found to have significant PAD bilaterally and will return for staged stenting.   Assessment & Plan    # PAD: Mr. Caldarelli was found to have 80-95% blockages in bilateral SFA.  Given his chronic renal failure he was admitted for hydration and will follow up with Dr. Gwenlyn Found as an outpatient prior to having the stenting procedure.  He   # Hypertension: Blood pressure is poorly-controlled, but he did not get several of his medications, including losartan and metoprolol prior to cath.  Therefore, we will not make any changes at this time.  # Hyperlipidemia: Continue rosuvastatin.  No recent lipids are on file.  This should be checked as an outpatient.  If not <70 and he cannot tolerate higher doses, consider PCSK9 inhibitor.  # Mobitz II: Mr. Guglielmo was noted to have pauses 2/2 Mobitz 2 on telemetry.  He is completely asymptomatic.  Continue metoprolol given history of CAD.  # CKD: Recommend that he have a consultation with nephrology as an outpatient.  We discussed the importance of diabetes and BP control as well as limiting sodium intake.  He   Signed, Skeet Latch, MD  08/27/2016, 1:40 PM

## 2016-08-27 NOTE — Discharge Summary (Signed)
Physician Discharge Summary       Patient ID: Joel Rivera. MRN: [id number] DOB/AGE: [birth year] 70 y.o.  Admit date: 08/26/2016 Discharge date: 08/27/2016 Primary Cardiologist:Dr. Tamala Julian  PV : Dr. Gwenlyn Found  Discharge Diagnoses:  Principal Problem:   Claudication in peripheral vascular disease [facility]) Active Problems:   Coronary artery disease involving native heart   Hypertensive heart disease   Peripheral arterial disease ([place])   PAD (peripheral artery disease) ([place])   Discharged Condition: good  Procedures: PV Angiogram by Dr. Gwenlyn Found 08/27/16 Pre Procedure Diagnosis: Peripheral arterial disease  Post Procedure Diagnosis: Peripheral arterial disease  Operators: Dr. [name]  Procedures Performed:            1. Abdominal aortogram            2. Bilateral iliac angiogram            3. Right lower extremity antegrade with runoff (second order catheter placement)            4. Left lower extremity angiography with runoff.  PROCEDURE DESCRIPTION:   The patient was brought to the second floor  Cardiac cath lab in the the postabsorptive state. He was premedicated with Valium 5 mg by mouth, IV Versed and fentanyl. His left groin was prepped and shaved in usual sterile fashion. Xylocaine 1% was used for local anesthesia. A 5 French sheath was inserted into the left common femoral  artery using standard Seldinger technique. A 5 French pigtail catheter was placed in the distal abdominal aorta. Distal abdominal aortography and bilateral iliac angiography were performed. Contralateral access was obtained with a crossover catheter and ental catheter (second order catheter placement). Right lower extremity angiography runoff was used using bolus chase, digital subtraction and step table technique. Left lower extremity angiography was performed via the SideArm sheath. Omnipaque dye was used for the entirety of the case. Retrograde aortic pressure was monitored during the  case.   Angiographic Data:   1: Abdominal aortogram-the distal dominant aorta was free of significant disease 2: Left lower extremity-there is a 95% segmental distal left SFA stenosis that was highly calcified in the adductor canal with three-vessel runoff 3: Right lower extremity-there was a 90% calcified eccentric exophytic plaque in the right common femoral artery. There was 80% segmental calcified stenosis in the distal right SFA in the adductor canal. There was diffuse 60% P3 stenosis just before the takeoff of the anterior tibial with three-vessel runoff   Final Impression: Mr. Estabrook was brought in for hydration and angiography because of claudication. He has a high-grade calcified right common femoral artery stenosis as well as bilateral SFA stenoses in the adductor canal that are highly calcified and will require diamondback orbital rotational atherectomy. Because of inadequate contrast used and his beginning serum creatinine with known renal insufficiency was decided to stage his intervention. The sheath will be removed and pressure held. Patient will be hydrated and discharged home today. I will see him back in the [facility]  office next week. He left the lab in stable condition.   Hospital Course: 70 year old severely overweight married African-American male father of 35, grandfather 3 grandchildren referred to me by Dr. Pernell Dupre for peripheral vascular evaluation. He has history of CAD status post chronic catheterization in 2003. He has a remote history of tobacco abuse, treated hypertension, hypokalemia and diabetes. Lifestyle limiting claudication left greater than right for last 3 years. Dopplers performed 03/29/15 revealed a right ABI 0.79 with a high-frequency signal in the distal  right SFA and left ABI of 0.6 with an occluded distal left SFA.  With CKD-3 pt was brought in for IV fluids the night before procedure.  On arrival 1.43 and at discharge 1.21.  PV procedure completed  without complications.  He will follow up with Dr. Adora Fridge on the 18th and plan for staged procedure of PV PCI.    May wish to consider BMP on follow up visit.  Will resume ARB.  Glucose has been lower while NPO.    Pt ambulated and if no problems but did have oozing from site that ran past dressing.  Has been back in bed - will have pt ambulate again in 1 hour if no bleeding will be discharged.  RN will call Dr. Oval Linsey to clear after second walk.  Consults: None  Significant Diagnostic Studies:  BMP Latest Ref Rng & Units 08/27/2016 08/26/2016 08/21/2016  Glucose 65 - 99 mg/dL 111(H) 164(H) 107(H)  BUN 6 - 20 mg/dL 17 17 19   Creatinine 0.61 - 1.24 mg/dL 1.21 1.43(H) 1.61(H)  Sodium 135 - 145 mmol/L 138 139 140  Potassium 3.5 - 5.1 mmol/L 4.3 3.8 4.0  Chloride 101 - 111 mmol/L 108 108 103  CO2 22 - 32 mmol/L 21(L) 24 27  Calcium 8.9 - 10.3 mg/dL 8.9 9.1 9.3   CBC Latest Ref Rng & Units 08/26/2016 08/21/2016  WBC 4.0 - 10.5 K/uL 8.9 7.8  Hemoglobin 13.0 - 17.0 g/dL 13.4 14.1  Hematocrit 39.0 - 52.0 % 40.8 43.2  Platelets 150 - 400 K/uL 168 173     Discharge Exam: Blood pressure (!) 152/67, pulse (!) 58, temperature 98.3 F (36.8 C), temperature source Oral, resp. rate 17, height 5\' 8"  (1.727 m), weight 278 lb 9.6 oz (126.4 kg), SpO2 100 %. Per Dr. Oval Linsey GEN: Well nourished, well developed, in no acute distress.  HEENT: Grossly normal.  Neck: Supple, no JVD, carotid bruits, or masses. Cardiac: RRR, no murmurs, rubs, or gallops. No clubbing, cyanosis, edema.  Radials/DP/PT 2+ and equal bilaterally.  L groin without bruit or hematoma Respiratory:  Respirations regular and unlabored, clear to auscultation bilaterally. GI: Soft, nontender, nondistended, BS + x 4. MS: no deformity or atrophy. Skin: warm and dry, no rash. Neuro:  Strength and sensation are intact. Psych: AAOx3.  Normal affect.  Re-look at site without bleeding.    Disposition: home    Medication List      TAKE these medications   acetaminophen 325 MG tablet Commonly known as:  TYLENOL Take 650 mg by mouth every 6 (six) hours as needed (for pain.).   aspirin EC 81 MG tablet Take 81 mg by mouth daily.   chlorthalidone 50 MG tablet Commonly known as:  HYGROTON Take 25 mg by mouth every other day.   cholecalciferol 1000 units tablet Commonly known as:  VITAMIN D Take 1,000 Units by mouth daily.   cilostazol 100 MG tablet Commonly known as:  PLETAL TAKE 1 TABLET BY MOUTH TWICE DAILY   citalopram 20 MG tablet Commonly known as:  CELEXA Take 10 mg by mouth daily.   HUMALOG KWIKPEN 100 UNIT/ML KiwkPen Generic drug:  insulin lispro Inject 7-11 Units into the skin 2 (two) times daily. 7 units in the morning before breakfast, 11 units at lunch   insulin glargine 100 UNIT/ML injection Commonly known as:  LANTUS Inject 49 Units into the skin at bedtime.   JARDIANCE 25 MG Tabs tablet Generic drug:  empagliflozin Take 25 mg by mouth daily.  l-methylfolate-B6-B12 3-35-2 MG Tabs tablet Commonly known as:  METANX Take 1 tablet by mouth 2 (two) times daily.   losartan 50 MG tablet Commonly known as:  COZAAR TAKE 1 TABLET(50 MG) BY MOUTH DAILY   metoprolol tartrate 25 MG tablet Commonly known as:  LOPRESSOR Take 25 mg by mouth 2 (two) times daily.   montelukast 10 MG tablet Commonly known as:  SINGULAIR Take 10 mg by mouth at bedtime.   NIFEdipine 30 MG 24 hr tablet Commonly known as:  PROCARDIA-XL/ADALAT CC Take 30 mg by mouth daily.   pantoprazole 20 MG tablet Commonly known as:  PROTONIX Take 20 mg by mouth daily.   prazosin 1 MG capsule Commonly known as:  MINIPRESS Take 3 mg by mouth at bedtime.   rosuvastatin 40 MG tablet Commonly known as:  CRESTOR Take 40 mg by mouth daily.   sodium chloride 0.65 % Soln nasal spray Commonly known as:  OCEAN Place 1 spray into both nostrils 3 (three) times daily as needed for congestion.   ZYRTEC ALLERGY 10 MG  tablet Generic drug:  cetirizine Take 10 mg by mouth daily.      Follow-up Information    [name], MD Follow up on 08/29/2016.   Specialties:  Cardiology, Radiology Why:  at 4:00PM Contact information: [address] [place] [address] [phone]            Discharge Instructions: Call [place] at [phone] if any bleeding, swelling or drainage at cath site.  May shower, no tub baths for 48 hours for groin sticks. No lifting over 5 pounds for 3 days.  No Driving for 3 days.    Heart Healthy Diabetic Diet  Monitor your glucose closely - it may run low after holding food for cath.  Keep follow up appt with Dr. Adora Fridge.     Signed: Cecilie Kicks Nurse Practitioner-Certified [facility]: [facility] 08/27/2016, 1:42 PM  Time spent on discharge :30 minutes.

## 2016-08-27 NOTE — Progress Notes (Signed)
Pt is a direct admit from home, no home med order available. Dr. Radford Pax notified. New order received for Metoprolol and Crestor for patient night medication.

## 2016-08-27 NOTE — Discharge Instructions (Signed)
Call [place] at [phone] if any bleeding, swelling or drainage at cath site.  May shower, no tub baths for 48 hours for groin sticks. No lifting over 5 pounds for 3 days.  No Driving for 3 days.    Heart Healthy Diabetic Diet  Monitor your glucose closely - it may run low after holding food for cath.  Keep follow up appt with Dr. Adora Fridge.

## 2016-08-27 NOTE — H&P (View-Only) (Signed)
08/07/2016 Joel Rivera.   1946/11/05  [id number]  Primary Physician Jani Gravel, MD Primary Cardiologist: Lorretta Harp MD Renae Gloss  HPI:  Joel Rivera is a very pleasant 70 year old severely overweight married African-American male father of 67, grandfather 3 grandchildren referred to me by Dr. Pernell Dupre for peripheral vascular evaluation. He has history of CAD status post chronic catheterization in 2003. He has a remote history of tobacco abuse, treated hypertension, hypokalemia and diabetes. Lifestyle limiting claudication left greater than right for last 3 years. Dopplers performed 03/29/15 revealed a right ABI 0.79 with a high-frequency signal in the distal right SFA and left ABI of 0.6 with an occluded distal left SFA   Current Outpatient Prescriptions  Medication Sig Dispense Refill  . aspirin 81 MG tablet Take 81 mg by mouth daily.    Marland Kitchen azelastine (ASTELIN) 0.1 % nasal spray Place 2 sprays into both nostrils 2 (two) times daily. Use in each nostril as directed    . chlorthalidone (HYGROTON) 50 MG tablet Take 25 mg by mouth daily.    . cholecalciferol (VITAMIN D) 1000 UNITS tablet Take 1,000 Units by mouth daily.    . cilostazol (PLETAL) 100 MG tablet TAKE 1 TABLET BY MOUTH TWICE DAILY 60 tablet 5  . citalopram (CELEXA) 20 MG tablet Take 20 mg by mouth daily.    Marland Kitchen HUMALOG KWIKPEN 100 UNIT/ML KiwkPen Inject five (5) units into the skin every morning. Inject seven (7) units into the skin around mid-day/ lunch.    . insulin glargine (LANTUS) 100 UNIT/ML injection Inject 45 Units into the skin at bedtime.    Marland Kitchen JARDIANCE 25 MG TABS tablet Take 1 tablet by mouth daily.    Marland Kitchen l-methylfolate-B6-B12 (METANX) 3-35-2 MG TABS Take 1 tablet by mouth 2 (two) times daily.     Marland Kitchen loratadine (CLARITIN) 10 MG tablet Take 10 mg by mouth daily.    Marland Kitchen losartan (COZAAR) 50 MG tablet TAKE 1 TABLET(50 MG) BY MOUTH DAILY 90 tablet 2  . NIFEdipine (PROCARDIA XL/ADALAT-CC) 60 MG 24 hr tablet  Take 30 mg by mouth daily.     . pantoprazole (PROTONIX) 20 MG tablet Take 20 mg by mouth daily.     . rosuvastatin (CRESTOR) 20 MG tablet Take 20 mg by mouth daily.     No current facility-administered medications for this visit.     No Known Allergies  Social History   Social History  . Marital status: Married    Spouse name: N/A  . Number of children: N/A  . Years of education: N/A   Occupational History  . Not on file.   Social History Main Topics  . Smoking status: Former Smoker    Quit date: 11/01/1988  . Smokeless tobacco: Never Used  . Alcohol use No  . Drug use: No  . Sexual activity: Yes    Birth control/ protection: Condom   Other Topics Concern  . Not on file   Social History Narrative   No caffeine.     Review of Systems: General: negative for chills, fever, night sweats or weight changes.  Cardiovascular: negative for chest pain, dyspnea on exertion, edema, orthopnea, palpitations, paroxysmal nocturnal dyspnea or shortness of breath Dermatological: negative for rash Respiratory: negative for cough or wheezing Urologic: negative for hematuria Abdominal: negative for nausea, vomiting, diarrhea, bright red blood per rectum, melena, or hematemesis Neurologic: negative for visual changes, syncope, or dizziness All other systems reviewed and are otherwise negative except  as noted above.    Blood pressure 132/72, height 5\' 8"  (1.727 m), weight 282 lb (127.9 kg).  General appearance: alert and no distress Neck: no adenopathy, no JVD, supple, symmetrical, trachea midline, thyroid not enlarged, symmetric, no tenderness/mass/nodules and Soft right carotid bruit Lungs: clear to auscultation bilaterally Heart: regular rate and rhythm, S1, S2 normal, no murmur, click, rub or gallop Extremities: extremities normal, atraumatic, no cyanosis or edema  EKG not performed today  ASSESSMENT AND PLAN:   Peripheral arterial disease [facility]) Joel Rivera was referred to me  by Dr. Tamala Julian for evaluation of PAD. He's had left greater than right lower extremity lifestyle limiting claudication for the last 2-3 years. He does have positive risk factors. Dopplers performed 03/29/15 revealed a right ABI 0.79 with a moderately elevated velocity signal in the distal right SFA and a left ABI 0.6 with an occluded distal left SFA. He says that his legs prevented him from doing his normal activities such as mowing his lawn. He does have stage III's CKD we will need to follow his serum creatinine. I'm going to arrange for him undergo repeat Dopplers followed by angiography and potential intervention.      Lorretta Harp MD FACP,FACC,FAHA, [facility] 08/07/2016 3:12 PM

## 2016-08-27 NOTE — Progress Notes (Signed)
Cath site clean and dry, no bleeding noted.  Pt ambulated in hall without difficulty.  Paged Dr. Oval Linsey to get final discharge clearance.

## 2016-08-29 ENCOUNTER — Encounter: Payer: Self-pay | Admitting: Cardiovascular Disease

## 2016-08-29 ENCOUNTER — Other Ambulatory Visit: Payer: Self-pay | Admitting: *Deleted

## 2016-08-29 ENCOUNTER — Ambulatory Visit (INDEPENDENT_AMBULATORY_CARE_PROVIDER_SITE_OTHER): Payer: Medicare Other | Admitting: Cardiovascular Disease

## 2016-08-29 VITALS — BP 142/66 | HR 88 | Ht 68.0 in | Wt 280.6 lb

## 2016-08-29 DIAGNOSIS — Z01810 Encounter for preprocedural cardiovascular examination: Secondary | ICD-10-CM

## 2016-08-29 DIAGNOSIS — Z01818 Encounter for other preprocedural examination: Secondary | ICD-10-CM | POA: Diagnosis not present

## 2016-08-29 DIAGNOSIS — Z7902 Long term (current) use of antithrombotics/antiplatelets: Secondary | ICD-10-CM | POA: Diagnosis not present

## 2016-08-29 DIAGNOSIS — I739 Peripheral vascular disease, unspecified: Secondary | ICD-10-CM | POA: Diagnosis not present

## 2016-08-29 NOTE — Assessment & Plan Note (Signed)
Mr. Kearney returns today for post hospital follow-up after his recent angiogram performed 08/26/16 revealed a high-grade calcified right common femoral artery stenosis, bilateral segmental calcified SFA stenoses with three-vessel runoff. He was sent home the same day without intervention because of concerns regarding renal insufficiency and radiocontrast nephropathy. He wishes to proceed with vascularization. He will need diamondback orbital rotational atherectomy as well as drug eluting balloon angioplasty. Plan will be to fix the right side initially and states his left SFA.

## 2016-08-29 NOTE — Patient Instructions (Addendum)
Medication Instructions:  Your physician has recommended you make the following change in your medication:  Spoke with Dr Gwenlyn Found and he advised it was ok for you to [place] at this time.   PRIOR TO PROCEDURE: 1- The night before procedure, take 1/2 your dose of Lantus. 2- The day of procedure, do not take insulin.   Testing/Procedures: Dr. Gwenlyn Found has ordered a peripheral angiogram WITH DIAMONDBACK AND PTA INTERVENTION to be done at [facility].  This procedure is going to look at the bloodflow in your lower extremities.  If Dr. Gwenlyn Found is able to open up the arteries, you will have to spend one night in the hospital.  If he is not able to open the arteries, you will be able to go home that same day.    After the procedure, you will not be allowed to drive for 3 days or push, pull, or lift anything greater than 10 lbs for one week.    You will be required to have the following tests prior to the procedure:  1. Blood work-the blood work can be done no more than 14 days prior to the procedure.  It can be done at any [facility] lab.  There is one downstairs on the first floor of this building and one in the [facility] building [phone])     *REPS  ERIC  Puncture site LEFT GROIN  If you need a refill on your cardiac medications before your next appointment, please call your pharmacy.

## 2016-08-29 NOTE — Progress Notes (Signed)
08/29/2016 Joel Rivera.   1946/03/08  [id number]  Primary Physician Jani Gravel, MD Primary Cardiologist: Lorretta Harp MD Renae Gloss  HPI:  Joel Rivera is a very pleasant 70 year old severely overweight married African-American male father of 68, grandfather 3 grandchildren referred to me by Dr. Pernell Dupre for peripheral vascular evaluation. I last saw him in the office 08/07/16. He has history of CAD status post chronic catheterization in 2003. He has a remote history of tobacco abuse, treated hypertension, hypokalemia and diabetes. Lifestyle limiting claudication left greater than right for last 3 years. Dopplers performed 03/29/15 revealed a right ABI 0.79 with a high-frequency signal in the distal right SFA and left ABI of 0.6 with an occluded distal left SFA because of a serum creatinine 1.6 the patient was admitted for hydration the day prior to angiography. His serum creatinine fell from 1.6-1.2. 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 He wishes to proceed with staged revascularization of his right leg followed by his left leg in order to improve his walking ability.   Current Outpatient Prescriptions  Medication Sig Dispense Refill  . acetaminophen (TYLENOL) 325 MG tablet Take 650 mg by mouth every 6 (six) hours as needed (for pain.).    Marland Kitchen aspirin EC 81 MG tablet Take 81 mg by mouth daily.    . cetirizine (ZYRTEC ALLERGY) 10 MG tablet Take 10 mg by mouth daily.    . chlorthalidone (HYGROTON) 50 MG tablet Take 25 mg by mouth every other day.    . cholecalciferol (VITAMIN D) 1000 UNITS tablet Take 1,000 Units by mouth daily.    . cilostazol (PLETAL) 100 MG tablet TAKE 1 TABLET BY MOUTH TWICE DAILY 60 tablet 5  . citalopram (CELEXA) 20 MG tablet Take 10 mg by mouth daily.    Marland Kitchen HUMALOG KWIKPEN 100 UNIT/ML KiwkPen Inject 7-11 Units into  the skin 2 (two) times daily. 7 units in the morning before breakfast, 11 units at lunch    . insulin glargine (LANTUS) 100 UNIT/ML injection Inject 49 Units into the skin at bedtime.     Marland Kitchen JARDIANCE 25 MG TABS tablet Take 25 mg by mouth daily.     Marland Kitchen l-methylfolate-B6-B12 (METANX) 3-35-2 MG TABS Take 1 tablet by mouth 2 (two) times daily.     Marland Kitchen losartan (COZAAR) 50 MG tablet TAKE 1 TABLET(50 MG) BY MOUTH DAILY 90 tablet 2  . metoprolol tartrate (LOPRESSOR) 25 MG tablet Take 25 mg by mouth 2 (two) times daily.    . montelukast (SINGULAIR) 10 MG tablet Take 10 mg by mouth at bedtime.    Marland Kitchen NIFEdipine (PROCARDIA-XL/ADALAT CC) 30 MG 24 hr tablet Take 30 mg by mouth daily.    . pantoprazole (PROTONIX) 20 MG tablet Take 20 mg by mouth daily.     . prazosin (MINIPRESS) 1 MG capsule Take 3 mg by mouth at bedtime.    . rosuvastatin (CRESTOR) 40 MG tablet Take 40 mg by mouth daily.    . sodium chloride (OCEAN) 0.65 % SOLN nasal spray Place 1 spray into both nostrils 3 (three) times daily as needed for congestion.     No current facility-administered medications for this visit.     Allergies  Allergen Reactions  . Atorvastatin Other (See Comments)    Muscle pain/weaknesses (Myalgia)  . Pravastatin Other (See Comments)    Muscle pain/weaknesses (Myalgia)  . Simvastatin  Other (See Comments)    Muscle pain/weaknesses (Myalgia)  . Terazosin Other (See Comments)    Unknown reaction.    Social History   Social History  . Marital status: Married    Spouse name: N/A  . Number of children: N/A  . Years of education: N/A   Occupational History  . Not on file.   Social History Main Topics  . Smoking status: Former Smoker    Quit date: 11/01/1988  . Smokeless tobacco: Never Used  . Alcohol use No  . Drug use: No  . Sexual activity: Yes    Birth control/ protection: Condom   Other Topics Concern  . Not on file   Social History Narrative   No caffeine.     Review of Systems: General:  negative for chills, fever, night sweats or weight changes.  Cardiovascular: negative for chest pain, dyspnea on exertion, edema, orthopnea, palpitations, paroxysmal nocturnal dyspnea or shortness of breath Dermatological: negative for rash Respiratory: negative for cough or wheezing Urologic: negative for hematuria Abdominal: negative for nausea, vomiting, diarrhea, bright red blood per rectum, melena, or hematemesis Neurologic: negative for visual changes, syncope, or dizziness All other systems reviewed and are otherwise negative except as noted above.    Blood pressure (!) 142/66, pulse 88, height 5\' 8"  (1.727 m), weight 280 lb 9.6 oz (127.3 kg).  General appearance: alert and no distress Neck: no adenopathy, no carotid bruit, no JVD, supple, symmetrical, trachea midline and thyroid not enlarged, symmetric, no tenderness/mass/nodules Lungs: clear to auscultation bilaterally Heart: regular rate and rhythm, S1, S2 normal, no murmur, click, rub or gallop Extremities: extremities normal, atraumatic, no cyanosis or edema  EKG not performed today  ASSESSMENT AND PLAN:   Claudication [facility]) Joel Rivera returns today for post hospital follow-up after his recent angiogram performed 08/26/16 revealed a high-grade calcified right common femoral artery stenosis, bilateral segmental calcified SFA stenoses with three-vessel runoff. He was sent home the same day without intervention because of concerns regarding renal insufficiency and radiocontrast nephropathy. He wishes to proceed with vascularization. He will need diamondback orbital rotational atherectomy as well as drug eluting balloon angioplasty. Plan will be to fix the right side initially and states his left SFA.      Lorretta Harp MD FACP,FACC,FAHA, [facility] 08/29/2016 4:35 PM

## 2016-08-30 ENCOUNTER — Telehealth: Payer: Self-pay | Admitting: Interventional Cardiology

## 2016-08-30 ENCOUNTER — Encounter: Payer: Self-pay | Admitting: Cardiovascular Disease

## 2016-08-30 NOTE — Telephone Encounter (Signed)
New message    Pt wife verbalized that she wants to speak to the rn she has some questions that she needs to ask her about pt procedure

## 2016-08-30 NOTE — Telephone Encounter (Signed)
Will forward to Dr. Kennon Holter nurse as pt was scheduled by Dr. Gwenlyn Found for Angiography.

## 2016-09-04 DIAGNOSIS — I10 Essential (primary) hypertension: Secondary | ICD-10-CM | POA: Diagnosis not present

## 2016-09-04 DIAGNOSIS — E1121 Type 2 diabetes mellitus with diabetic nephropathy: Secondary | ICD-10-CM | POA: Diagnosis not present

## 2016-09-04 DIAGNOSIS — N189 Chronic kidney disease, unspecified: Secondary | ICD-10-CM | POA: Diagnosis not present

## 2016-09-05 NOTE — Telephone Encounter (Signed)
Returned call and spoke with patient. He verbalized understanding to get lab work sometime next week. He had no further questions.

## 2016-09-07 DIAGNOSIS — E0841 Diabetes mellitus due to underlying condition with diabetic mononeuropathy: Secondary | ICD-10-CM | POA: Diagnosis not present

## 2016-09-07 DIAGNOSIS — I10 Essential (primary) hypertension: Secondary | ICD-10-CM | POA: Diagnosis not present

## 2016-09-07 DIAGNOSIS — I739 Peripheral vascular disease, unspecified: Secondary | ICD-10-CM | POA: Diagnosis not present

## 2016-09-07 DIAGNOSIS — Z09 Encounter for follow-up examination after completed treatment for conditions other than malignant neoplasm: Secondary | ICD-10-CM | POA: Diagnosis not present

## 2016-09-10 ENCOUNTER — Telehealth: Payer: Self-pay | Admitting: Cardiovascular Disease

## 2016-09-10 NOTE — Telephone Encounter (Signed)
Returned call to patient's wife, ok per DPR. She wanted to know what patient needed to do preoperatively. Let her know pt needed to get blood work sometime this week. She said she was actively being treated for breast cancer and would benefit from her husband staying the night in the hospital after the procedure as he also has PTSD. I advised that if patient has an intervention during the procedure then he would stay the night, otherwise, it is up to the medical opinion of Dr Gwenlyn Found.

## 2016-09-10 NOTE — Telephone Encounter (Signed)
New message      Pt is having a procedure nov 6th.  Calling to see if pt needs to have anything prior to admission.  Wife has breast cancer and is having radiation and needs to know exactly what pt is needing to have prior to appt for transportation purposes.  Please call

## 2016-09-11 DIAGNOSIS — I10 Essential (primary) hypertension: Secondary | ICD-10-CM | POA: Diagnosis not present

## 2016-09-11 DIAGNOSIS — E119 Type 2 diabetes mellitus without complications: Secondary | ICD-10-CM | POA: Diagnosis not present

## 2016-09-11 DIAGNOSIS — N183 Chronic kidney disease, stage 3 (moderate): Secondary | ICD-10-CM | POA: Diagnosis not present

## 2016-09-11 DIAGNOSIS — I739 Peripheral vascular disease, unspecified: Secondary | ICD-10-CM | POA: Diagnosis not present

## 2016-09-13 ENCOUNTER — Telehealth: Payer: Self-pay | Admitting: Cardiovascular Disease

## 2016-09-13 DIAGNOSIS — N183 Chronic kidney disease, stage 3 (moderate): Secondary | ICD-10-CM | POA: Diagnosis not present

## 2016-09-13 DIAGNOSIS — I2510 Atherosclerotic heart disease of native coronary artery without angina pectoris: Secondary | ICD-10-CM | POA: Diagnosis not present

## 2016-09-13 DIAGNOSIS — E119 Type 2 diabetes mellitus without complications: Secondary | ICD-10-CM | POA: Diagnosis not present

## 2016-09-13 DIAGNOSIS — E1342 Other specified diabetes mellitus with diabetic polyneuropathy: Secondary | ICD-10-CM | POA: Diagnosis not present

## 2016-09-13 NOTE — Telephone Encounter (Signed)
No answer. Left message to call back on patient's cell phone.

## 2016-09-13 NOTE — Telephone Encounter (Signed)
Called patient. He said he was at Dr Adin Hector office at [facility] on Tuesday and they needed labwork and so he had it done there and asked them to send [place] the results. Patient did not realize there may be different labs needed for Dr Gwenlyn Found.  Attempted to call [facility], was transferred to lab area, left message to call back to get lab information to see if the labs they had drawn were the same ones we needed. Otherwise patient will still need to have lab work at [facility] for Dr Gwenlyn Found.

## 2016-09-13 NOTE — Telephone Encounter (Signed)
Spoke with Joel Rivera again who said he went to [facility] at [address]. [phone]. She said he remembers them pulling up requisitions for Dr Gwenlyn Found and Dr Joelyn Oms. I still do not see the labs in EPIC. Unfortunately, [name] is closed already.  Will route to triage to f/u with [facility] first thing in the morning to make sure necessary labwork (CBC, BMP, Pt/INR, PTT) were drawn Tuesday and if so can they be uploaded into EPIC. If these labs were not obtained then patient will need to have labs.

## 2016-09-13 NOTE — Telephone Encounter (Signed)
Spoke with wife, ok per DPR. Seems to be some misunderstanding with patient, patient said he got lab work on Tuesday but it does not show in EPIC that labs have been drawn. [employer] and confirmed patient had not been there yet. Advised wife to have patient come today for labwork. Patient was in the shower and I could not talk to him directly.

## 2016-09-13 NOTE — Telephone Encounter (Signed)
She wants to know if pt will be admitted on Sunday and if his lab results are back?

## 2016-09-13 NOTE — Telephone Encounter (Signed)
F/u Message  Pt wife call requesting to speak with Rn. About result. Please call back to discuss

## 2016-09-14 NOTE — Telephone Encounter (Signed)
Spoke to [employer]. Labs were not under [place] ACCESSION/ACCOUNT  NUMBER.   RN callled patient a verified were he had labs drawn. Patient states he had labs drawn at [facility]- alom=ng with labs for Dr Pearson Grippe.  RN called Dr Adin Hector office. Spoke to [place] states she faxed result to office today. Received results placed to be scanned for review by Dr Gwenlyn Found.  patient notified and aware no more labs are needed.

## 2016-09-14 NOTE — Telephone Encounter (Signed)
SPOKE TO  WIFE INFORMED HER. INFORMATION WAS GIVEN TO PATEINT.

## 2016-09-14 NOTE — Telephone Encounter (Signed)
F/U  Joel Rivera is returning a call regarding Joel Rivera's lab results.

## 2016-09-14 NOTE — Telephone Encounter (Signed)
Lab results10/31/17  Bun 19  Creat 1.41  patient aware to hold losartan.  after reviewing labs form 09/11/16 - cbc , pt, ptt were not drawn. Spoke to wife - explain labs can be done tomorrow 09/15/16 at [address]  btwn 8-12 noon Will inform patient. If not can do labs the morning of the test.

## 2016-09-14 NOTE — Telephone Encounter (Signed)
Reviewed BMP Results from10/31/17 with Dr Percival Spanish ( D.O.D).  Last BMP labs, 08/22/16. Pateint is schedule L.E. ANGIOGRAM on 09/17/16 with Dr Gwenlyn Found. PER VERBAL , patient does not need to be prehydrated the night before , come in early on 11/6  for hydration prior to angiogram.  Patient aware to be at hospital at 5:30 am.  Spoke to [place] at cath lab scheduling aware and will note for short stay.

## 2016-09-15 DIAGNOSIS — Z7902 Long term (current) use of antithrombotics/antiplatelets: Secondary | ICD-10-CM | POA: Diagnosis not present

## 2016-09-15 DIAGNOSIS — Z01810 Encounter for preprocedural cardiovascular examination: Secondary | ICD-10-CM | POA: Diagnosis not present

## 2016-09-15 DIAGNOSIS — Z01818 Encounter for other preprocedural examination: Secondary | ICD-10-CM | POA: Diagnosis not present

## 2016-09-15 DIAGNOSIS — I739 Peripheral vascular disease, unspecified: Secondary | ICD-10-CM | POA: Diagnosis not present

## 2016-09-15 DIAGNOSIS — Z7901 Long term (current) use of anticoagulants: Secondary | ICD-10-CM | POA: Diagnosis not present

## 2016-09-15 LAB — CBC WITH DIFFERENTIAL/PLATELET
Basophils Absolute: 0 cells/uL (ref 0–200)
Basophils Relative: 0 %
Eosinophils Absolute: 69 cells/uL (ref 15–500)
Eosinophils Relative: 1 %
HCT: 40.6 % (ref 38.5–50.0)
Hemoglobin: 13.6 g/dL (ref 13.2–17.1)
Lymphocytes Relative: 35 %
Lymphs Abs: 2415 cells/uL (ref 850–3900)
MCH: 29.8 pg (ref 27.0–33.0)
MCHC: 33.5 g/dL (ref 32.0–36.0)
MCV: 88.8 fL (ref 80.0–100.0)
MPV: 10.5 fL (ref 7.5–12.5)
Monocytes Absolute: 828 cells/uL (ref 200–950)
Monocytes Relative: 12 %
Neutro Abs: 3588 cells/uL (ref 1500–7800)
Neutrophils Relative %: 52 %
Platelets: 170 10*3/uL (ref 140–400)
RBC: 4.57 MIL/uL (ref 4.20–5.80)
RDW: 13.2 % (ref 11.0–15.0)
WBC: 6.9 10*3/uL (ref 3.8–10.8)

## 2016-09-15 LAB — BASIC METABOLIC PANEL
BUN: 18 mg/dL (ref 7–25)
CO2: 25 mmol/L (ref 20–31)
Calcium: 8.8 mg/dL (ref 8.6–10.3)
Chloride: 106 mmol/L (ref 98–110)
Creat: 1.46 mg/dL — ABNORMAL HIGH (ref 0.70–1.18)
Glucose, Bld: 114 mg/dL — ABNORMAL HIGH (ref 65–99)
Potassium: 3.9 mmol/L (ref 3.5–5.3)
Sodium: 138 mmol/L (ref 135–146)

## 2016-09-16 LAB — PROTIME-INR
INR: 1
Prothrombin Time: 10.7 s (ref 9.0–11.5)

## 2016-09-16 LAB — APTT: aPTT: 28 s (ref 22–34)

## 2016-09-17 ENCOUNTER — Encounter (HOSPITAL_COMMUNITY)
Admission: RE | Disposition: A | Discharge status: Home / Self Care | Payer: Self-pay | Source: Ambulatory Visit | Attending: Cardiovascular Disease

## 2016-09-17 ENCOUNTER — Ambulatory Visit (HOSPITAL_COMMUNITY)
Admission: RE | Admit: 2016-09-17 | Discharge: 2016-09-18 | Disposition: A | Discharge status: Home / Self Care | Payer: Medicare Other | Source: Ambulatory Visit | Attending: Cardiovascular Disease | Admitting: Cardiovascular Disease

## 2016-09-17 ENCOUNTER — Encounter (HOSPITAL_COMMUNITY): Payer: Self-pay | Admitting: Cardiovascular Disease

## 2016-09-17 DIAGNOSIS — I10 Essential (primary) hypertension: Secondary | ICD-10-CM | POA: Diagnosis not present

## 2016-09-17 DIAGNOSIS — E876 Hypokalemia: Secondary | ICD-10-CM | POA: Insufficient documentation

## 2016-09-17 DIAGNOSIS — E1151 Type 2 diabetes mellitus with diabetic peripheral angiopathy without gangrene: Secondary | ICD-10-CM | POA: Insufficient documentation

## 2016-09-17 DIAGNOSIS — Z6841 Body Mass Index (BMI) 40.0 and over, adult: Secondary | ICD-10-CM | POA: Diagnosis not present

## 2016-09-17 DIAGNOSIS — Z7982 Long term (current) use of aspirin: Secondary | ICD-10-CM | POA: Insufficient documentation

## 2016-09-17 DIAGNOSIS — Z87891 Personal history of nicotine dependence: Secondary | ICD-10-CM | POA: Diagnosis not present

## 2016-09-17 DIAGNOSIS — I739 Peripheral vascular disease, unspecified: Secondary | ICD-10-CM | POA: Diagnosis present

## 2016-09-17 DIAGNOSIS — Z79899 Other long term (current) drug therapy: Secondary | ICD-10-CM | POA: Diagnosis not present

## 2016-09-17 DIAGNOSIS — E663 Overweight: Secondary | ICD-10-CM | POA: Insufficient documentation

## 2016-09-17 DIAGNOSIS — Z888 Allergy status to other drugs, medicaments and biological substances status: Secondary | ICD-10-CM | POA: Insufficient documentation

## 2016-09-17 DIAGNOSIS — I2510 Atherosclerotic heart disease of native coronary artery without angina pectoris: Secondary | ICD-10-CM | POA: Insufficient documentation

## 2016-09-17 DIAGNOSIS — I70211 Atherosclerosis of native arteries of extremities with intermittent claudication, right leg: Secondary | ICD-10-CM | POA: Diagnosis not present

## 2016-09-17 DIAGNOSIS — Z794 Long term (current) use of insulin: Secondary | ICD-10-CM | POA: Insufficient documentation

## 2016-09-17 HISTORY — DX: Type 2 diabetes mellitus without complications: E11.9

## 2016-09-17 HISTORY — PX: PERIPHERAL VASCULAR CATHETERIZATION: SHX172C

## 2016-09-17 HISTORY — DX: Gastro-esophageal reflux disease without esophagitis: K21.9

## 2016-09-17 LAB — URINALYSIS, ROUTINE W REFLEX MICROSCOPIC
Bilirubin Urine: NEGATIVE
Glucose, UA: >1000 mg/dL — AB
Ketones, ur: 40 mg/dL — AB
Leukocytes, UA: NEGATIVE
Nitrite: NEGATIVE
Protein, ur: NEGATIVE mg/dL
Specific Gravity, Urine: 1.01 (ref 1.005–1.030)
pH: 6 (ref 5.0–8.0)

## 2016-09-17 LAB — GLUCOSE, CAPILLARY
Glucose-Capillary: 137 mg/dL — ABNORMAL HIGH (ref 65–99)
Glucose-Capillary: 127 mg/dL — ABNORMAL HIGH (ref 65–99)
Glucose-Capillary: 148 mg/dL — ABNORMAL HIGH (ref 65–99)
Glucose-Capillary: 130 mg/dL — ABNORMAL HIGH (ref 65–99)
Glucose-Capillary: 141 mg/dL — ABNORMAL HIGH (ref 65–99)

## 2016-09-17 LAB — POCT ACTIVATED CLOTTING TIME
Activated Clotting Time: 219 seconds
Activated Clotting Time: 246 seconds
Activated Clotting Time: 252 seconds
Activated Clotting Time: 219 seconds
Activated Clotting Time: 153 seconds

## 2016-09-17 LAB — URINE MICROSCOPIC-ADD ON

## 2016-09-17 SURGERY — LOWER EXTREMITY ANGIOGRAPHY
Laterality: Right

## 2016-09-17 MED ORDER — CITALOPRAM HYDROBROMIDE 20 MG PO TABS
10.0000 mg | ORAL_TABLET | Freq: Every day | ORAL | Status: DC
  Administered 2016-09-18: 10:00:00 10 mg via ORAL
  Filled 2016-09-17 (×2): qty 1

## 2016-09-17 MED ORDER — ALUM & MAG HYDROXIDE-SIMETH 200-200-20 MG/5ML PO SUSP
30.0000 mL | ORAL | Status: DC | PRN
Start: 2016-09-17 — End: 2016-09-18
  Administered 2016-09-17: 15:00:00 30 mL via ORAL
  Filled 2016-09-17: qty 30

## 2016-09-17 MED ORDER — HYDRALAZINE HCL 20 MG/ML IJ SOLN
10.0000 mg | Freq: Once | INTRAMUSCULAR | Status: AC
  Administered 2016-09-17: 10 mg via INTRAVENOUS
  Filled 2016-09-17: qty 1

## 2016-09-17 MED ORDER — FENTANYL CITRATE (PF) 100 MCG/2ML IJ SOLN
INTRAMUSCULAR | Status: AC
  Filled 2016-09-17: qty 2

## 2016-09-17 MED ORDER — ASPIRIN EC 81 MG PO TBEC: 81.0000 mg | DELAYED_RELEASE_TABLET | Freq: Every day | ORAL | Status: DC

## 2016-09-17 MED ORDER — ASPIRIN 81 MG PO CHEW: 81.0000 mg | CHEWABLE_TABLET | ORAL | Status: DC

## 2016-09-17 MED ORDER — HYDRALAZINE HCL 20 MG/ML IJ SOLN
10.0000 mg | INTRAMUSCULAR | Status: DC | PRN
  Administered 2016-09-17: 10 mg via INTRAVENOUS
  Filled 2016-09-17: qty 1

## 2016-09-17 MED ORDER — HEPARIN (PORCINE) IN NACL 2-0.9 UNIT/ML-% IJ SOLN
INTRAMUSCULAR | Status: DC | PRN
  Administered 2016-09-17: 1000 mL

## 2016-09-17 MED ORDER — CLOPIDOGREL BISULFATE 300 MG PO TABS
ORAL_TABLET | ORAL | Status: DC | PRN
  Administered 2016-09-17: 300 mg via ORAL

## 2016-09-17 MED ORDER — VERAPAMIL HCL 2.5 MG/ML IV SOLN
INTRAVENOUS | Status: AC
  Filled 2016-09-17: qty 2

## 2016-09-17 MED ORDER — HEPARIN SODIUM (PORCINE) 1000 UNIT/ML IJ SOLN
INTRAMUSCULAR | Status: DC | PRN
  Administered 2016-09-17: 3000 [IU] via INTRAVENOUS
  Administered 2016-09-17: 8000 [IU] via INTRAVENOUS

## 2016-09-17 MED ORDER — IODIXANOL 320 MG/ML IV SOLN
INTRAVENOUS | Status: DC | PRN
  Administered 2016-09-17: 103 mL via INTRA_ARTERIAL

## 2016-09-17 MED ORDER — NITROGLYCERIN 1 MG/10 ML FOR IR/CATH LAB
INTRA_ARTERIAL | Status: DC | PRN
  Administered 2016-09-17 (×3): 200 ug via INTRA_ARTERIAL

## 2016-09-17 MED ORDER — MORPHINE SULFATE (PF) 2 MG/ML IV SOLN
2.0000 mg | INTRAVENOUS | Status: DC | PRN
  Administered 2016-09-17: 2 mg via INTRAVENOUS
  Filled 2016-09-17: qty 1

## 2016-09-17 MED ORDER — METOPROLOL TARTRATE 25 MG PO TABS
25.0000 mg | ORAL_TABLET | Freq: Two times a day (BID) | ORAL | Status: DC
  Administered 2016-09-17 – 2016-09-18 (×2): 25 mg via ORAL
  Filled 2016-09-17 (×3): qty 1

## 2016-09-17 MED ORDER — ACETAMINOPHEN 325 MG PO TABS: 650.0000 mg | ORAL_TABLET | ORAL | Status: DC | PRN

## 2016-09-17 MED ORDER — LIDOCAINE HCL (PF) 1 % IJ SOLN
INTRAMUSCULAR | Status: AC
  Filled 2016-09-17: qty 30

## 2016-09-17 MED ORDER — SALINE SPRAY 0.65 % NA SOLN: 1.0000 | Freq: Three times a day (TID) | NASAL | Status: DC | PRN

## 2016-09-17 MED ORDER — ROSUVASTATIN CALCIUM 20 MG PO TABS
40.0000 mg | ORAL_TABLET | Freq: Every day | ORAL | Status: DC
  Administered 2016-09-18: 40 mg via ORAL
  Filled 2016-09-17 (×2): qty 2

## 2016-09-17 MED ORDER — INSULIN ASPART 100 UNIT/ML ~~LOC~~ SOLN
0.0000 [IU] | Freq: Three times a day (TID) | SUBCUTANEOUS | Status: DC
  Administered 2016-09-17 – 2016-09-18 (×2): 2 [IU] via SUBCUTANEOUS

## 2016-09-17 MED ORDER — ONDANSETRON HCL 4 MG/2ML IJ SOLN: 4.0000 mg | Freq: Four times a day (QID) | INTRAMUSCULAR | Status: DC | PRN

## 2016-09-17 MED ORDER — SODIUM CHLORIDE 0.9% FLUSH: 3.0000 mL | INTRAVENOUS | Status: DC | PRN

## 2016-09-17 MED ORDER — L-METHYLFOLATE-B6-B12 3-35-2 MG PO TABS
1.0000 | ORAL_TABLET | Freq: Two times a day (BID) | ORAL | Status: DC
  Administered 2016-09-17 – 2016-09-18 (×2): 1 via ORAL
  Filled 2016-09-17 (×2): qty 1

## 2016-09-17 MED ORDER — INSULIN GLARGINE 100 UNIT/ML ~~LOC~~ SOLN
49.0000 [IU] | Freq: Every day | SUBCUTANEOUS | Status: DC
  Administered 2016-09-17: 22:00:00 49 [IU] via SUBCUTANEOUS
  Filled 2016-09-17 (×2): qty 0.49

## 2016-09-17 MED ORDER — CILOSTAZOL 100 MG PO TABS
100.0000 mg | ORAL_TABLET | Freq: Two times a day (BID) | ORAL | Status: DC
  Administered 2016-09-17: 100 mg via ORAL
  Filled 2016-09-17 (×2): qty 1

## 2016-09-17 MED ORDER — LIDOCAINE HCL (PF) 1 % IJ SOLN
INTRAMUSCULAR | Status: DC | PRN
  Administered 2016-09-17: 30 mL

## 2016-09-17 MED ORDER — SODIUM CHLORIDE 0.9 % WEIGHT BASED INFUSION: 1.0000 mL/kg/h | INTRAVENOUS | Status: DC

## 2016-09-17 MED ORDER — ANGIOPLASTY BOOK
Freq: Once | Status: AC
  Administered 2016-09-17: 20:00:00
  Filled 2016-09-17: qty 1

## 2016-09-17 MED ORDER — VITAMIN D 1000 UNITS PO TABS
1000.0000 [IU] | ORAL_TABLET | Freq: Every day | ORAL | Status: DC
  Administered 2016-09-18: 1000 [IU] via ORAL
  Filled 2016-09-17 (×2): qty 1

## 2016-09-17 MED ORDER — CHLORTHALIDONE 25 MG PO TABS
25.0000 mg | ORAL_TABLET | ORAL | Status: DC
  Administered 2016-09-18: 25 mg via ORAL
  Filled 2016-09-17: qty 1

## 2016-09-17 MED ORDER — SODIUM CHLORIDE 0.9 % IV SOLN
INTRAVENOUS | Status: AC
  Administered 2016-09-17: 13:00:00 via INTRAVENOUS

## 2016-09-17 MED ORDER — LABETALOL HCL 5 MG/ML IV SOLN
20.0000 mg | Freq: Once | INTRAVENOUS | Status: AC
  Administered 2016-09-17: 17:00:00 20 mg via INTRAVENOUS
  Filled 2016-09-17: qty 4

## 2016-09-17 MED ORDER — LOSARTAN POTASSIUM 50 MG PO TABS
50.0000 mg | ORAL_TABLET | Freq: Every day | ORAL | Status: DC
  Administered 2016-09-17 – 2016-09-18 (×2): 50 mg via ORAL
  Filled 2016-09-17 (×2): qty 1

## 2016-09-17 MED ORDER — HEPARIN SODIUM (PORCINE) 1000 UNIT/ML IJ SOLN
INTRAMUSCULAR | Status: AC
Start: 2016-09-17 — End: 2016-09-17
  Filled 2016-09-17: qty 1

## 2016-09-17 MED ORDER — HEPARIN (PORCINE) IN NACL 2-0.9 UNIT/ML-% IJ SOLN
INTRAMUSCULAR | Status: AC
  Filled 2016-09-17: qty 1000

## 2016-09-17 MED ORDER — FAMOTIDINE IN NACL 20-0.9 MG/50ML-% IV SOLN
20.0000 mg | Freq: Once | INTRAVENOUS | Status: AC
  Administered 2016-09-17: 20 mg via INTRAVENOUS
  Filled 2016-09-17: qty 50

## 2016-09-17 MED ORDER — CLOPIDOGREL BISULFATE 75 MG PO TABS
75.0000 mg | ORAL_TABLET | Freq: Every day | ORAL | Status: DC
  Administered 2016-09-18: 75 mg via ORAL
  Filled 2016-09-17 (×2): qty 1

## 2016-09-17 MED ORDER — MIDAZOLAM HCL 2 MG/2ML IJ SOLN
INTRAMUSCULAR | Status: AC
  Filled 2016-09-17: qty 2

## 2016-09-17 MED ORDER — HEPARIN SODIUM (PORCINE) 1000 UNIT/ML IJ SOLN
INTRAMUSCULAR | Status: AC
  Filled 2016-09-17: qty 1

## 2016-09-17 MED ORDER — FENTANYL CITRATE (PF) 100 MCG/2ML IJ SOLN
INTRAMUSCULAR | Status: DC | PRN
  Administered 2016-09-17: 25 ug via INTRAVENOUS

## 2016-09-17 MED ORDER — ASPIRIN EC 81 MG PO TBEC
81.0000 mg | DELAYED_RELEASE_TABLET | Freq: Every day | ORAL | Status: DC
  Administered 2016-09-18: 10:00:00 81 mg via ORAL
  Filled 2016-09-17: qty 1

## 2016-09-17 MED ORDER — PANTOPRAZOLE SODIUM 20 MG PO TBEC
20.0000 mg | DELAYED_RELEASE_TABLET | Freq: Every day | ORAL | Status: DC
  Administered 2016-09-18: 20 mg via ORAL
  Filled 2016-09-17 (×2): qty 1

## 2016-09-17 MED ORDER — NIFEDIPINE ER OSMOTIC RELEASE 30 MG PO TB24
30.0000 mg | ORAL_TABLET | Freq: Every day | ORAL | Status: DC
  Administered 2016-09-18: 08:00:00 30 mg via ORAL
  Filled 2016-09-17: qty 1

## 2016-09-17 MED ORDER — NITROGLYCERIN IN D5W 200-5 MCG/ML-% IV SOLN
INTRAVENOUS | Status: AC
  Filled 2016-09-17: qty 250

## 2016-09-17 MED ORDER — CLOPIDOGREL BISULFATE 300 MG PO TABS
ORAL_TABLET | ORAL | Status: AC
  Filled 2016-09-17: qty 1

## 2016-09-17 MED ORDER — INSULIN LISPRO 100 UNIT/ML (KWIKPEN): 7.0000 [IU] | PEN_INJECTOR | Freq: Two times a day (BID) | SUBCUTANEOUS | Status: DC

## 2016-09-17 MED ORDER — MONTELUKAST SODIUM 10 MG PO TABS: 10.0000 mg | ORAL_TABLET | Freq: Every day | ORAL | Status: DC

## 2016-09-17 MED ORDER — MIDAZOLAM HCL 2 MG/2ML IJ SOLN
INTRAMUSCULAR | Status: DC | PRN
  Administered 2016-09-17: 1 mg via INTRAVENOUS

## 2016-09-17 MED ORDER — PRAZOSIN HCL 2 MG PO CAPS: 3.0000 mg | ORAL_CAPSULE | Freq: Every day | ORAL | Status: DC

## 2016-09-17 MED ORDER — SODIUM CHLORIDE 0.9 % WEIGHT BASED INFUSION
3.0000 mL/kg/h | INTRAVENOUS | Status: DC
  Administered 2016-09-17: 3 mL/kg/h via INTRAVENOUS

## 2016-09-17 MED ORDER — ACETAMINOPHEN 325 MG PO TABS: 650.0000 mg | ORAL_TABLET | Freq: Four times a day (QID) | ORAL | Status: DC | PRN

## 2016-09-17 MED ORDER — CANAGLIFLOZIN 100 MG PO TABS
100.0000 mg | ORAL_TABLET | Freq: Every day | ORAL | Status: DC
  Filled 2016-09-17: qty 1

## 2016-09-17 SURGICAL SUPPLY — 27 items
BAG SNAP BAND KOVER 36X36 (MISCELLANEOUS) ×3 IMPLANT
BALLN ARMADA 7X20X80 (BALLOONS) ×3
BALLN ARMADA 8X20X80 (BALLOONS) ×3
BALLN IN.PACT DCB 6X120 (BALLOONS) ×3
BALLOON ARMADA 7X20X80 (BALLOONS) ×2 IMPLANT
BALLOON ARMADA 8X20X80 (BALLOONS) ×2 IMPLANT
CATH CXI SUPP ST 2.6FR 150CM (CATHETERS) ×3 IMPLANT
CATH SOFTOUCH MOTARJEME 5F (CATHETERS) ×3 IMPLANT
DCB IN.PACT 6X120 (BALLOONS) ×2 IMPLANT
DEVICE EMBOSHIELD NAV6 4.0-7.0 (WIRE) ×3 IMPLANT
DIAMONDBACK SOLID OAS 2.0MM (CATHETERS) ×3
GUIDEWIRE ANGLED .035X150CM (WIRE) ×3 IMPLANT
KIT PV (KITS) ×3 IMPLANT
LUBRICANT VIPERSLIDE CORONARY (MISCELLANEOUS) ×3 IMPLANT
SHEATH HIGHFLEX ANSEL 7FR 55CM (SHEATH) ×3 IMPLANT
SHEATH PINNACLE 5F 10CM (SHEATH) ×3 IMPLANT
SHEATH PINNACLE 7F 10CM (SHEATH) ×3 IMPLANT
SYRINGE MEDRAD AVANTA MACH 7 (SYRINGE) ×3 IMPLANT
SYSTEM DIMNDBCK SLD OAS 2.0MM (CATHETERS) ×2 IMPLANT
TAPE RADIOPAQUE TURBO (MISCELLANEOUS) ×6 IMPLANT
TRANSDUCER W/STOPCOCK (MISCELLANEOUS) ×3 IMPLANT
TRAY PV CATH (CUSTOM PROCEDURE TRAY) ×3 IMPLANT
TUBING CIL FLEX 10 FLL-RA (TUBING) ×3 IMPLANT
WIRE HITORQ VERSACORE ST 145CM (WIRE) ×3 IMPLANT
WIRE ROSEN-J .035X180CM (WIRE) ×3 IMPLANT
WIRE SPARTACORE .014X300CM (WIRE) ×3 IMPLANT
WIRE VIPER ADVANCE .017X335CM (WIRE) ×3 IMPLANT

## 2016-09-17 NOTE — H&P (View-Only) (Signed)
08/29/2016 [name]   1946/01/31  [id number]  Primary Physician Jani Gravel, MD Primary Cardiologist: Lorretta Harp MD Joel Rivera  HPI:  Mr. Criner is a very pleasant 70 year old severely overweight married African-American male father of 67, grandfather 3 grandchildren referred to me by Dr. Pernell Dupre for peripheral vascular evaluation. I last saw him in the office 08/07/16. He has history of CAD status post chronic catheterization in 2003. He has a remote history of tobacco abuse, treated hypertension, hypokalemia and diabetes. Lifestyle limiting claudication left greater than right for last 3 years. Dopplers performed 03/29/15 revealed a right ABI 0.79 with a high-frequency signal in the distal right SFA and left ABI of 0.6 with an occluded distal left SFA because of a serum creatinine 1.6 the patient was admitted for hydration the day prior to angiography. His serum creatinine fell from 1.6-1.2. 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 He wishes to proceed with staged revascularization of his right leg followed by his left leg in order to improve his walking ability.   Current Outpatient Prescriptions  Medication Sig Dispense Refill  . acetaminophen (TYLENOL) 325 MG tablet Take 650 mg by mouth every 6 (six) hours as needed (for pain.).    Marland Kitchen aspirin EC 81 MG tablet Take 81 mg by mouth daily.    . cetirizine (ZYRTEC ALLERGY) 10 MG tablet Take 10 mg by mouth daily.    . chlorthalidone (HYGROTON) 50 MG tablet Take 25 mg by mouth every other day.    . cholecalciferol (VITAMIN D) 1000 UNITS tablet Take 1,000 Units by mouth daily.    . cilostazol (PLETAL) 100 MG tablet TAKE 1 TABLET BY MOUTH TWICE DAILY 60 tablet 5  . citalopram (CELEXA) 20 MG tablet Take 10 mg by mouth daily.    Marland Kitchen HUMALOG KWIKPEN 100 UNIT/ML KiwkPen Inject 7-11 Units into  the skin 2 (two) times daily. 7 units in the morning before breakfast, 11 units at lunch    . insulin glargine (LANTUS) 100 UNIT/ML injection Inject 49 Units into the skin at bedtime.     Marland Kitchen JARDIANCE 25 MG TABS tablet Take 25 mg by mouth daily.     Marland Kitchen l-methylfolate-B6-B12 (METANX) 3-35-2 MG TABS Take 1 tablet by mouth 2 (two) times daily.     Marland Kitchen losartan (COZAAR) 50 MG tablet TAKE 1 TABLET(50 MG) BY MOUTH DAILY 90 tablet 2  . metoprolol tartrate (LOPRESSOR) 25 MG tablet Take 25 mg by mouth 2 (two) times daily.    . montelukast (SINGULAIR) 10 MG tablet Take 10 mg by mouth at bedtime.    Marland Kitchen NIFEdipine (PROCARDIA-XL/ADALAT CC) 30 MG 24 hr tablet Take 30 mg by mouth daily.    . pantoprazole (PROTONIX) 20 MG tablet Take 20 mg by mouth daily.     . prazosin (MINIPRESS) 1 MG capsule Take 3 mg by mouth at bedtime.    . rosuvastatin (CRESTOR) 40 MG tablet Take 40 mg by mouth daily.    . sodium chloride (OCEAN) 0.65 % SOLN nasal spray Place 1 spray into both nostrils 3 (three) times daily as needed for congestion.     No current facility-administered medications for this visit.     Allergies  Allergen Reactions  . Atorvastatin Other (See Comments)    Muscle pain/weaknesses (Myalgia)  . Pravastatin Other (See Comments)    Muscle pain/weaknesses (Myalgia)  . Simvastatin  Other (See Comments)    Muscle pain/weaknesses (Myalgia)  . Terazosin Other (See Comments)    Unknown reaction.    Social History   Social History  . Marital status: Married    Spouse name: N/A  . Number of children: N/A  . Years of education: N/A   Occupational History  . Not on file.   Social History Main Topics  . Smoking status: Former Smoker    Quit date: 11/01/1988  . Smokeless tobacco: Never Used  . Alcohol use No  . Drug use: No  . Sexual activity: Yes    Birth control/ protection: Condom   Other Topics Concern  . Not on file   Social History Narrative   No caffeine.     Review of Systems: General:  negative for chills, fever, night sweats or weight changes.  Cardiovascular: negative for chest pain, dyspnea on exertion, edema, orthopnea, palpitations, paroxysmal nocturnal dyspnea or shortness of breath Dermatological: negative for rash Respiratory: negative for cough or wheezing Urologic: negative for hematuria Abdominal: negative for nausea, vomiting, diarrhea, bright red blood per rectum, melena, or hematemesis Neurologic: negative for visual changes, syncope, or dizziness All other systems reviewed and are otherwise negative except as noted above.    Blood pressure (!) 142/66, pulse 88, height 5\' 8"  (1.727 m), weight 280 lb 9.6 oz (127.3 kg).  General appearance: alert and no distress Neck: no adenopathy, no carotid bruit, no JVD, supple, symmetrical, trachea midline and thyroid not enlarged, symmetric, no tenderness/mass/nodules Lungs: clear to auscultation bilaterally Heart: regular rate and rhythm, S1, S2 normal, no murmur, click, rub or gallop Extremities: extremities normal, atraumatic, no cyanosis or edema  EKG not performed today  ASSESSMENT AND PLAN:   Claudication [facility]) Mr. Happ returns today for post hospital follow-up after his recent angiogram performed 08/26/16 revealed a high-grade calcified right common femoral artery stenosis, bilateral segmental calcified SFA stenoses with three-vessel runoff. He was sent home the same day without intervention because of concerns regarding renal insufficiency and radiocontrast nephropathy. He wishes to proceed with vascularization. He will need diamondback orbital rotational atherectomy as well as drug eluting balloon angioplasty. Plan will be to fix the right side initially and states his left SFA.      Lorretta Harp MD FACP,FACC,FAHA, [facility] 08/29/2016 4:35 PM

## 2016-09-17 NOTE — Care Management Note (Addendum)
Case Management Note  Patient Details  Name: Joel Rivera. MRN: [id number] Date of Birth: 10-11-1946  Subjective/Objective:  S/p  Peripheral Vascular Atherectomy   , will be on plavix and asa, patient is a [employer], goes to the [facility] plavix will be new for him,  NCM will need to fax dc  information to [employer] [place] team 1A at dc.  NCM will cont to follow for dc needs.               Action/Plan:   Expected Discharge Date:                  Expected Discharge Plan:  Home/Self Care  In-House Referral:     Discharge planning Services  CM Consult  Post Acute Care Choice:    Choice offered to:     DME Arranged:    DME Agency:     HH Arranged:    HH Agency:     Status of Service:  Completed, signed off  If discussed at [employer] of Stay Meetings, dates discussed:    Additional Comments:  Zenon Mayo, RN 09/17/2016, 2:14 PM

## 2016-09-17 NOTE — Progress Notes (Signed)
Site area: left groin  Site Prior to Removal:  Level 0  Pressure Applied For 20 MINUTES    Minutes Beginning at 1655  Manual:   Yes.    Patient Status During Pull:  stable  Post Pull Groin Site:  Level 0  Post Pull Instructions Given:  Yes.    Post Pull Pulses Present:  Yes.    Dressing Applied:  Yes.    Comments:

## 2016-09-17 NOTE — Discharge Instructions (Signed)
Your procedure required the use of prolonged amounts of x-ray.  Radiation side-effects are unlikely but possible.  Please have a family member inspect your chest and back area daily, for signs of redness or rash two weeks from today.  Please call your provider and tell us whether if you have concerns about your findings.

## 2016-09-17 NOTE — Interval H&P Note (Signed)
History and Physical Interval Note:  09/17/2016 10:51 AM  Joel Rivera.  has presented today for surgery, with the diagnosis of pad  The various methods of treatment have been discussed with the patient and family. After consideration of risks, benefits and other options for treatment, the patient has consented to  Procedure(s): Lower Extremity Angiography (N/A) as a surgical intervention .  The patient's history has been reviewed, patient examined, no change in status, stable for surgery.  I have reviewed the patient's chart and labs.  Questions were answered to the patient's satisfaction.     [name]

## 2016-09-18 ENCOUNTER — Other Ambulatory Visit: Payer: Self-pay | Admitting: Cardiology

## 2016-09-18 DIAGNOSIS — I739 Peripheral vascular disease, unspecified: Secondary | ICD-10-CM | POA: Diagnosis not present

## 2016-09-18 DIAGNOSIS — Z7982 Long term (current) use of aspirin: Secondary | ICD-10-CM | POA: Diagnosis not present

## 2016-09-18 DIAGNOSIS — Z794 Long term (current) use of insulin: Secondary | ICD-10-CM | POA: Diagnosis not present

## 2016-09-18 DIAGNOSIS — E876 Hypokalemia: Secondary | ICD-10-CM | POA: Diagnosis not present

## 2016-09-18 DIAGNOSIS — Z87891 Personal history of nicotine dependence: Secondary | ICD-10-CM | POA: Diagnosis not present

## 2016-09-18 DIAGNOSIS — I2510 Atherosclerotic heart disease of native coronary artery without angina pectoris: Secondary | ICD-10-CM | POA: Diagnosis not present

## 2016-09-18 DIAGNOSIS — E1151 Type 2 diabetes mellitus with diabetic peripheral angiopathy without gangrene: Secondary | ICD-10-CM | POA: Diagnosis not present

## 2016-09-18 DIAGNOSIS — Z79899 Other long term (current) drug therapy: Secondary | ICD-10-CM | POA: Diagnosis not present

## 2016-09-18 DIAGNOSIS — Z888 Allergy status to other drugs, medicaments and biological substances status: Secondary | ICD-10-CM | POA: Diagnosis not present

## 2016-09-18 DIAGNOSIS — I10 Essential (primary) hypertension: Secondary | ICD-10-CM | POA: Diagnosis not present

## 2016-09-18 LAB — CBC
HCT: 39.8 % (ref 39.0–52.0)
Hemoglobin: 13.3 g/dL (ref 13.0–17.0)
MCH: 29.4 pg (ref 26.0–34.0)
MCHC: 33.4 g/dL (ref 30.0–36.0)
MCV: 87.9 fL (ref 78.0–100.0)
Platelets: 144 10*3/uL — ABNORMAL LOW (ref 150–400)
RBC: 4.53 MIL/uL (ref 4.22–5.81)
RDW: 13.6 % (ref 11.5–15.5)
WBC: 9.4 10*3/uL (ref 4.0–10.5)

## 2016-09-18 LAB — BASIC METABOLIC PANEL
Anion gap: 9 (ref 5–15)
BUN: 13 mg/dL (ref 6–20)
CO2: 23 mmol/L (ref 22–32)
Calcium: 8.8 mg/dL — ABNORMAL LOW (ref 8.9–10.3)
Chloride: 108 mmol/L (ref 101–111)
Creatinine, Ser: 1.39 mg/dL — ABNORMAL HIGH (ref 0.61–1.24)
GFR calc Af Amer: 57 mL/min — ABNORMAL LOW (ref >60)
GFR calc non Af Amer: 49 mL/min — ABNORMAL LOW (ref >60)
Glucose, Bld: 94 mg/dL (ref 65–99)
Potassium: 3.5 mmol/L (ref 3.5–5.1)
Sodium: 140 mmol/L (ref 135–145)

## 2016-09-18 LAB — GLUCOSE, CAPILLARY: Glucose-Capillary: 135 mg/dL — ABNORMAL HIGH (ref 65–99)

## 2016-09-18 MED ORDER — CARVEDILOL 25 MG PO TABS: 25.0000 mg | ORAL_TABLET | Freq: Two times a day (BID) | ORAL | 12 refills | Status: AC

## 2016-09-18 MED ORDER — LOSARTAN POTASSIUM 50 MG PO TABS
50.0000 mg | ORAL_TABLET | Freq: Once | ORAL | Status: AC
  Administered 2016-09-18: 10:00:00 50 mg via ORAL
  Filled 2016-09-18: qty 1

## 2016-09-18 MED ORDER — LOSARTAN POTASSIUM 50 MG PO TABS: 100.0000 mg | ORAL_TABLET | Freq: Every day | ORAL | Status: DC

## 2016-09-18 MED ORDER — LOSARTAN POTASSIUM 100 MG PO TABS: 100.0000 mg | ORAL_TABLET | Freq: Every day | ORAL | 12 refills | Status: DC

## 2016-09-18 MED ORDER — CLOPIDOGREL BISULFATE 75 MG PO TABS: 75.0000 mg | ORAL_TABLET | Freq: Every day | ORAL | 12 refills | Status: DC

## 2016-09-18 NOTE — Progress Notes (Signed)
Inpatient Diabetes Program Recommendations  AACE/ADA: New Consensus Statement on Inpatient Glycemic Control (2015)  Target Ranges:  Prepandial:   less than 140 mg/dL      Peak postprandial:   less than 180 mg/dL (1-2 hours)      Critically ill patients:  140 - 180 mg/dL   Lab Results  Component Value Date   GLUCAP 135 (H) 09/18/2016    Review of Glycemic Control  Results for HINES, CARRAHER (MRN [id number]) as of 09/18/2016 10:57  Ref. Range 09/17/2016 09:16 09/17/2016 14:23 09/17/2016 18:12 09/17/2016 21:04 09/18/2016 06:37  Glucose-Capillary Latest Ref Range: 65 - 99 mg/dL 127 (H) 148 (H) 130 (H) 141 (H) 135 (H)   Diabetes history: Type 2 Outpatient Diabetes medications: Lantus 49 units qhs, Jardience 25mg  qday, Humalog 7 units qam, Humalog11 units at lunch Current orders for Inpatient glycemic control: Lantus 49 units qhs, Invokana 100mg  q day, Novolog 0-15 units tid with meals.  Inpatient Diabetes Program Recommendations:  Pharmacy recommending d/c Invokana (see physician sticky note dated today)  Gentry Fitz, RN, BA, MHA, CDE Diabetes Coordinator Inpatient Glycemic Control Team [phone] (Team Pager) [phone] ([place]) 09/18/2016 11:07 AM

## 2016-09-18 NOTE — Progress Notes (Signed)
Patient Name: Joel Rivera. Date of Encounter: 09/18/2016  Primary Cardiologist: Dr. Serena Croissant Problem List     Active Problems:   Claudication [facility])   Peripheral arterial disease ([place])   Claudication in peripheral vascular disease ([place])     Subjective   Feels well, no pain at groin site or leg pain.   Inpatient Medications    Scheduled Meds: . aspirin EC  81 mg Oral Daily  . canagliflozin  100 mg Oral QAC breakfast  . chlorthalidone  25 mg Oral QODAY  . cholecalciferol  1,000 Units Oral Daily  . cilostazol  100 mg Oral BID  . citalopram  10 mg Oral Daily  . clopidogrel  75 mg Oral Q breakfast  . insulin aspart  0-15 Units Subcutaneous TID WC  . insulin glargine  49 Units Subcutaneous QHS  . l-methylfolate-B6-B12  1 tablet Oral BID  . losartan  50 mg Oral Daily  . metoprolol tartrate  25 mg Oral BID  . montelukast  10 mg Oral QHS  . NIFEdipine  30 mg Oral Daily  . pantoprazole  20 mg Oral Daily  . prazosin  3 mg Oral QHS  . rosuvastatin  40 mg Oral Daily   Continuous Infusions:  PRN Meds: acetaminophen, acetaminophen, alum & mag hydroxide-simeth, hydrALAZINE, morphine injection, ondansetron (ZOFRAN) IV, sodium chloride   Vital Signs    Vitals:   09/17/16 2000 09/18/16 0412 09/18/16 0551 09/18/16 0735  BP: (!) 153/56 (!) 157/52 (!) 153/76 (!) 192/60  Pulse: 88 85 76 95  Resp: (!) 33 (!) 23 (!) 26 (!) 27  Temp: 98.2 F (36.8 C) 98.2 F (36.8 C) 98 F (36.7 C) 98.5 F (36.9 C)  TempSrc:    Oral  SpO2: 97% 98% 97% 98%  Weight:   279 lb 15.8 oz (127 kg)   Height:        Intake/Output Summary (Last 24 hours) at 09/18/16 0833 Last data filed at 09/18/16 [id number]  Gross per 24 hour  Intake             1840 ml  Output             2300 ml  Net             -460 ml   Filed Weights   09/17/16 0540 09/18/16 0551  Weight: 281 lb (127.5 kg) 279 lb 15.8 oz (127 kg)    Physical Exam   GEN: Well nourished, well developed, obese male in no acute  distress.  HEENT: Grossly normal.  Neck: Supple, no JVD, carotid bruits, or masses. Cardiac: RRR, no murmurs, rubs, or gallops. No clubbing, cyanosis, edema.  Radials/DP/PT 2+ and equal bilaterally.  Respiratory:  Respirations regular and unlabored, clear to auscultation bilaterally. GI: Soft, nontender, nondistended, BS + x 4. MS: no deformity or atrophy. Skin: warm and dry, no rash. Neuro:  Strength and sensation are intact. Psych: AAOx3.  Normal affect.  Labs    CBC  Recent Labs  09/15/16 1107 09/18/16 0514  WBC 6.9 9.4  NEUTROABS 3,588  --   HGB 13.6 13.3  HCT 40.6 39.8  MCV 88.8 87.9  PLT 170 123456*   Basic Metabolic Panel  Recent Labs  09/15/16 1107 09/18/16 0514  NA 138 140  K 3.9 3.5  CL 106 108  CO2 25 23  GLUCOSE 114* 94  BUN 18 13  CREATININE 1.46* 1.39*  CALCIUM 8.8 8.8*     Telemetry    NSR -  Personally Reviewed  ECG    Last was 08/26/16, NSR with anterolateral t wave inversion. - Personally Reviewed  Radiology    No results found.  Cardiac Studies   Lower Extremity Angiography  Peripheral Vascular Atherectomy 09/17/16   Final Impression: Successful right common femoral and SFA diamondback orbital rotational atherectomy, drug-eluting balloon angioplasty using contralateral access and distal protection for lifestyle limiting claudication. The patient tolerated the procedure well. He left the lab in stable condition. The sheath will be removed once the eighth because below 170 and pressure held. He'll be hydrated overnight and discharged home in the morning. We will get lower extremity arterial Doppler studies on his right lower extremity in an [facility] office next week and I will see him back in 2-3 weeks thereafter. At that time we'll consider staged intervention of his left common femoral artery.    Patient Profile  Mr. Laos is a 70 year old male with a past medical history of CAD, HTN, and DM. He underwent successful right common  femoral and SFA rotational atherectomy and drug eluding balloon angioplasty on 09/17/16.   Assessment & Plan    1. Peripheral arterial disease: PV report above. He will need a staged intervention to his left femoral artery in the future. He will remain on ASA and Plavix. He is stable this am, groin site was stable without hematoma.   It should be noted that the patient's radiation dose was above the upper limit of normal and this will need to be rechecked during his first post hospital visit.  I will schedule him lower extremity arterial Doppler studies on his right lower extremity next week and Dr. Gwenlyn Found will see him back in 2-3 weeks thereafter.  2. HTN: Hypertensive throughout the night and this am. He is on chlorthalidone, losartan 50mg  daily, prazosin 3mg , metoprolol 25mg  BID. Would increase losartan to 100mg  this am. Renal function is at baseline, sCr is 1.39. Also would change metoprolol to Coreg for better BP control.   3. History of CAD  4. DM  Signed, Arbutus Leas, NP  09/18/2016, 8:33 AM   Attending Note:   The patient was seen and examined.  Agree with assessment and plan as noted above.  Changes made to the above note as needed.  Patient seen and independently examined with Jettie Booze, NP.   We discussed all aspects of the encounter. I agree with the assessment and plan as stated above.  Pt is stable for DC  No leg pain  Groin looks stable  Received upper limits of normal for radiation. Will need to be assessed for radiation burns    I have spent a total of 40 minutes with patient reviewing hospital  notes , telemetry, EKGs, labs and examining patient as well as establishing an assessment and plan that was discussed with the patient. > 50% of time was spent in direct patient care.    Thayer Headings, Brooke Bonito., MD, [facility] 09/18/2016, 9:21 AM [address],  [place] Pager [phone]

## 2016-09-18 NOTE — Discharge Summary (Signed)
Discharge Summary    Patient ID: Joel Sittler.,  MRN: [id number], DOB/AGE: Jun 07, 1946 70 y.o.  Admit date: 09/17/2016 Discharge date: 09/18/2016  Primary Care Provider: Jani Gravel Primary Cardiologist: Dr. Gwenlyn Found Dr. Tamala Julian   Discharge Diagnoses    Active Problems:   Claudication [facility])   Peripheral arterial disease ([place])   Claudication in peripheral vascular disease ([place])   Allergies Allergies  Allergen Reactions  . Atorvastatin Other (See Comments)    Muscle pain/weaknesses ([place])  . Pravastatin Other (See Comments)    Muscle pain/weaknesses ([place])  . Simvastatin Other (See Comments)    Muscle pain/weaknesses ([place])  . Terazosin Other (See Comments)    Unknown reaction.    Diagnostic Studies/Procedures    Lower Extremity Angiography  Peripheral Vascular Atherectomy   History obtained from chart review.Joel Rivera is a very pleasant 70 year old severely overweight married African-American male father of 45, grandfather 3 grandchildren referred to me by Dr. Pernell Dupre for peripheral vascular evaluation.I last saw him in the office 08/07/16.He has history of CAD status post chronic catheterization in 2003. He has a remote history of tobacco abuse, treated hypertension, hypokalemia and diabetes. Lifestyle limiting claudication left greater than right for last 3 years. Dopplers performed 03/29/15 revealed a right ABI 0.79 with a high-frequency signal in the distal right SFA and left ABI of 0.6 with an occluded distal left SFAbecause of a serum creatinine 1.6 the patient was admitted for hydration the day prior to angiography. His serum creatinine fell from 1.6-1.2. 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 He wishes to proceed with staged revascularization of his right leg followed by his left leg in order to improve his walking  ability.  Pre Procedure Diagnosis: Peripheral arterial disease  Post Procedure Diagnosis: Peripheral arterial disease  Operators: Dr. [name]  Procedures Performed:            1. Left groin access, contralateral access (second order catheter placement)            2. Basement of an NAV  6 distal protection device and right popliteal artery            3. Diamondback orbital rotational atherectomy right common femoral artery and right SFA            4. Drug eluting balloon angioplasty right SFA, balloon angioplasty right common femoral artery            5. Retrieval of distal protection device, right lower extremity angiography with runoff  PROCEDURE DESCRIPTION:   The patient was brought to the second floor [facility] cath lab in the the postabsorptive state. He was premedicated with Valium 5 mg by mouth, IV Versed and fentanyl. His left groin was prepped and shaved in usual sterile fashion. Xylocaine 1% was used for local anesthesia. A 5 French sheath was inserted into the left common femoral  artery using standard Seldinger technique. Contralateral access was obtained with a 5 French Motarjeme catheter along with a 035 Glidewire and 035 Rosen wire. I then placed a 7 French 55 cm multipurpose and serosal sheath into the right external iliac artery (second order catheter placement). The patient received a total of 11,000 units of heparin with an ACT of 219. Total contrast administered to the patient was 130 mL.  I was able to cross the high-grade calcified right common femoral artery and right SFA with an  014 [place] core wire and an 018 CXI end hole  Catheter. I then exchanged out for an 014 Viper wire and placed a NAV 6 distal protection device in the right popliteal artery. Following this I performed diamondback orbital rotational atherectomy of the right common femoral artery and right SFA up to 120,000 rpm's. I administered copious amounts of intra-arterial nitroglycerin.  Following this I performed a drug eluting balloon angioplasty with a 6 mm x 120 mm long admiral drug-eluting balloon in the mid right SFA at nominal pressures for 3 minutes. I then performed balloon angioplasty of the right common femoral artery with a 7 mm x 2 cm balloon upsizing to an 8 mm x 2 cm balloon. Final angiographic result was reduction of a 90+ percent calcified segmental mid right SFA stenosis less than 40% and a 95% focal calcified right common femoral artery stenosis to less than 10% residual. The NAV 6 distal protection device was then recaptured and completion and angiography was performed using bolus chase, digital subtraction and step table technique. The 7 French sheath with then was withdrawn across the bifurcation and exchanged over an 035 wire for a short 7 [place] sheath which was then secured in place. The patient did receive 300 mg of by mouth Plavix.  It should be noted that the patient's radiation dose was above the upper limit of normal and this will need to be rechecked during his first post hospital visit.  Final Impression: Successful right common femoral and SFA diamondback orbital rotational atherectomy, drug-eluting balloon angioplasty using contralateral access and distal protection for lifestyle limiting claudication. The patient tolerated the procedure well. He left the lab in stable condition. The sheath will be removed once the eighth because below 170 and pressure held. He'll be hydrated overnight and discharged home in the morning. We will get lower extremity arterial Doppler studies on his right lower extremity in an [facility] office next week and I will see him back in 2-3 weeks thereafter. At that time we'll consider staged intervention of his left common femoral artery.  _____________   History of Present Illness     Joel Rivera is a very pleasant 70 year old overweight married African-American male father of 80, grandfather 3 grandchildren referred to Dr.  Gwenlyn Found by Dr. Pernell Dupre for peripheral vascular evaluation.He has history of CAD status post cardiac catheterization in 2003. He has a remote history of tobacco abuse, treated hypertension, hypokalemia and diabetes. Lifestyle limiting claudication left greater than right for last 3 years. Dopplers performed 03/29/15 revealed a right ABI 0.79 with a high-frequency signal in the distal right SFA and left ABI of 0.6 with an occluded distal left SFAbecause of a serum creatinine 1.6 the patient was admitted for hydration the day prior to angiography. His serum creatinine fell from 1.6-1.2.   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 He wishes to proceed with staged revascularization of his right leg followed by his left leg in order to improve his walking ability.   Hospital Course     Full PV report above. He had successful right common femoral and SFA diamondback orbital rotational atherectomy, drug-eluting balloon angioplasty using contralateral access and distal protection.  It should be noted that the patient's radiation dose was above the upper limit of normal and this will need to be rechecked during his first post hospital visit.  He was stable post procedure and left groin  site was without hematoma. He was hypertensive post procedure and his losartan was increased to 100mg  from 50mg  daily. His metoprolol 25mg  BID was changed to Coreg 25 mg BID for better control of his BP.   He will have right lower extremity arterial Doppler studies next week and see Dr. Gwenlyn Found 3 weeks thereafter.   He was seen today by Dr. Acie Fredrickson and deemed suitable for discharge.  _____________  Discharge Vitals Blood pressure (!) 161/66, pulse 95, temperature 98.5 F (36.9 C), temperature source Oral, resp. rate (!) 21, height 5\' 8"  (1.727 m), weight 279 lb 15.8 oz (127 kg), SpO2 98 %.   Filed Weights   09/17/16 0540 09/18/16 0551  Weight: 281 lb (127.5 kg) 279 lb 15.8 oz (127 kg)    Labs & Radiologic Studies     CBC  Recent Labs  09/18/16 0514  WBC 9.4  HGB 13.3  HCT 39.8  MCV 87.9  PLT 123456*   Basic Metabolic Panel  Recent Labs  09/18/16 0514  NA 140  K 3.5  CL 108  CO2 23  GLUCOSE 94  BUN 13  CREATININE 1.39*  CALCIUM 8.8*    Dg Chest 2 View  Result Date: 08/21/2016 CLINICAL DATA:  Preoperative evaluation prior to the angio procedure. Prior history of smoking. EXAM: CHEST  2 VIEW COMPARISON:  None available. FINDINGS: Transverse heart size at the upper limits of normal. Mediastinal silhouette within normal limits. Lungs are mildly hypoinflated with elevation of the left hemidiaphragm. Diffuse peribronchial thickening present, suspected to be related history of smoking. No consolidative airspace disease. No pulmonary edema or pleural effusion. No pneumothorax. No acute osseous abnormality. Multilevel degenerative changes noted within the visualized spine. Degenerative changes also noted about the partially visualized left shoulder. IMPRESSION: 1. Diffuse peribronchial thickening, which may be related to provided history smoking. Possible acute bronchiolitis could be considered in the correct clinical setting. 2. No other active cardiopulmonary disease. Electronically Signed   By: Jeannine Boga M.D.   On: 08/21/2016 13:46    Disposition   Pt is being discharged home today in good condition.  Follow-up Plans & Appointments    Follow-up Information    [facility] Follow up on 09/28/2016.   Specialty:  Cardiology Why:  at 3:30pm for doppler studies.  Contact information: [address] [place]       [name], MD Follow up on 10/17/2016.   Specialties:  Cardiology, Radiology Why:  at 10:30 am for follow up.  Contact information: [address] [place] [address] [phone]          Discharge Instructions    Diet - low sodium heart healthy    Complete by:  As directed    Discharge instructions    Complete by:  As directed    Groin Site Care Refer to this sheet in the next few weeks. These instructions provide you with information on caring for yourself after your procedure. Your caregiver may also give you more specific instructions. Your treatment has been planned according to current medical practices, but problems sometimes occur. Call your caregiver if you have any problems or questions after your procedure. HOME CARE INSTRUCTIONS You may shower 24 hours after the procedure. Remove the bandage (dressing) and gently wash the site with plain soap and water. Gently pat the site dry.  Do not apply powder or lotion to the site.  Do not sit in a bathtub, swimming pool,  or whirlpool for 5 to 7 days.  No bending, squatting, or lifting anything over 10 pounds (4.5 kg) as directed by your caregiver.  Inspect the site at least twice daily.  Do not drive home if you are discharged the same day of the procedure. Have someone else drive you.  You may drive 24 hours after the procedure unless otherwise instructed by your caregiver.  What to expect: Any bruising will usually fade within 1 to 2 weeks.  Blood that collects in the tissue (hematoma) may be painful to the touch. It should usually decrease in size and tenderness within 1 to 2 weeks.  SEEK IMMEDIATE MEDICAL CARE IF: You have unusual pain at the groin site or down the affected leg.  You have redness, warmth, swelling, or pain at the groin site.  You have drainage (other than a small amount of blood on the dressing).  You have chills.  You have a fever or persistent symptoms for more than 72 hours.  You have a fever and your symptoms suddenly get worse.  Your leg becomes pale, cool, tingly, or numb.  You have heavy bleeding from the site. Hold pressure on the site. .    Increase activity slowly    Complete by:  As directed       Discharge Medications   Discharge Medication List as of 09/18/2016 10:53 AM    START taking these medications   Details  carvedilol (COREG) 25 MG tablet Take 1 tablet (25 mg total) by mouth 2 (two) times daily with a meal., Starting Tue 09/18/2016, Normal    clopidogrel (PLAVIX) 75 MG tablet Take 1 tablet (75 mg total) by mouth daily., Starting Tue 09/18/2016, Normal      CONTINUE these medications which have CHANGED   Details  losartan (COZAAR) 100 MG tablet Take 1 tablet (100 mg total) by mouth daily., Starting Wed 09/19/2016, Normal      CONTINUE these medications which have NOT CHANGED   Details  acetaminophen (TYLENOL) 325 MG tablet Take 650 mg by mouth every 6 (six) hours as needed (for pain.)., Historical Med    aspirin EC 81 MG tablet Take 81 mg by mouth daily., Historical Med    cetirizine (ZYRTEC ALLERGY) 10 MG tablet Take 10 mg by mouth daily., Historical Med    chlorthalidone (HYGROTON) 50 MG tablet Take 25 mg by mouth every other day., Historical Med    cholecalciferol (VITAMIN D) 1000 UNITS tablet Take 1,000 Units by mouth daily., Historical Med    citalopram (CELEXA) 20 MG tablet Take 10 mg by mouth daily., Historical Med    HUMALOG KWIKPEN 100 UNIT/ML KiwkPen Inject 7-11 Units into the skin 2 (two) times daily. 7 units in the morning before breakfast, 11 units at lunch, Starting Tue 09/13/2015, Historical Med    insulin glargine (LANTUS) 100 UNIT/ML injection Inject 49 Units into the skin at bedtime. , Historical Med    JARDIANCE 25 MG TABS tablet Take 25 mg by mouth daily. , Starting Wed 09/15/2014, Historical Med    l-methylfolate-B6-B12 (METANX) 3-35-2 MG TABS Take 1 tablet by mouth 2 (two) times daily. , Historical Med    montelukast (SINGULAIR) 10 MG tablet Take 10 mg by mouth at bedtime., Historical Med    NIFEdipine (PROCARDIA-XL/ADALAT CC) 30 MG 24 hr tablet Take 30 mg by mouth daily.,  Historical Med    pantoprazole (PROTONIX) 20 MG tablet Take 20 mg by mouth daily. , Historical Med    prazosin (MINIPRESS) 1 MG  capsule Take 3 mg by mouth at bedtime., Historical Med    rosuvastatin (CRESTOR) 40 MG tablet Take 40 mg by mouth daily., Historical Med    sodium chloride (OCEAN) 0.65 % SOLN nasal spray Place 1 spray into both nostrils 3 (three) times daily as needed for congestion., Historical Med      STOP taking these medications     cilostazol (PLETAL) 100 MG tablet      metoprolol tartrate (LOPRESSOR) 25 MG tablet          Outstanding Labs/Studies     Duration of Discharge Encounter   Greater than 30 minutes including physician time.  Signed, Arbutus Leas NP 09/18/2016, 11:35 AM   Attending Note:   The patient was seen and examined.  Agree with assessment and plan as noted above.  Changes made to the above note as needed.  Patient seen and independently examined with Jettie Booze, NP.   We discussed all aspects of the encounter. I agree with the assessment and plan as stated above.  Pt has done well after PV procedure. Follow up with Dr. Gwenlyn Found    I have spent a total of 40 minutes with patient reviewing hospital  notes , telemetry, EKGs, labs and examining patient as well as establishing an assessment and plan that was discussed with the patient. > 50% of time was spent in direct patient care.    Thayer Headings, Brooke Bonito., MD, [facility] 09/18/2016, 4:43 PM [address],  [place] Pager [phone]

## 2016-09-19 MED FILL — Verapamil HCl IV Soln 2.5 MG/ML: INTRAVENOUS | Qty: 2 | Status: AC

## 2016-09-20 ENCOUNTER — Other Ambulatory Visit: Payer: Self-pay | Admitting: Cardiology

## 2016-09-20 DIAGNOSIS — I739 Peripheral vascular disease, unspecified: Secondary | ICD-10-CM

## 2016-09-28 ENCOUNTER — Encounter (HOSPITAL_COMMUNITY): Payer: Medicare Other

## 2016-10-02 ENCOUNTER — Ambulatory Visit (HOSPITAL_COMMUNITY)
Admission: RE | Admit: 2016-10-02 | Discharge: 2016-10-02 | Disposition: A | Discharge status: Home / Self Care | Payer: Medicare Other | Source: Ambulatory Visit | Attending: Cardiovascular Disease | Admitting: Cardiovascular Disease

## 2016-10-02 DIAGNOSIS — I739 Peripheral vascular disease, unspecified: Secondary | ICD-10-CM | POA: Diagnosis not present

## 2016-10-03 ENCOUNTER — Telehealth: Payer: Self-pay | Admitting: Cardiovascular Disease

## 2016-10-03 DIAGNOSIS — E119 Type 2 diabetes mellitus without complications: Secondary | ICD-10-CM | POA: Diagnosis not present

## 2016-10-03 DIAGNOSIS — I739 Peripheral vascular disease, unspecified: Secondary | ICD-10-CM

## 2016-10-03 DIAGNOSIS — I10 Essential (primary) hypertension: Secondary | ICD-10-CM | POA: Diagnosis not present

## 2016-10-03 NOTE — Telephone Encounter (Signed)
Results given to pt wife--OK per DPR. Pt wife verbalized understanding.  Repeat Dopplers ordered.

## 2016-10-03 NOTE — Telephone Encounter (Signed)
-----   Message from Lorretta Harp, MD sent at 10/03/2016  1:43 PM EST ----- Improved RABI s/p intervention. Repeat 6 months

## 2016-10-15 ENCOUNTER — Encounter: Payer: Self-pay | Admitting: Interventional Cardiology

## 2016-10-15 ENCOUNTER — Ambulatory Visit (INDEPENDENT_AMBULATORY_CARE_PROVIDER_SITE_OTHER): Payer: Medicare Other | Admitting: Interventional Cardiology

## 2016-10-15 VITALS — BP 128/72 | HR 68 | Ht 68.0 in | Wt 285.4 lb

## 2016-10-15 DIAGNOSIS — G4733 Obstructive sleep apnea (adult) (pediatric): Secondary | ICD-10-CM

## 2016-10-15 DIAGNOSIS — E1022 Type 1 diabetes mellitus with diabetic chronic kidney disease: Secondary | ICD-10-CM

## 2016-10-15 DIAGNOSIS — I739 Peripheral vascular disease, unspecified: Secondary | ICD-10-CM | POA: Diagnosis not present

## 2016-10-15 DIAGNOSIS — N183 Chronic kidney disease, stage 3 unspecified: Secondary | ICD-10-CM

## 2016-10-15 DIAGNOSIS — I2510 Atherosclerotic heart disease of native coronary artery without angina pectoris: Secondary | ICD-10-CM | POA: Diagnosis not present

## 2016-10-15 DIAGNOSIS — I119 Hypertensive heart disease without heart failure: Secondary | ICD-10-CM

## 2016-10-15 NOTE — Progress Notes (Signed)
Cardiology Office Note    Date:  10/15/2016   ID:  Joel Henri., DOB 02/01/1946, MRN [id number]  PCP:  Jani Gravel, MD  Cardiologist: Sinclair Grooms, MD   Chief Complaint  Patient presents with  . Coronary Artery Disease  . Claudication    History of Present Illness:  Joel Rivera. is a 70 y.o. male a history of CAD (no real specifics noted), essential hypertension with severe LVH on echo, diastolic dysfunction, obesity, hyperlipidemia, OSA - on CPAP and claudication/PVD. He has PTSD   The right lower extremity is improved after percutaneous intervention in the right iliac and superficial femoral by Dr. Gwenlyn Found. He is still having difficulty with the left lower extremity.  A nuclear myocardial perfusion study on the heart was low risk one performed earlier this fall. We discuss those results.  The bilateral carotid Doppler study performed in May/June timeframe reveal less than 60% stenosis bilaterally and will be repeated in May.  Past Medical History:  Diagnosis Date  . Agent orange exposure   . CAD (coronary artery disease)   . CHF (congestive heart failure) ([place])   . Chronic kidney disease (CKD), active medical management without dialysis, stage 3 (moderate)   . Chronic post-traumatic stress disorder (PTSD) 03/16/2015  . Claustrophobia 03/16/2015  . GERD (gastroesophageal reflux disease)   . Hyperlipidemia   . Hypertension   . Neuropathy ([place])   . Obesity   . OSA (obstructive sleep apnea)   . OSA (obstructive sleep apnea) 03/16/2015  . PTSD (post-traumatic stress disorder)   . Recurrent boils    abceses  . Sinus infection    frequent  . Type II diabetes mellitus ([place])     Past Surgical History:  Procedure Laterality Date  . ABDOMINAL HERNIA REPAIR  ~ 2008  . CORONARY ANGIOPLASTY  ~ 2004  . CYST EXCISION Left 2008   inguinal area  . HERNIA REPAIR    . PERIPHERAL VASCULAR CATHETERIZATION N/A 08/27/2016   Procedure: Lower Extremity Angiography;  Surgeon:  Lorretta Harp, MD;  Location: [facility];  Service: Cardiovascular;  Laterality: N/A;  . PERIPHERAL VASCULAR CATHETERIZATION N/A 09/17/2016   Procedure: Lower Extremity Angiography;  Surgeon: Lorretta Harp, MD;  Location: [facility];  Service: Cardiovascular;  Laterality: N/A;  . PERIPHERAL VASCULAR CATHETERIZATION Right 09/17/2016   Procedure: Peripheral Vascular Atherectomy;  Surgeon: Lorretta Harp, MD;  Location: [facility];  Service: Cardiovascular;  Laterality: Right;  Common Femoral     Current Medications: Outpatient Medications Prior to Visit  Medication Sig Dispense Refill  . acetaminophen (TYLENOL) 325 MG tablet Take 650 mg by mouth every 6 (six) hours as needed (for pain.).    Marland Kitchen aspirin EC 81 MG tablet Take 81 mg by mouth daily.    . carvedilol (COREG) 25 MG tablet Take 1 tablet (25 mg total) by mouth 2 (two) times daily with a meal. 60 tablet 12  . cetirizine (ZYRTEC ALLERGY) 10 MG tablet Take 10 mg by mouth daily.    . chlorthalidone (HYGROTON) 50 MG tablet Take 25 mg by mouth every other day.    . cholecalciferol (VITAMIN D) 1000 UNITS tablet Take 1,000 Units by mouth daily.    . citalopram (CELEXA) 20 MG tablet Take 10 mg by mouth daily.    . clopidogrel (PLAVIX) 75 MG tablet Take 1 tablet (75 mg total) by mouth daily. 30 tablet 12  . HUMALOG KWIKPEN 100 UNIT/ML KiwkPen Inject 7-11  Units into the skin 2 (two) times daily. 7 units in the morning before breakfast, 11 units at lunch    . insulin glargine (LANTUS) 100 UNIT/ML injection Inject 49 Units into the skin at bedtime.     Marland Kitchen JARDIANCE 25 MG TABS tablet Take 25 mg by mouth daily.     Marland Kitchen l-methylfolate-B6-B12 (METANX) 3-35-2 MG TABS Take 1 tablet by mouth 2 (two) times daily.     Marland Kitchen losartan (COZAAR) 100 MG tablet Take 1 tablet (100 mg total) by mouth daily. 30 tablet 12  . montelukast (SINGULAIR) 10 MG tablet Take 10 mg by mouth at bedtime.    Marland Kitchen NIFEdipine (PROCARDIA-XL/ADALAT CC) 30 MG 24 hr  tablet Take 30 mg by mouth daily.    . pantoprazole (PROTONIX) 20 MG tablet Take 20 mg by mouth daily.     . prazosin (MINIPRESS) 1 MG capsule Take 3 mg by mouth at bedtime.    . rosuvastatin (CRESTOR) 40 MG tablet Take 40 mg by mouth daily.    . sodium chloride (OCEAN) 0.65 % SOLN nasal spray Place 1 spray into both nostrils 3 (three) times daily as needed for congestion.     No facility-administered medications prior to visit.      Allergies:   Atorvastatin; Pravastatin; Simvastatin; and Terazosin   Social History   Social History  . Marital status: Married    Spouse name: N/A  . Number of children: N/A  . Years of education: N/A   Social History Main Topics  . Smoking status: Former Smoker    Packs/day: 3.00    Years: 13.00    Types: Cigarettes    Quit date: 51  . Smokeless tobacco: Never Used  . Alcohol use 0.0 oz/week     Comment: 09/17/2016 "quit years ago"  . Drug use: No  . Sexual activity: Yes    Birth control/ protection: Condom   Other Topics Concern  . None   Social History Narrative   No caffeine.     Family History:  The patient's family history includes Aneurysm in his father; Dementia in his sister; Healthy in his sister; Pneumonia in his mother; Sarcoidosis in his sister; Stroke in his sister; Urinary tract infection in his mother.   ROS:   Please see the history of present illness.    Some back discomfort, constipation, but otherwise unremarkable  All other systems reviewed and are negative.   PHYSICAL EXAM:   VS:  BP 128/72 (BP Location: Left Arm)   Pulse 68   Ht 5\' 8"  (1.727 m)   Wt 285 lb 6.4 oz (129.5 kg)   BMI 43.39 kg/m    GEN: Well nourished, well developed, in no acute distress  HEENT: normal  Neck: no JVD, carotid bruits, or masses Cardiac: RRR; no murmurs, rubs, or gallops,no edema  Respiratory:  clear to auscultation bilaterally, normal work of breathing GI: soft, nontender, nondistended, + BS MS: no deformity or atrophy    Skin: warm and dry, no rash Neuro:  Alert and Oriented x 3, Strength and sensation are intact Psych: euthymic mood, full affect  Wt Readings from Last 3 Encounters:  10/15/16 285 lb 6.4 oz (129.5 kg)  09/18/16 279 lb 15.8 oz (127 kg)  08/29/16 280 lb 9.6 oz (127.3 kg)      Studies/Labs Reviewed:   EKG:  EKG  No new data  Recent Labs: 08/21/2016: TSH 3.06 09/18/2016: BUN 13; Creatinine, Ser 1.39; Hemoglobin 13.3; Platelets 144; Potassium 3.5; Sodium 140  Lipid Panel No results found for: CHOL, TRIG, HDL, CHOLHDL, VLDL, LDLCALC, LDLDIRECT  Additional studies/ records that were reviewed today include:  Stress Myoview November 2017: Study Highlights     Nuclear stress EF: 56%.  There was no ST segment deviation noted during stress.  This is a low risk study.  The left ventricular ejection fraction is normal (55-65%).   1. EF 58%, normal wall motion.  2. Fixed, medium-sized, mild basal inferior/inferolateral and mid inferior perfusion defect.  Cannot fully rule out prior infarction, but given normal wall motion, soft tissue attenuation may be more likely.  No ischemia.    Low risk study.       ASSESSMENT:    1. PAD (peripheral artery disease) ([place])   2. Coronary artery disease involving native coronary artery of native heart without angina pectoris   3. Hypertensive heart disease without heart failure   4. Claudication in peripheral vascular disease ([place])   5. OSA (obstructive sleep apnea)   6. 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 Improved right lower extremity but still with claudication and left. 2. Low risk myocardial perfusion study. No further evaluation at this time. 3. Blood pressure is under reasonable control. Low salt diet and weight loss or advocated. 4. Advocated compliance with C Pap. 5. Being followed by nephrology.    Medication Adjustments/Labs and Tests Ordered: Current  medicines are reviewed at length with the patient today.  Concerns regarding medicines are outlined above.  Medication changes, Labs and Tests ordered today are listed in the Patient Instructions below. There are no Patient Instructions on file for this visit.   Signed, Sinclair Grooms, MD  10/15/2016 2:02 PM    [facility] [address], [place], [place]  [address] Phone: [phone]; Fax: [phone]

## 2016-10-15 NOTE — Patient Instructions (Signed)
Medication Instructions:  None  Labwork: None  Testing/Procedures: None  Follow-Up: Your physician wants you to follow-up in: 6-8 months with Dr. Smith.  You will receive a reminder letter in the mail two months in advance. If you don't receive a letter, please call our office to schedule the follow-up appointment.   Any Other Special Instructions Will Be Listed Below (If Applicable).     If you need a refill on your cardiac medications before your next appointment, please call your pharmacy.   

## 2016-10-17 ENCOUNTER — Encounter: Payer: Self-pay | Admitting: Cardiovascular Disease

## 2016-10-17 ENCOUNTER — Ambulatory Visit (INDEPENDENT_AMBULATORY_CARE_PROVIDER_SITE_OTHER): Payer: Medicare Other | Admitting: Cardiovascular Disease

## 2016-10-17 DIAGNOSIS — I739 Peripheral vascular disease, unspecified: Secondary | ICD-10-CM

## 2016-10-17 NOTE — Progress Notes (Signed)
10/17/2016 [name]   24-May-1946  [id number]  Primary Physician Jani Gravel, MD Primary Cardiologist: Lorretta Harp MD Renae Gloss  HPI:  Mr. Roblyer is a very pleasant 70 year old severely overweight married African-American male father of 37, grandfather 3 grandchildren referred to me by Dr. Pernell Dupre for peripheral vascular evaluation. I last saw him in the office 08/29/16 . He has history of CAD status post chronic catheterization in 2003. He has a remote history of tobacco abuse, treated hypertension, hypokalemia and diabetes. Lifestyle limiting claudication left greater than right for last 3 years. Dopplers performed 03/29/15 revealed a right ABI 0.79 with a high-frequency signal in the distal right SFA and left ABI of 0.6 with an occluded distal left SFAbecause of a serum creatinine 1.6 the patient was admitted for hydration the day prior to angiography. His serum creatinine fell from 1.6-1.2. 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 He underwent diagnostic peripheral angiography by myself 08/26/16 revealing high-grade calcified right common femoral stenosis, bilateral SFA calcified stenoses three-vessel runoff. Because of renal insufficiency dimension was staged until 09/17/16 when I performed a diamondback orbital or his last atherectomy, drug-eluting balloon angioplasty on his right common femoral and superficial femoral arteries. His follow-up Dopplers performed on 10/02/16 revealed normal ABI on the right. His right lower; claudication has resolved and he wishes to proceed with left SFA intervention.  Current Outpatient Prescriptions  Medication Sig Dispense Refill  . acetaminophen (TYLENOL) 325 MG tablet Take 650 mg by mouth every 6 (six) hours as needed (for pain.).    Marland Kitchen aspirin EC 81 MG tablet Take 81 mg by mouth daily.    .  carvedilol (COREG) 25 MG tablet Take 1 tablet (25 mg total) by mouth 2 (two) times daily with a meal. 60 tablet 12  . cetirizine (ZYRTEC ALLERGY) 10 MG tablet Take 10 mg by mouth daily.    . chlorthalidone (HYGROTON) 50 MG tablet Take 25 mg by mouth every other day.    . cholecalciferol (VITAMIN D) 1000 UNITS tablet Take 1,000 Units by mouth daily.    . citalopram (CELEXA) 20 MG tablet Take 10 mg by mouth daily.    . clopidogrel (PLAVIX) 75 MG tablet Take 1 tablet (75 mg total) by mouth daily. 30 tablet 12  . HUMALOG KWIKPEN 100 UNIT/ML KiwkPen Inject 7-11 Units into the skin 2 (two) times daily. 7 units in the morning before breakfast, 11 units at lunch    . insulin glargine (LANTUS) 100 UNIT/ML injection Inject 49 Units into the skin at bedtime.     Marland Kitchen JARDIANCE 25 MG TABS tablet Take 25 mg by mouth daily.     Marland Kitchen l-methylfolate-B6-B12 (METANX) 3-35-2 MG TABS Take 1 tablet by mouth 2 (two) times daily.     Marland Kitchen losartan (COZAAR) 100 MG tablet Take 1 tablet (100 mg total) by mouth daily. 30 tablet 12  . montelukast (SINGULAIR) 10 MG tablet Take 10 mg by mouth at bedtime.    Marland Kitchen NIFEdipine (PROCARDIA-XL/ADALAT CC) 30 MG 24 hr tablet Take 30 mg by mouth daily.    . pantoprazole (PROTONIX) 20 MG tablet Take 20 mg by mouth daily.     . prazosin (MINIPRESS) 1 MG capsule Take 3 mg by mouth at bedtime.    . rosuvastatin (CRESTOR) 40 MG tablet Take 40 mg by mouth daily.    . sodium chloride (OCEAN)  0.65 % SOLN nasal spray Place 1 spray into both nostrils 3 (three) times daily as needed for congestion.     No current facility-administered medications for this visit.     Allergies  Allergen Reactions  . Atorvastatin Other (See Comments)    Muscle pain/weaknesses (Myalgia)  . Pravastatin Other (See Comments)    Muscle pain/weaknesses (Myalgia)  . Simvastatin Other (See Comments)    Muscle pain/weaknesses (Myalgia)  . Terazosin Other (See Comments)    Unknown reaction.    Social History   Social  History  . Marital status: Married    Spouse name: N/A  . Number of children: N/A  . Years of education: N/A   Occupational History  . Not on file.   Social History Main Topics  . Smoking status: Former Smoker    Packs/day: 3.00    Years: 13.00    Types: Cigarettes    Quit date: 33  . Smokeless tobacco: Never Used  . Alcohol use 0.0 oz/week     Comment: 09/17/2016 "quit years ago"  . Drug use: No  . Sexual activity: Yes    Birth control/ protection: Condom   Other Topics Concern  . Not on file   Social History Narrative   No caffeine.     Review of Systems: General: negative for chills, fever, night sweats or weight changes.  Cardiovascular: negative for chest pain, dyspnea on exertion, edema, orthopnea, palpitations, paroxysmal nocturnal dyspnea or shortness of breath Dermatological: negative for rash Respiratory: negative for cough or wheezing Urologic: negative for hematuria Abdominal: negative for nausea, vomiting, diarrhea, bright red blood per rectum, melena, or hematemesis Neurologic: negative for visual changes, syncope, or dizziness All other systems reviewed and are otherwise negative except as noted above.    Blood pressure (!) 155/85, pulse 84, height 5\' 8"  (1.727 m), weight 285 lb (129.3 kg), SpO2 91 %.  General appearance: alert and no distress Neck: no adenopathy, no carotid bruit, no JVD, supple, symmetrical, trachea midline and thyroid not enlarged, symmetric, no tenderness/mass/nodules Lungs: clear to auscultation bilaterally Heart: regular rate and rhythm, S1, S2 normal, no murmur, click, rub or gallop Extremities: extremities normal, atraumatic, no cyanosis or edema  EKG not performed today  ASSESSMENT AND PLAN:   PAD (peripheral artery disease) ([place]) Mr. Vanhaitsma returns today for follow-up of PAD. He is referred to me by Dr. Tamala Julian for claudication. His original angina and performed 08/27/16 showed calcified right common femoral and SFA disease  as well as left SFA disease. His procedure was staged because of moderate renal insufficiency. I performed time of orbital or facial atherectomy, drug-eluting balloon angioplasty on 08/17/16 with excellent angiographic result. His follow-up Dopplers revealed normal ABIs on the right side has claudication resolved. He has residual disease on the left which he wishes to pursue intervention in the near future.      Lorretta Harp MD FACP,FACC,FAHA, [facility] 10/17/2016 11:13 AM

## 2016-10-17 NOTE — Patient Instructions (Signed)
Medication Instructions: Your physician recommends that you continue on your current medications as directed. Please refer to the Current Medication list given to you today.  Testing/Procedures: Dr. Gwenlyn Found has ordered a peripheral angiogram, Left SFA to be done at [facility].  This procedure is going to look at the bloodflow in your lower extremities.  If Dr. Gwenlyn Found is able to open up the arteries, you will have to spend one night in the hospital.  If he is not able to open the arteries, you will be able to go home that same day.    After the procedure, you will not be allowed to drive for 3 days or push, pull, or lift anything greater than 10 lbs for one week.    You will be required to have the following tests prior to the procedure:  1. Blood work-the blood work can be done no more than 14 days prior to the procedure.  It can be done at any [facility] lab.  There is one downstairs on the first floor of this building and one in the [facility] building [id number] [address], [address])     *REPS:  ERIC--Diamondback  Puncture site:  RIGHT GROIN

## 2016-10-17 NOTE — Assessment & Plan Note (Signed)
Joel Rivera returns today for follow-up of PAD. He is referred to me by Dr. Tamala Julian for claudication. His original angina and performed 08/27/16 showed calcified right common femoral and SFA disease as well as left SFA disease. His procedure was staged because of moderate renal insufficiency. I performed time of orbital or facial atherectomy, drug-eluting balloon angioplasty on 08/17/16 with excellent angiographic result. His follow-up Dopplers revealed normal ABIs on the right side has claudication resolved. He has residual disease on the left which he wishes to pursue intervention in the near future.

## 2016-10-18 ENCOUNTER — Other Ambulatory Visit: Payer: Self-pay | Admitting: Cardiovascular Disease

## 2016-10-18 DIAGNOSIS — I739 Peripheral vascular disease, unspecified: Secondary | ICD-10-CM

## 2016-10-22 DIAGNOSIS — E785 Hyperlipidemia, unspecified: Secondary | ICD-10-CM | POA: Diagnosis not present

## 2016-10-22 DIAGNOSIS — I739 Peripheral vascular disease, unspecified: Secondary | ICD-10-CM | POA: Diagnosis not present

## 2016-10-22 LAB — CBC WITH DIFFERENTIAL/PLATELET
Basophils Absolute: 0 cells/uL (ref 0–200)
Basophils Relative: 0 %
Eosinophils Absolute: 67 cells/uL (ref 15–500)
Eosinophils Relative: 1 %
HCT: 42.8 % (ref 38.5–50.0)
Hemoglobin: 13.9 g/dL (ref 13.2–17.1)
Lymphocytes Relative: 37 %
Lymphs Abs: 2479 cells/uL (ref 850–3900)
MCH: 29.6 pg (ref 27.0–33.0)
MCHC: 32.5 g/dL (ref 32.0–36.0)
MCV: 91.3 fL (ref 80.0–100.0)
MPV: 11.1 fL (ref 7.5–12.5)
Monocytes Absolute: 670 cells/uL (ref 200–950)
Monocytes Relative: 10 %
Neutro Abs: 3484 cells/uL (ref 1500–7800)
Neutrophils Relative %: 52 %
Platelets: 152 10*3/uL (ref 140–400)
RBC: 4.69 MIL/uL (ref 4.20–5.80)
RDW: 13.5 % (ref 11.0–15.0)
WBC: 6.7 10*3/uL (ref 3.8–10.8)

## 2016-10-23 LAB — BASIC METABOLIC PANEL WITH GFR
BUN: 20 mg/dL (ref 7–25)
CO2: 30 mmol/L (ref 20–31)
Calcium: 9.3 mg/dL (ref 8.6–10.3)
Chloride: 106 mmol/L (ref 98–110)
Creat: 1.44 mg/dL — ABNORMAL HIGH (ref 0.70–1.18)
GFR, Est African American: 56 mL/min — ABNORMAL LOW (ref >=60)
GFR, Est Non African American: 48 mL/min — ABNORMAL LOW (ref >=60)
Glucose, Bld: 102 mg/dL — ABNORMAL HIGH (ref 65–99)
Potassium: 4.3 mmol/L (ref 3.5–5.3)
Sodium: 140 mmol/L (ref 135–146)

## 2016-10-23 LAB — PROTIME-INR
INR: 1
Prothrombin Time: 10.6 s (ref 9.0–11.5)

## 2016-10-23 LAB — APTT: aPTT: 26 s (ref 22–34)

## 2016-10-24 ENCOUNTER — Telehealth: Payer: Self-pay | Admitting: Cardiovascular Disease

## 2016-10-24 NOTE — Telephone Encounter (Signed)
SPOKE TO WIFE . SHE WANTED TO MAKE SURE THE LETTER RECEIVED WAS CORRECT FOR TIME.  PATIENT IS TO ARRIVE AT 5:30 AM  TOMORROW . CONFIRMED. VERBALIZED UNDERSTANDING.

## 2016-10-24 NOTE — Telephone Encounter (Signed)
New message  Pt is calling to verify what time pt needs to be at hospital prior to cath/surgery scheduled at 7:30  Please call back and advise

## 2016-10-25 ENCOUNTER — Ambulatory Visit (HOSPITAL_COMMUNITY)
Admission: RE | Admit: 2016-10-25 | Discharge: 2016-10-26 | Disposition: A | Discharge status: Home / Self Care | Payer: Medicare Other | Source: Ambulatory Visit | Attending: Cardiovascular Disease | Admitting: Cardiovascular Disease

## 2016-10-25 ENCOUNTER — Encounter (HOSPITAL_COMMUNITY)
Admission: RE | Disposition: A | Discharge status: Home / Self Care | Payer: Self-pay | Source: Ambulatory Visit | Attending: Cardiovascular Disease

## 2016-10-25 ENCOUNTER — Encounter (HOSPITAL_COMMUNITY): Payer: Self-pay | Admitting: Cardiovascular Disease

## 2016-10-25 DIAGNOSIS — I1310 Hypertensive heart and chronic kidney disease without heart failure, with stage 1 through stage 4 chronic kidney disease, or unspecified chronic kidney disease: Secondary | ICD-10-CM | POA: Insufficient documentation

## 2016-10-25 DIAGNOSIS — G4733 Obstructive sleep apnea (adult) (pediatric): Secondary | ICD-10-CM | POA: Diagnosis not present

## 2016-10-25 DIAGNOSIS — I739 Peripheral vascular disease, unspecified: Secondary | ICD-10-CM | POA: Diagnosis present

## 2016-10-25 DIAGNOSIS — Z6841 Body Mass Index (BMI) 40.0 and over, adult: Secondary | ICD-10-CM | POA: Insufficient documentation

## 2016-10-25 DIAGNOSIS — I70212 Atherosclerosis of native arteries of extremities with intermittent claudication, left leg: Secondary | ICD-10-CM | POA: Diagnosis not present

## 2016-10-25 DIAGNOSIS — E785 Hyperlipidemia, unspecified: Secondary | ICD-10-CM | POA: Diagnosis not present

## 2016-10-25 DIAGNOSIS — E1051 Type 1 diabetes mellitus with diabetic peripheral angiopathy without gangrene: Secondary | ICD-10-CM | POA: Insufficient documentation

## 2016-10-25 DIAGNOSIS — Z7982 Long term (current) use of aspirin: Secondary | ICD-10-CM | POA: Insufficient documentation

## 2016-10-25 DIAGNOSIS — N183 Chronic kidney disease, stage 3 unspecified: Secondary | ICD-10-CM | POA: Diagnosis present

## 2016-10-25 DIAGNOSIS — E876 Hypokalemia: Secondary | ICD-10-CM | POA: Diagnosis not present

## 2016-10-25 DIAGNOSIS — Z7902 Long term (current) use of antithrombotics/antiplatelets: Secondary | ICD-10-CM | POA: Diagnosis not present

## 2016-10-25 DIAGNOSIS — Z794 Long term (current) use of insulin: Secondary | ICD-10-CM | POA: Insufficient documentation

## 2016-10-25 DIAGNOSIS — I10 Essential (primary) hypertension: Secondary | ICD-10-CM | POA: Diagnosis present

## 2016-10-25 DIAGNOSIS — E1022 Type 1 diabetes mellitus with diabetic chronic kidney disease: Secondary | ICD-10-CM | POA: Diagnosis not present

## 2016-10-25 DIAGNOSIS — Z87891 Personal history of nicotine dependence: Secondary | ICD-10-CM | POA: Insufficient documentation

## 2016-10-25 DIAGNOSIS — I2510 Atherosclerotic heart disease of native coronary artery without angina pectoris: Secondary | ICD-10-CM | POA: Diagnosis not present

## 2016-10-25 DIAGNOSIS — I119 Hypertensive heart disease without heart failure: Secondary | ICD-10-CM | POA: Diagnosis present

## 2016-10-25 HISTORY — DX: Disorder of arteries and arterioles, unspecified: I77.9

## 2016-10-25 HISTORY — DX: Morbid (severe) obesity due to excess calories: E66.01

## 2016-10-25 HISTORY — DX: Unspecified osteoarthritis, unspecified site: M19.90

## 2016-10-25 HISTORY — DX: Depression, unspecified: F32.A

## 2016-10-25 HISTORY — DX: Major depressive disorder, single episode, unspecified: F32.9

## 2016-10-25 HISTORY — DX: Peripheral vascular disease, unspecified: I73.9

## 2016-10-25 HISTORY — DX: Chronic kidney disease, stage 3 unspecified: N18.30

## 2016-10-25 HISTORY — DX: Chronic sinusitis, unspecified: J32.9

## 2016-10-25 HISTORY — DX: Chronic kidney disease, stage 3 (moderate): N18.3

## 2016-10-25 HISTORY — DX: Personal history of nicotine dependence: Z87.891

## 2016-10-25 HISTORY — PX: PERIPHERAL VASCULAR CATHETERIZATION: SHX172C

## 2016-10-25 HISTORY — DX: Type 2 diabetes mellitus with diabetic neuropathy, unspecified: E11.40

## 2016-10-25 LAB — GLUCOSE, CAPILLARY
Glucose-Capillary: 122 mg/dL — ABNORMAL HIGH (ref 65–99)
Glucose-Capillary: 127 mg/dL — ABNORMAL HIGH (ref 65–99)
Glucose-Capillary: 209 mg/dL — ABNORMAL HIGH (ref 65–99)
Glucose-Capillary: 144 mg/dL — ABNORMAL HIGH (ref 65–99)
Glucose-Capillary: 124 mg/dL — ABNORMAL HIGH (ref 65–99)

## 2016-10-25 LAB — POCT ACTIVATED CLOTTING TIME
Activated Clotting Time: 235 seconds
Activated Clotting Time: 257 seconds
Activated Clotting Time: 219 seconds
Activated Clotting Time: 147 seconds

## 2016-10-25 SURGERY — PERIPHERAL VASCULAR ATHERECTOMY

## 2016-10-25 MED ORDER — ROSUVASTATIN CALCIUM 20 MG PO TABS: 40.0000 mg | ORAL_TABLET | Freq: Every day | ORAL | Status: DC

## 2016-10-25 MED ORDER — FENTANYL CITRATE (PF) 100 MCG/2ML IJ SOLN
INTRAMUSCULAR | Status: AC
  Filled 2016-10-25: qty 2

## 2016-10-25 MED ORDER — SODIUM CHLORIDE 0.9 % WEIGHT BASED INFUSION: 1.0000 mL/kg/h | INTRAVENOUS | Status: DC

## 2016-10-25 MED ORDER — HYDRALAZINE HCL 20 MG/ML IJ SOLN
10.0000 mg | INTRAMUSCULAR | Status: DC | PRN
  Administered 2016-10-25: 12:00:00 10 mg via INTRAVENOUS
  Filled 2016-10-25: qty 1

## 2016-10-25 MED ORDER — PRAZOSIN HCL 2 MG PO CAPS
3.0000 mg | ORAL_CAPSULE | Freq: Every day | ORAL | Status: DC
  Filled 2016-10-25: qty 1

## 2016-10-25 MED ORDER — HEPARIN SODIUM (PORCINE) 1000 UNIT/ML IJ SOLN
INTRAMUSCULAR | Status: AC
  Filled 2016-10-25: qty 1

## 2016-10-25 MED ORDER — NITROGLYCERIN 1 MG/10 ML FOR IR/CATH LAB
INTRA_ARTERIAL | Status: DC | PRN
  Administered 2016-10-25 (×2): 200 ug via INTRA_ARTERIAL

## 2016-10-25 MED ORDER — INSULIN ASPART 100 UNIT/ML ~~LOC~~ SOLN: 7.0000 [IU] | Freq: Every day | SUBCUTANEOUS | Status: DC

## 2016-10-25 MED ORDER — CLOPIDOGREL BISULFATE 75 MG PO TABS
75.0000 mg | ORAL_TABLET | Freq: Every day | ORAL | Status: DC
  Administered 2016-10-26: 10:00:00 75 mg via ORAL
  Filled 2016-10-25: qty 1

## 2016-10-25 MED ORDER — HEPARIN (PORCINE) IN NACL 2-0.9 UNIT/ML-% IJ SOLN
INTRAMUSCULAR | Status: AC
  Filled 2016-10-25: qty 1000

## 2016-10-25 MED ORDER — PANTOPRAZOLE SODIUM 20 MG PO TBEC
20.0000 mg | DELAYED_RELEASE_TABLET | Freq: Every day | ORAL | Status: DC
  Administered 2016-10-26: 10:00:00 20 mg via ORAL
  Filled 2016-10-25: qty 1

## 2016-10-25 MED ORDER — ASPIRIN 81 MG PO CHEW: 81.0000 mg | CHEWABLE_TABLET | ORAL | Status: DC

## 2016-10-25 MED ORDER — NITROGLYCERIN IN D5W 200-5 MCG/ML-% IV SOLN
INTRAVENOUS | Status: AC
  Filled 2016-10-25: qty 250

## 2016-10-25 MED ORDER — IODIXANOL 320 MG/ML IV SOLN
INTRAVENOUS | Status: DC | PRN
  Administered 2016-10-25: 120 mL via INTRAVENOUS

## 2016-10-25 MED ORDER — INSULIN ASPART 100 UNIT/ML ~~LOC~~ SOLN: 0.0000 [IU] | Freq: Every day | SUBCUTANEOUS | Status: DC

## 2016-10-25 MED ORDER — ASPIRIN EC 81 MG PO TBEC: 81.0000 mg | DELAYED_RELEASE_TABLET | Freq: Every day | ORAL | Status: DC

## 2016-10-25 MED ORDER — MIDAZOLAM HCL 2 MG/2ML IJ SOLN
INTRAMUSCULAR | Status: AC
  Filled 2016-10-25: qty 2

## 2016-10-25 MED ORDER — CLOPIDOGREL BISULFATE 75 MG PO TABS: 75.0000 mg | ORAL_TABLET | Freq: Every day | ORAL | Status: DC

## 2016-10-25 MED ORDER — SODIUM CHLORIDE 0.9 % WEIGHT BASED INFUSION
3.0000 mL/kg/h | INTRAVENOUS | Status: DC
  Administered 2016-10-25: 3 mL/kg/h via INTRAVENOUS

## 2016-10-25 MED ORDER — INSULIN ASPART 100 UNIT/ML ~~LOC~~ SOLN: 11.0000 [IU] | Freq: Every day | SUBCUTANEOUS | Status: DC

## 2016-10-25 MED ORDER — SALINE SPRAY 0.65 % NA SOLN: 1.0000 | Freq: Three times a day (TID) | NASAL | Status: DC | PRN

## 2016-10-25 MED ORDER — GUAIFENESIN ER 600 MG PO TB12: 600.0000 mg | ORAL_TABLET | Freq: Every day | ORAL | Status: DC | PRN

## 2016-10-25 MED ORDER — ASPIRIN EC 81 MG PO TBEC
81.0000 mg | DELAYED_RELEASE_TABLET | Freq: Every day | ORAL | Status: DC
  Administered 2016-10-26: 10:00:00 81 mg via ORAL
  Filled 2016-10-25 (×2): qty 1

## 2016-10-25 MED ORDER — MORPHINE SULFATE (PF) 2 MG/ML IV SOLN: 2.0000 mg | INTRAVENOUS | Status: DC | PRN

## 2016-10-25 MED ORDER — CHLORTHALIDONE 25 MG PO TABS: 25.0000 mg | ORAL_TABLET | ORAL | Status: DC

## 2016-10-25 MED ORDER — VITAMIN D 1000 UNITS PO TABS
1000.0000 [IU] | ORAL_TABLET | Freq: Every day | ORAL | Status: DC
  Administered 2016-10-26: 1000 [IU] via ORAL
  Filled 2016-10-25: qty 1

## 2016-10-25 MED ORDER — ACETAMINOPHEN 325 MG PO TABS: 650.0000 mg | ORAL_TABLET | ORAL | Status: DC | PRN

## 2016-10-25 MED ORDER — LIDOCAINE HCL (PF) 1 % IJ SOLN
INTRAMUSCULAR | Status: DC | PRN
  Administered 2016-10-25: 28 mL

## 2016-10-25 MED ORDER — SODIUM CHLORIDE 0.9% FLUSH: 3.0000 mL | INTRAVENOUS | Status: DC | PRN

## 2016-10-25 MED ORDER — HEPARIN SODIUM (PORCINE) 1000 UNIT/ML IJ SOLN
INTRAMUSCULAR | Status: DC | PRN
  Administered 2016-10-25: 2500 [IU] via INTRAVENOUS
  Administered 2016-10-25: 8000 [IU] via INTRAVENOUS

## 2016-10-25 MED ORDER — LIDOCAINE HCL (PF) 1 % IJ SOLN
INTRAMUSCULAR | Status: AC
Start: 2016-10-25 — End: 2016-10-25
  Filled 2016-10-25: qty 30

## 2016-10-25 MED ORDER — VIPERSLIDE LUBRICANT OPTIME
TOPICAL | Status: DC | PRN
  Administered 2016-10-25: 08:00:00 via SURGICAL_CAVITY

## 2016-10-25 MED ORDER — POLYETHYLENE GLYCOL 3350 17 G PO PACK
17.0000 g | PACK | Freq: Every day | ORAL | Status: DC | PRN
  Administered 2016-10-26: 17 g via ORAL
  Filled 2016-10-25: qty 1

## 2016-10-25 MED ORDER — CITALOPRAM HYDROBROMIDE 20 MG PO TABS
10.0000 mg | ORAL_TABLET | Freq: Every day | ORAL | Status: DC
  Administered 2016-10-26: 10 mg via ORAL
  Filled 2016-10-25: qty 1

## 2016-10-25 MED ORDER — INSULIN ASPART 100 UNIT/ML ~~LOC~~ SOLN: 0.0000 [IU] | Freq: Three times a day (TID) | SUBCUTANEOUS | Status: DC

## 2016-10-25 MED ORDER — LOSARTAN POTASSIUM 50 MG PO TABS
100.0000 mg | ORAL_TABLET | Freq: Every day | ORAL | Status: DC
  Administered 2016-10-26: 10:00:00 100 mg via ORAL
  Filled 2016-10-25: qty 2

## 2016-10-25 MED ORDER — MONTELUKAST SODIUM 10 MG PO TABS
10.0000 mg | ORAL_TABLET | Freq: Every day | ORAL | Status: DC
  Filled 2016-10-25: qty 1

## 2016-10-25 MED ORDER — L-METHYLFOLATE-B6-B12 3-35-2 MG PO TABS
1.0000 | ORAL_TABLET | Freq: Two times a day (BID) | ORAL | Status: DC
  Administered 2016-10-26: 1 via ORAL
  Filled 2016-10-25 (×3): qty 1

## 2016-10-25 MED ORDER — INSULIN LISPRO 100 UNIT/ML (KWIKPEN): 7.0000 [IU] | PEN_INJECTOR | Freq: Two times a day (BID) | SUBCUTANEOUS | Status: DC

## 2016-10-25 MED ORDER — ONDANSETRON HCL 4 MG/2ML IJ SOLN: 4.0000 mg | Freq: Four times a day (QID) | INTRAMUSCULAR | Status: DC | PRN

## 2016-10-25 MED ORDER — SODIUM CHLORIDE 0.45 % IV SOLN
INTRAVENOUS | Status: AC
  Administered 2016-10-25: 10:00:00 via INTRAVENOUS

## 2016-10-25 MED ORDER — ANGIOPLASTY BOOK
Freq: Once | Status: AC
  Administered 2016-10-25: 20:00:00
  Filled 2016-10-25: qty 1

## 2016-10-25 MED ORDER — NIFEDIPINE ER OSMOTIC RELEASE 30 MG PO TB24
30.0000 mg | ORAL_TABLET | Freq: Every day | ORAL | Status: DC
  Administered 2016-10-26: 30 mg via ORAL
  Filled 2016-10-25: qty 1

## 2016-10-25 MED ORDER — CARVEDILOL 12.5 MG PO TABS
25.0000 mg | ORAL_TABLET | Freq: Two times a day (BID) | ORAL | Status: DC
  Administered 2016-10-25 – 2016-10-26 (×2): 25 mg via ORAL
  Filled 2016-10-25 (×2): qty 2

## 2016-10-25 MED ORDER — HEPARIN (PORCINE) IN NACL 2-0.9 UNIT/ML-% IJ SOLN
INTRAMUSCULAR | Status: DC | PRN
  Administered 2016-10-25: 1000 mL

## 2016-10-25 MED ORDER — VERAPAMIL HCL 2.5 MG/ML IV SOLN
INTRAVENOUS | Status: AC
  Filled 2016-10-25: qty 2

## 2016-10-25 MED ORDER — ACETAMINOPHEN 325 MG PO TABS: 650.0000 mg | ORAL_TABLET | Freq: Four times a day (QID) | ORAL | Status: DC | PRN

## 2016-10-25 MED ORDER — INSULIN GLARGINE 100 UNIT/ML ~~LOC~~ SOLN
49.0000 [IU] | Freq: Every day | SUBCUTANEOUS | Status: DC
  Administered 2016-10-25: 20:00:00 49 [IU] via SUBCUTANEOUS
  Filled 2016-10-25 (×2): qty 0.49

## 2016-10-25 SURGICAL SUPPLY — 27 items
BALLN ARMADA 4.0X120X150 (BALLOONS) ×3
BALLN IN.PACT DCB 6X120 (BALLOONS) ×3
BALLOON ARMADA 4.0X120X150 (BALLOONS) ×2 IMPLANT
CATH QUICKCROSS .018X135CM (MICROCATHETER) ×3 IMPLANT
CATH TEMPO 5F RIM 65CM (CATHETERS) ×3 IMPLANT
DCB IN.PACT 6X120 (BALLOONS) ×2 IMPLANT
DEVICE CONTINUOUS FLUSH (MISCELLANEOUS) ×3 IMPLANT
DEVICE EMBOSHIELD NAV6 4.0-7.0 (WIRE) ×3 IMPLANT
DIAMONDBACK SOLID OAS 2.0MM (CATHETERS) ×3
GUIDEWIRE ANGLED .035X150CM (WIRE) ×3 IMPLANT
KIT ENCORE 26 ADVANTAGE (KITS) ×3 IMPLANT
KIT PV (KITS) ×3 IMPLANT
LUBRICANT VIPERSLIDE CORONARY (MISCELLANEOUS) ×3 IMPLANT
SHEATH PINNACLE 5F 10CM (SHEATH) ×3 IMPLANT
SHEATH PINNACLE 7F 10CM (SHEATH) ×3 IMPLANT
SHEATH PINNACLE MP 7F 45CM (SHEATH) ×3 IMPLANT
STOPCOCK MORSE 400PSI 3WAY (MISCELLANEOUS) ×3 IMPLANT
SYRINGE MEDRAD AVANTA MACH 7 (SYRINGE) ×3 IMPLANT
SYSTEM DIMNDBCK SLD OAS 2.0MM (CATHETERS) ×2 IMPLANT
TAPE RADIOPAQUE TURBO (MISCELLANEOUS) ×3 IMPLANT
TRANSDUCER W/STOPCOCK (MISCELLANEOUS) ×3 IMPLANT
TRAY PV CATH (CUSTOM PROCEDURE TRAY) ×3 IMPLANT
TUBING CIL FLEX 10 FLL-RA (TUBING) ×3 IMPLANT
WIRE HITORQ VERSACORE ST 145CM (WIRE) ×3 IMPLANT
WIRE ROSEN-J .035X180CM (WIRE) ×3 IMPLANT
WIRE SPARTACORE .014X300CM (WIRE) ×3 IMPLANT
WIRE VIPER ADVANCE .017X335CM (WIRE) ×3 IMPLANT

## 2016-10-25 NOTE — Interval H&P Note (Signed)
History and Physical Interval Note:  10/25/2016 7:46 AM  Joel Rivera.  has presented today for surgery, with the diagnosis of pad  The various methods of treatment have been discussed with the patient and family. After consideration of risks, benefits and other options for treatment, the patient has consented to  Procedure(s): Lower Extremity Angiography (N/A) as a surgical intervention .  The patient's history has been reviewed, patient examined, no change in status, stable for surgery.  I have reviewed the patient's chart and labs.  Questions were answered to the patient's satisfaction.     [name]

## 2016-10-25 NOTE — Progress Notes (Signed)
Hourly rounding done. Pt needed assistance with set up breakfast tray. Tech assisted Pt with task.

## 2016-10-25 NOTE — Care Management Note (Signed)
Case Management Note  Patient Details  Name: Joel Rivera. MRN: [id number] Date of Birth: [birth year]  Subjective/Objective:  S/p pv intervention, patient already on home meds of plavix and asa.  NCM will cont to follow for dc needs.                    Action/Plan:   Expected Discharge Date:                  Expected Discharge Plan:  Home/Self Care  In-House Referral:     Discharge planning Services  CM Consult  Post Acute Care Choice:    Choice offered to:     DME Arranged:    DME Agency:     HH Arranged:    HH Agency:     Status of Service:  Completed, signed off  If discussed at [employer] of Stay Meetings, dates discussed:    Additional Comments:  Zenon Mayo, RN 10/25/2016, 11:32 AM

## 2016-10-25 NOTE — H&P (View-Only) (Signed)
10/17/2016 [name]   10-16-1946  [id number]  Primary Physician Jani Gravel, MD Primary Cardiologist: Lorretta Harp MD Renae Gloss  HPI:  Mr. Hamernik is a very pleasant 70 year old severely overweight married African-American male father of 71, grandfather 3 grandchildren referred to me by Dr. Pernell Dupre for peripheral vascular evaluation. I last saw him in the office 08/29/16 . He has history of CAD status post chronic catheterization in 2003. He has a remote history of tobacco abuse, treated hypertension, hypokalemia and diabetes. Lifestyle limiting claudication left greater than right for last 3 years. Dopplers performed 03/29/15 revealed a right ABI 0.79 with a high-frequency signal in the distal right SFA and left ABI of 0.6 with an occluded distal left SFAbecause of a serum creatinine 1.6 the patient was admitted for hydration the day prior to angiography. His serum creatinine fell from 1.6-1.2. 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 He underwent diagnostic peripheral angiography by myself 08/26/16 revealing high-grade calcified right common femoral stenosis, bilateral SFA calcified stenoses three-vessel runoff. Because of renal insufficiency dimension was staged until 09/17/16 when I performed a diamondback orbital or his last atherectomy, drug-eluting balloon angioplasty on his right common femoral and superficial femoral arteries. His follow-up Dopplers performed on 10/02/16 revealed normal ABI on the right. His right lower; claudication has resolved and he wishes to proceed with left SFA intervention.  Current Outpatient Prescriptions  Medication Sig Dispense Refill  . acetaminophen (TYLENOL) 325 MG tablet Take 650 mg by mouth every 6 (six) hours as needed (for pain.).    Marland Kitchen aspirin EC 81 MG tablet Take 81 mg by mouth daily.    .  carvedilol (COREG) 25 MG tablet Take 1 tablet (25 mg total) by mouth 2 (two) times daily with a meal. 60 tablet 12  . cetirizine (ZYRTEC ALLERGY) 10 MG tablet Take 10 mg by mouth daily.    . chlorthalidone (HYGROTON) 50 MG tablet Take 25 mg by mouth every other day.    . cholecalciferol (VITAMIN D) 1000 UNITS tablet Take 1,000 Units by mouth daily.    . citalopram (CELEXA) 20 MG tablet Take 10 mg by mouth daily.    . clopidogrel (PLAVIX) 75 MG tablet Take 1 tablet (75 mg total) by mouth daily. 30 tablet 12  . HUMALOG KWIKPEN 100 UNIT/ML KiwkPen Inject 7-11 Units into the skin 2 (two) times daily. 7 units in the morning before breakfast, 11 units at lunch    . insulin glargine (LANTUS) 100 UNIT/ML injection Inject 49 Units into the skin at bedtime.     Marland Kitchen JARDIANCE 25 MG TABS tablet Take 25 mg by mouth daily.     Marland Kitchen l-methylfolate-B6-B12 (METANX) 3-35-2 MG TABS Take 1 tablet by mouth 2 (two) times daily.     Marland Kitchen losartan (COZAAR) 100 MG tablet Take 1 tablet (100 mg total) by mouth daily. 30 tablet 12  . montelukast (SINGULAIR) 10 MG tablet Take 10 mg by mouth at bedtime.    Marland Kitchen NIFEdipine (PROCARDIA-XL/ADALAT CC) 30 MG 24 hr tablet Take 30 mg by mouth daily.    . pantoprazole (PROTONIX) 20 MG tablet Take 20 mg by mouth daily.     . prazosin (MINIPRESS) 1 MG capsule Take 3 mg by mouth at bedtime.    . rosuvastatin (CRESTOR) 40 MG tablet Take 40 mg by mouth daily.    . sodium chloride (OCEAN)  0.65 % SOLN nasal spray Place 1 spray into both nostrils 3 (three) times daily as needed for congestion.     No current facility-administered medications for this visit.     Allergies  Allergen Reactions  . Atorvastatin Other (See Comments)    Muscle pain/weaknesses (Myalgia)  . Pravastatin Other (See Comments)    Muscle pain/weaknesses (Myalgia)  . Simvastatin Other (See Comments)    Muscle pain/weaknesses (Myalgia)  . Terazosin Other (See Comments)    Unknown reaction.    Social History   Social  History  . Marital status: Married    Spouse name: N/A  . Number of children: N/A  . Years of education: N/A   Occupational History  . Not on file.   Social History Main Topics  . Smoking status: Former Smoker    Packs/day: 3.00    Years: 13.00    Types: Cigarettes    Quit date: 37  . Smokeless tobacco: Never Used  . Alcohol use 0.0 oz/week     Comment: 09/17/2016 "quit years ago"  . Drug use: No  . Sexual activity: Yes    Birth control/ protection: Condom   Other Topics Concern  . Not on file   Social History Narrative   No caffeine.     Review of Systems: General: negative for chills, fever, night sweats or weight changes.  Cardiovascular: negative for chest pain, dyspnea on exertion, edema, orthopnea, palpitations, paroxysmal nocturnal dyspnea or shortness of breath Dermatological: negative for rash Respiratory: negative for cough or wheezing Urologic: negative for hematuria Abdominal: negative for nausea, vomiting, diarrhea, bright red blood per rectum, melena, or hematemesis Neurologic: negative for visual changes, syncope, or dizziness All other systems reviewed and are otherwise negative except as noted above.    Blood pressure (!) 155/85, pulse 84, height 5\' 8"  (1.727 m), weight 285 lb (129.3 kg), SpO2 91 %.  General appearance: alert and no distress Neck: no adenopathy, no carotid bruit, no JVD, supple, symmetrical, trachea midline and thyroid not enlarged, symmetric, no tenderness/mass/nodules Lungs: clear to auscultation bilaterally Heart: regular rate and rhythm, S1, S2 normal, no murmur, click, rub or gallop Extremities: extremities normal, atraumatic, no cyanosis or edema  EKG not performed today  ASSESSMENT AND PLAN:   PAD (peripheral artery disease) ([place]) Mr. Sylvain returns today for follow-up of PAD. He is referred to me by Dr. Tamala Julian for claudication. His original angina and performed 08/27/16 showed calcified right common femoral and SFA disease  as well as left SFA disease. His procedure was staged because of moderate renal insufficiency. I performed time of orbital or facial atherectomy, drug-eluting balloon angioplasty on 08/17/16 with excellent angiographic result. His follow-up Dopplers revealed normal ABIs on the right side has claudication resolved. He has residual disease on the left which he wishes to pursue intervention in the near future.      Lorretta Harp MD FACP,FACC,FAHA, [facility] 10/17/2016 11:13 AM

## 2016-10-25 NOTE — Progress Notes (Signed)
Site area: right groin  Site Prior to Removal:  Level 0  Pressure Applied For 30 MINUTES    Minutes Beginning at 1235  Manual:   Yes.    Patient Status During Pull:  stable  Post Pull Groin Site:  Level 0  Post Pull Instructions Given:  Yes.    Post Pull Pulses Present:  Yes.    Dressing Applied:  Yes.    Comments:  Checked site throughout shift with no change in assessment

## 2016-10-25 NOTE — Progress Notes (Signed)
Hourly rounding done. Pt has no needs at this time.

## 2016-10-26 ENCOUNTER — Other Ambulatory Visit: Payer: Self-pay | Admitting: Physician Assistant

## 2016-10-26 ENCOUNTER — Encounter (HOSPITAL_COMMUNITY): Payer: Self-pay | Admitting: Physician Assistant

## 2016-10-26 DIAGNOSIS — N183 Chronic kidney disease, stage 3 unspecified: Secondary | ICD-10-CM | POA: Diagnosis present

## 2016-10-26 DIAGNOSIS — I10 Essential (primary) hypertension: Secondary | ICD-10-CM

## 2016-10-26 DIAGNOSIS — E1051 Type 1 diabetes mellitus with diabetic peripheral angiopathy without gangrene: Secondary | ICD-10-CM | POA: Diagnosis not present

## 2016-10-26 DIAGNOSIS — E1022 Type 1 diabetes mellitus with diabetic chronic kidney disease: Secondary | ICD-10-CM

## 2016-10-26 DIAGNOSIS — Z794 Long term (current) use of insulin: Secondary | ICD-10-CM | POA: Diagnosis not present

## 2016-10-26 DIAGNOSIS — E876 Hypokalemia: Secondary | ICD-10-CM | POA: Diagnosis not present

## 2016-10-26 DIAGNOSIS — Z7982 Long term (current) use of aspirin: Secondary | ICD-10-CM | POA: Diagnosis not present

## 2016-10-26 DIAGNOSIS — I739 Peripheral vascular disease, unspecified: Secondary | ICD-10-CM

## 2016-10-26 DIAGNOSIS — I2510 Atherosclerotic heart disease of native coronary artery without angina pectoris: Secondary | ICD-10-CM

## 2016-10-26 DIAGNOSIS — G4733 Obstructive sleep apnea (adult) (pediatric): Secondary | ICD-10-CM | POA: Diagnosis not present

## 2016-10-26 DIAGNOSIS — Z87891 Personal history of nicotine dependence: Secondary | ICD-10-CM | POA: Diagnosis not present

## 2016-10-26 DIAGNOSIS — E785 Hyperlipidemia, unspecified: Secondary | ICD-10-CM | POA: Diagnosis not present

## 2016-10-26 DIAGNOSIS — I1310 Hypertensive heart and chronic kidney disease without heart failure, with stage 1 through stage 4 chronic kidney disease, or unspecified chronic kidney disease: Secondary | ICD-10-CM | POA: Diagnosis not present

## 2016-10-26 DIAGNOSIS — Z7902 Long term (current) use of antithrombotics/antiplatelets: Secondary | ICD-10-CM | POA: Diagnosis not present

## 2016-10-26 DIAGNOSIS — I119 Hypertensive heart disease without heart failure: Secondary | ICD-10-CM

## 2016-10-26 DIAGNOSIS — I70212 Atherosclerosis of native arteries of extremities with intermittent claudication, left leg: Secondary | ICD-10-CM | POA: Diagnosis not present

## 2016-10-26 LAB — BASIC METABOLIC PANEL
Anion gap: 11 (ref 5–15)
BUN: 18 mg/dL (ref 6–20)
CO2: 21 mmol/L — ABNORMAL LOW (ref 22–32)
Calcium: 9.1 mg/dL (ref 8.9–10.3)
Chloride: 105 mmol/L (ref 101–111)
Creatinine, Ser: 1.48 mg/dL — ABNORMAL HIGH (ref 0.61–1.24)
GFR calc Af Amer: 53 mL/min — ABNORMAL LOW (ref >60)
GFR calc non Af Amer: 46 mL/min — ABNORMAL LOW (ref >60)
Glucose, Bld: 120 mg/dL — ABNORMAL HIGH (ref 65–99)
Potassium: 3.7 mmol/L (ref 3.5–5.1)
Sodium: 137 mmol/L (ref 135–145)

## 2016-10-26 LAB — CBC
HCT: 38.5 % — ABNORMAL LOW (ref 39.0–52.0)
Hemoglobin: 13.2 g/dL (ref 13.0–17.0)
MCH: 30.1 pg (ref 26.0–34.0)
MCHC: 34.3 g/dL (ref 30.0–36.0)
MCV: 87.9 fL (ref 78.0–100.0)
Platelets: 136 10*3/uL — ABNORMAL LOW (ref 150–400)
RBC: 4.38 MIL/uL (ref 4.22–5.81)
RDW: 13.4 % (ref 11.5–15.5)
WBC: 8.9 10*3/uL (ref 4.0–10.5)

## 2016-10-26 LAB — GLUCOSE, CAPILLARY: Glucose-Capillary: 107 mg/dL — ABNORMAL HIGH (ref 65–99)

## 2016-10-26 NOTE — Discharge Summary (Signed)
Discharge Summary    Patient ID: Joel Wilcken.,  MRN: [id number], DOB/AGE: [birth year] 72 y.o.  Admit date: 10/25/2016 Discharge date: 10/26/2016  Primary Care Provider: Jani Gravel Primary Cardiologist: Dr. Tamala Julian, PV - Dr. 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 PV angio this admission, please see full report and below for summary. _____________     History of Present Illness     Joel Harout Aroyo. is a 70 y.o. male with history of morbid obesity, CAD, tobacco abuse, HTN, hypokalemia, DM, CKD III who presented for planned L SFA intervention. He originally underwent diagnostic PV angio in 08/2016 with bilateral LE PAD. In 09/2015 he underwent diamondback orbital rotational atherectomy right common femoral artery and right SFA, Drug eluting balloon angioplasty right SFA, balloon angioplasty right common femoral artery. Due to his CKD, it was recommended to stage his LLE intervention.   Hospital Course    On 10/25/16 he underwent: Procedures Performed: 1. Contralateral access (third order catheter placement) 2. Placement of an NAV 6 distal protection filter left below the knee popliteal artery 3. Diamondback orbital rotational atherectomy left SFA 4. Drug eluting balloon angioplasty left SFA 5. Left lower extremity angiography with runoff  He tolerated this procedure well. Post-PV creatinine was stable. The patient feels well today. Dr. Ellyn Hack has seen and examined the patient today and feels he is stable for discharge. He will have LEA duplex next week and then f/u in office in 2-3 weeks. The PV scheduler was busy  at time of discharge, thus I sent a staff message to help arrange the duplex. Her f/u appt is already scheduled - Dr. Gwenlyn Found did not have any availability so he will see one of Dr. Kennon Holter PAs on a day when Dr. Gwenlyn Found is in the office. _____________  Discharge Vitals Blood pressure (!) 132/51, pulse 71, temperature 97.5 F (36.4 C), temperature source Oral, resp. rate 19, height 5\' 8"  (1.727 m), weight 284 lb 13.4 oz (129.2 kg), SpO2 96 %.  Filed Weights   10/25/16 0539 10/26/16 0121  Weight: 284 lb (128.8 kg) 284 lb 13.4 oz (129.2 kg)    Labs & Radiologic Studies    CBC  Recent Labs  10/26/16 0316  WBC 8.9  HGB 13.2  HCT 38.5*  MCV 87.9  PLT [id number]*   Basic Metabolic Panel  Recent Labs  10/26/16 0316  NA 137  K 3.7  CL 105  CO2 21*  GLUCOSE 120*  BUN 18  CREATININE 1.48*  CALCIUM 9.1  ____________  No results found. Disposition   Pt is being discharged home today in good condition.  Follow-up Plans & Appointments    Follow-up Information    [facility] Follow up.   Specialty:  Cardiology Why:  The office will call you to schedule your leg ultrasound for 1 week. You will then follow up with Joel Rivera, one of Dr. Kennon Holter PAs, on 11/16/16 at [place] information: [address] [place] [phone]         Discharge Instructions    Diet - low sodium  heart healthy    Complete by:  As directed    Increase activity slowly    Complete by:  As directed    No driving for 2 days. No lifting over 5 lbs for 1 week. No sexual activity for 1 week. Keep procedure site clean & dry. If you notice increased pain, swelling, bleeding or pus, call/return!  You may shower, but no soaking baths/hot tubs/pools for 1 week.      Discharge Medications   Allergies as of 10/26/2016      Reactions   Atorvastatin Other (See Comments)   Muscle pain/weaknesses (Myalgia)   Pravastatin Other (See Comments)   Muscle  pain/weaknesses (Myalgia)   Simvastatin Other (See Comments)   Muscle pain/weaknesses (Myalgia)   Terazosin Other (See Comments)   Unknown reaction.      Medication List    TAKE these medications   acetaminophen 325 MG tablet Commonly known as:  TYLENOL Take 650 mg by mouth every 6 (six) hours as needed (for pain.).   aspirin EC 81 MG tablet Take 81 mg by mouth daily.   carvedilol 25 MG tablet Commonly known as:  COREG Take 1 tablet (25 mg total) by mouth 2 (two) times daily with a meal.   chlorthalidone 50 MG tablet Commonly known as:  HYGROTON Take 25 mg by mouth every other day.   cholecalciferol 1000 units tablet Commonly known as:  VITAMIN D Take 1,000 Units by mouth daily.   citalopram 20 MG tablet Commonly known as:  CELEXA Take 10 mg by mouth daily.   clopidogrel 75 MG tablet Commonly known as:  PLAVIX Take 1 tablet (75 mg total) by mouth daily.   guaiFENesin 600 MG 12 hr tablet Commonly known as:  MUCINEX Take 600 mg by mouth daily as needed for cough or to loosen phlegm.   HUMALOG KWIKPEN 100 UNIT/ML KiwkPen Generic drug:  insulin lispro Inject 7-11 Units into the skin 2 (two) times daily. 7 units in the morning before breakfast, 11 units at lunch   insulin glargine 100 UNIT/ML injection Commonly known as:  LANTUS Inject 49 Units into the skin at bedtime.   JARDIANCE 25 MG Tabs tablet Generic drug:  empagliflozin Take 25 mg by mouth daily.   l-methylfolate-B6-B12 3-35-2 MG Tabs tablet Commonly known as:  METANX Take 1 tablet by mouth 2 (two) times daily.   losartan 100 MG tablet Commonly known as:  COZAAR Take 1 tablet (100 mg total) by mouth daily.   montelukast 10 MG tablet Commonly known as:  SINGULAIR Take 10 mg by mouth at bedtime.   NIFEdipine 30 MG 24 hr tablet Commonly known as:  PROCARDIA-XL/ADALAT CC Take 30 mg by mouth daily.   pantoprazole 20 MG tablet Commonly known as:  PROTONIX Take 20 mg by mouth daily.   polyethylene  glycol packet Commonly known as:  MIRALAX / GLYCOLAX Take 17 g by mouth daily as needed for mild constipation.   prazosin 1 MG capsule Commonly known as:  MINIPRESS Take 3 mg by mouth at bedtime.   rosuvastatin 40 MG tablet Commonly known as:  CRESTOR Take 40 mg by mouth daily at 12 noon.   sodium chloride 0.65 % Soln nasal spray Commonly known as:  OCEAN Place 1 spray into both nostrils 3 (three) times daily as needed for congestion.   ZYRTEC ALLERGY 10 MG tablet Generic drug:  cetirizine Take 10 mg by mouth daily.        Allergies:  Allergies  Allergen Reactions  .  Atorvastatin Other (See Comments)    Muscle pain/weaknesses (Myalgia)  . Pravastatin Other (See Comments)    Muscle pain/weaknesses (Myalgia)  . Simvastatin Other (See Comments)    Muscle pain/weaknesses (Myalgia)  . Terazosin Other (See Comments)    Unknown reaction.    Outstanding Labs/Studies   N/A  Duration of Discharge Encounter   Greater than 30 minutes including physician time.  Signed, Joel Hai Dunn PA-C 10/26/2016, 8:57 AM   I have seen, examined and evaluated the patient this AM along with Melina Copa, PA-C.  After reviewing all the available data and chart, we discussed the patients laboratory, study & physical findings as well as symptoms in detail. I agree with her findings, examination as well as impression recommendations as per our discussion.    Mr. Graffeo looked and felt great after his perform vascular intervention. He was able to walk down the hallway without any pain and was very happy.  His access site looks great. No issues. Blood pressure stable. Ready for discharge with follow-up as recommended.     Glenetta Hew, M.D., M.S. Interventional Cardiologist   Pager # [phone] Phone # [phone] [address] [place], [place] [address]

## 2016-10-26 NOTE — Progress Notes (Signed)
Patient Name: Joel Rivera. Date of Encounter: 10/26/2016  Primary Cardiologist: 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  Inpatient Medications    . aspirin EC  81 mg Oral Daily  . carvedilol  25 mg Oral BID WC  . [START ON 10/27/2016] chlorthalidone  25 mg Oral QODAY  . cholecalciferol  1,000 Units Oral Daily  . citalopram  10 mg Oral Daily  . clopidogrel  75 mg Oral Q breakfast  . insulin aspart  11 Units Subcutaneous Q lunch  . insulin aspart  11 Units Subcutaneous QAC supper  . insulin aspart  7 Units Subcutaneous Q breakfast  . insulin glargine  49 Units Subcutaneous QHS  . l-methylfolate-B6-B12  1 tablet Oral BID  . losartan  100 mg Oral Daily  . montelukast  10 mg Oral QHS  . NIFEdipine  30 mg Oral Daily  . pantoprazole  20 mg Oral Daily  . prazosin  3 mg Oral QHS  . rosuvastatin  40 mg Oral Q1200    Vital Signs    Vitals:   10/25/16 1600 10/25/16 1936 10/25/16 2000 10/26/16 0121  BP: (!) 154/58 (!) 168/55  (!) 132/51  Pulse: 66 74  61  Resp: 16 16 12  (!) 21  Temp: 97.7 F (36.5 C) 97 F (36.1 C)  97.3 F (36.3 C)  TempSrc: Oral Oral  Axillary  SpO2: 96% 95%  96%  Weight:    284 lb 13.4 oz (129.2 kg)  Height:        Intake/Output Summary (Last 24 hours) at 10/26/16 0821 Last data filed at 10/26/16 [id number]  Gross per 24 hour  Intake             2070 ml  Output             4025 ml  Net            -1955 ml   Filed Weights   10/25/16 0539 10/26/16 0121  Weight: 284 lb (128.8 kg) 284 lb 13.4 oz (129.2 kg)    Physical Exam    General: Well  developed, well nourished obese AAM, in no acute distress. HEENT: Normocephalic, atraumatic, sclera non-icteric, no xanthomas, nares are without discharge. Neck:  JVP not elevated. Lungs: Clear bilaterally to auscultation without wheezes, rales, or rhonchi. Breathing is unlabored. Cardiac: RRR S1 S2 without murmurs, rubs, or gallops.  Abdomen: Soft, non-tender, non-distended with normoactive bowel sounds. No rebound/guarding. Extremities: No clubbing or cyanosis. No edema. Distal pedal pulses are 2+ and equal bilaterally. Skin: Warm and dry, no significant rash. Right groin cath site without hematoma, ecchymosis, or bruit. Neuro: Alert and oriented X 3. Sensation in tact. Follows commands. Psych:  Responds to questions appropriately with a normal affect.  Labs    CBC  Recent Labs  10/26/16 0316  WBC 8.9  HGB 13.2  HCT 38.5*  MCV 87.9  PLT [id number]*   Basic Metabolic Panel  Recent Labs  10/26/16 0316  NA 137  K 3.7  CL 105  CO2 21*  GLUCOSE 120*  BUN 18  CREATININE 1.48*  CALCIUM 9.1    Telemetry    NSR/nocturnal brady upper 40s. No sig bradycardia or pauses.  Patient Profile     67M with morbid obesity, CAD (specifics not indicated in chart), tobacco abuse, HTN, LVH, OSA, carotid artery disease, DM, CKD III, depression who presented for planned L SFA intervention. He originally underwent diagnostic PV angio in 08/2016 with bilateral LE PAD. In 09/2015 he underwent RLE PTA. Due to his CKD, it was recommended to stage his LLE intervention. On 10/25/16 he underwent Diamondback orbital rotational atherectomy left SFA & Drug eluting balloon angioplasty left SFA.  Assessment & Plan    1. PAD - s/p PTA. Doing well. Continue current regimen. Will arrange LE art 1 week and f/u 2-3 weeks as recommended.  2. HTN - controlled.  3. CKD III - stable.  4. HLD - continue statin.  Signed, Charlie Pitter, PA-C  10/26/2016, 8:21 AM   I have seen, examined and evaluated the patient  this AM along with Melina Copa, PA-C.  After reviewing all the available data and chart, we discussed the patients laboratory, study & physical findings as well as symptoms in detail. I agree with her findings, examination as well as impression recommendations as per our discussion.    [name] looked and felt great after his perform vascular intervention. He was able to walk down the hallway without any pain and was very happy.  His access site looks great. No issues. Blood pressure stable. Ready for discharge with follow-up as recommended.  Glenetta Hew, M.D., M.S. Interventional Cardiologist   Pager # [phone] Phone # [phone] [address] [place], [place] [address]

## 2016-10-29 ENCOUNTER — Encounter (HOSPITAL_COMMUNITY): Payer: Self-pay | Admitting: Cardiovascular Disease

## 2016-10-29 MED FILL — Fentanyl Citrate Preservative Free (PF) Inj 100 MCG/2ML: INTRAMUSCULAR | Qty: 2 | Status: AC

## 2016-10-29 MED FILL — Midazolam HCl Inj 2 MG/2ML (Base Equivalent): INTRAMUSCULAR | Qty: 2 | Status: AC

## 2016-10-31 ENCOUNTER — Other Ambulatory Visit: Payer: Self-pay | Admitting: Physician Assistant

## 2016-10-31 DIAGNOSIS — I739 Peripheral vascular disease, unspecified: Secondary | ICD-10-CM

## 2016-11-06 ENCOUNTER — Ambulatory Visit (HOSPITAL_COMMUNITY)
Admission: RE | Admit: 2016-11-06 | Discharge: 2016-11-06 | Disposition: A | Discharge status: Home / Self Care | Payer: Medicare Other | Source: Ambulatory Visit | Attending: Cardiology | Admitting: Cardiology

## 2016-11-06 DIAGNOSIS — I739 Peripheral vascular disease, unspecified: Secondary | ICD-10-CM | POA: Insufficient documentation

## 2016-11-06 DIAGNOSIS — Z87891 Personal history of nicotine dependence: Secondary | ICD-10-CM | POA: Insufficient documentation

## 2016-11-06 DIAGNOSIS — E119 Type 2 diabetes mellitus without complications: Secondary | ICD-10-CM | POA: Diagnosis not present

## 2016-11-06 DIAGNOSIS — I10 Essential (primary) hypertension: Secondary | ICD-10-CM | POA: Diagnosis not present

## 2016-11-06 DIAGNOSIS — E785 Hyperlipidemia, unspecified: Secondary | ICD-10-CM | POA: Diagnosis not present

## 2016-11-13 ENCOUNTER — Other Ambulatory Visit: Payer: Self-pay | Admitting: Cardiovascular Disease

## 2016-11-13 DIAGNOSIS — I739 Peripheral vascular disease, unspecified: Secondary | ICD-10-CM

## 2016-11-16 ENCOUNTER — Ambulatory Visit: Payer: Medicare Other | Admitting: Physician Assistant

## 2016-12-03 ENCOUNTER — Telehealth: Payer: Self-pay | Admitting: Cardiovascular Disease

## 2016-12-03 NOTE — Telephone Encounter (Signed)
Requesting surgical clearance:  1. Type of surgery: Colonoscopy    2.Surgical Date: 12/17/16  3. Medications that need to be held: Plavix and ASA 81--7 days prior    4. CAD: Yes  5. I will defer to:  Dr. Pearla Dubonnet Information:   [facility] Phone:  [phone] Fax:  [phone]

## 2016-12-04 ENCOUNTER — Encounter: Payer: Self-pay | Admitting: Cardiovascular Disease

## 2016-12-04 NOTE — Telephone Encounter (Signed)
Clearance letter faxed to number provided via [employer].  

## 2016-12-04 NOTE — Telephone Encounter (Signed)
Cleared for colonoscopy. Okay to hold antiplatelet agents

## 2016-12-05 ENCOUNTER — Telehealth: Payer: Self-pay | Admitting: Cardiovascular Disease

## 2016-12-05 NOTE — Telephone Encounter (Signed)
Closed Encounter  °

## 2016-12-13 ENCOUNTER — Telehealth: Payer: Self-pay | Admitting: Cardiovascular Disease

## 2016-12-13 NOTE — Telephone Encounter (Signed)
New Message  Pts wife voiced wanting nurse to call as soon as it's faxed.  Please f/u with pt

## 2016-12-13 NOTE — Telephone Encounter (Signed)
Spoke to pt wife, letting her know clearance letter sent again to [facility] and it went through. She stated she will call them to make sure they received it.

## 2016-12-24 IMAGING — NM NM MISC PROCEDURE
3 series · 18 of 18 positions shown · non-contrast
Comparison: none

[Series 1: wbr_r-proj_st rest_(id)_sa · 6.5mm · 6.51mm/px · 6 of 64 frames shown]
[frame 6/64]
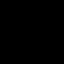
[frame 16/64]
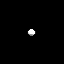
[frame 27/64]
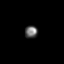
[frame 38/64]
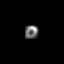
[frame 48/64]
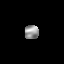
[frame 59/64]
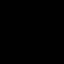

[Series 1: wbr_s-proj_st stress_(id)_sa · 6.5mm · 6.51mm/px · 6 of 64 frames shown (1 of 2)]
[frame 6/64]
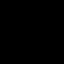
[frame 16/64]
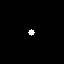
[frame 27/64]
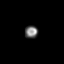
[frame 38/64]
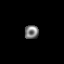
[frame 48/64]
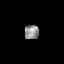
[frame 59/64]
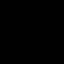

[Series 1: wbr_s-proj_st stress_(id)_sa · 6.5mm · 6.51mm/px · 6 of 512 frames shown (2 of 2)]
[frame 43/512]
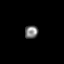
[frame 128/512]
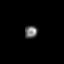
[frame 214/512]
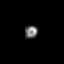
[frame 299/512]
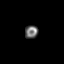
[frame 384/512]
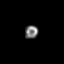
[frame 470/512]
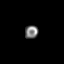

[18 of 18 positions shown; findings below may reference images not displayed]

Canned report from images found in remote index.

Refer to host system for actual result text.

## 2017-01-07 DIAGNOSIS — N183 Chronic kidney disease, stage 3 (moderate): Secondary | ICD-10-CM | POA: Diagnosis not present

## 2017-01-11 DIAGNOSIS — I10 Essential (primary) hypertension: Secondary | ICD-10-CM | POA: Diagnosis not present

## 2017-01-11 DIAGNOSIS — E119 Type 2 diabetes mellitus without complications: Secondary | ICD-10-CM | POA: Diagnosis not present

## 2017-01-11 DIAGNOSIS — N183 Chronic kidney disease, stage 3 (moderate): Secondary | ICD-10-CM | POA: Diagnosis not present

## 2017-01-11 DIAGNOSIS — I739 Peripheral vascular disease, unspecified: Secondary | ICD-10-CM | POA: Diagnosis not present

## 2017-02-27 ENCOUNTER — Encounter (HOSPITAL_COMMUNITY): Payer: Self-pay | Admitting: Emergency Medicine

## 2017-02-27 ENCOUNTER — Ambulatory Visit (HOSPITAL_COMMUNITY)
Admission: EM | Admit: 2017-02-27 | Discharge: 2017-02-27 | Disposition: A | Discharge status: Home / Self Care | Payer: Medicare Other | Attending: Family Medicine | Admitting: Family Medicine

## 2017-02-27 DIAGNOSIS — L0291 Cutaneous abscess, unspecified: Secondary | ICD-10-CM

## 2017-02-27 MED ORDER — DOXYCYCLINE HYCLATE 100 MG PO CAPS: 100.0000 mg | ORAL_CAPSULE | Freq: Two times a day (BID) | ORAL | 0 refills | Status: DC

## 2017-02-27 NOTE — ED Triage Notes (Signed)
Abscess or cyst on back.  Patient noticed this 2 months ago and has gradually increased in size and more painful

## 2017-02-27 NOTE — ED Provider Notes (Signed)
CSN: [id number]     Arrival date & time 02/27/17  1457 History   None    Chief Complaint  Patient presents with  . Abscess   (Consider location/radiation/quality/duration/timing/severity/associated sxs/prior Treatment) C/o abscess on upper back   The history is provided by the patient.  Abscess  Abscess location: upper back. Size:  6 cm Abscess quality: fluctuance   Red streaking: no   Duration:  2 months Progression:  Worsening Chronicity:  New Relieved by:  Nothing Worsened by:  Nothing   Past Medical History:  Diagnosis Date  . Agent orange exposure   . Arthritis    "neck; left shoulder" (10/25/2016)  . CAD (coronary artery disease)   . Carotid artery disease ([place])   . CHF (congestive heart failure) ([place])   . Chronic post-traumatic stress disorder (PTSD) 03/16/2015  . Chronic sinus infection   . CKD (chronic kidney disease), stage III   . Claustrophobia 03/16/2015  . Depression   . Diabetic neuropathy ([place])   . Former tobacco use   . GERD (gastroesophageal reflux disease)   . Hyperlipidemia   . Hypertension   . Morbid obesity ([place])   . OSA (obstructive sleep apnea)    "been checked out several times; each time never got any treatment ordered" (10/25/2016)  . PAD (peripheral artery disease) ([place])    a. Diagnostic PV Angio 08/2016 -> s/p RLE PTA in 09/2016. b. s/p LLE PTA in 10/2016.  Marland Kitchen PTSD (post-traumatic stress disorder)    "I'm a Veteran; 100% disability"  . Recurrent boils    abceses  . Type II diabetes mellitus ([place])    Past Surgical History:  Procedure Laterality Date  . ABCESS DRAINAGE     "fluid-filled boils removed off my upper back, left leg; and back of my neck"  . ABDOMINAL HERNIA REPAIR  ~ 2008  . CATARACT EXTRACTION W/ INTRAOCULAR LENS  IMPLANT, BILATERAL Bilateral   . COLONOSCOPY W/ BIOPSIES AND POLYPECTOMY     "was gettin colonoscopy q 5 yrs; 11 polyps found in Dec. 2016; going back in Feb 2018"  . CORONARY ANGIOPLASTY  ~ 2004  . CYST  EXCISION Left 2008   inguinal area  . HERNIA REPAIR     "stomach; it's coming back again" (10/25/2016)  . PERIPHERAL VASCULAR CATHETERIZATION N/A 08/27/2016   Procedure: Lower Extremity Angiography;  Surgeon: Lorretta Harp, MD;  Location: [facility];  Service: Cardiovascular;  Laterality: N/A;  . PERIPHERAL VASCULAR CATHETERIZATION N/A 09/17/2016   Procedure: Lower Extremity Angiography;  Surgeon: Lorretta Harp, MD;  Location: [facility];  Service: Cardiovascular;  Laterality: N/A;  . PERIPHERAL VASCULAR CATHETERIZATION Right 09/17/2016   Procedure: Peripheral Vascular Atherectomy;  Surgeon: Lorretta Harp, MD;  Location: [facility];  Service: Cardiovascular;  Laterality: Right;  Common Femoral   . PERIPHERAL VASCULAR CATHETERIZATION Left 10/25/2016   Procedure: Peripheral Vascular Atherectomy;  Surgeon: Lorretta Harp, MD;  Location: [facility];  Service: Cardiovascular;  Laterality: Left;  SFA   Family History  Problem Relation Age of Onset  . Urinary tract infection Mother   . Pneumonia Mother   . Aneurysm Father   . Dementia Sister   . Stroke Sister   . Sarcoidosis Sister   . Healthy Sister    Social History  Substance Use Topics  . Smoking status: Former Smoker    Packs/day: 3.00    Years: 13.00    Types: Cigarettes    Quit date: 82  .  Smokeless tobacco: Never Used  . Alcohol use 0.0 oz/week     Comment: 10/25/2016 "quit years ago"    Review of Systems  Constitutional: Negative.   HENT: Negative.   Eyes: Negative.   Respiratory: Negative.   Cardiovascular: Negative.   Gastrointestinal: Negative.   Endocrine: Negative.   Genitourinary: Negative.   Musculoskeletal: Negative.   Skin: Positive for wound.  Allergic/Immunologic: Negative.   Neurological: Negative.   Hematological: Negative.   Psychiatric/Behavioral: Negative.     Allergies  Atorvastatin; Pravastatin; Simvastatin; and Terazosin  Home Medications   Prior  to Admission medications   Medication Sig Start Date End Date Taking? Authorizing Provider  acetaminophen (TYLENOL) 325 MG tablet Take 650 mg by mouth every 6 (six) hours as needed (for pain.).    Historical Provider, MD  aspirin EC 81 MG tablet Take 81 mg by mouth daily.    Historical Provider, MD  carvedilol (COREG) 25 MG tablet Take 1 tablet (25 mg total) by mouth 2 (two) times daily with a meal. 09/18/16   Arbutus Leas, NP  cetirizine (ZYRTEC ALLERGY) 10 MG tablet Take 10 mg by mouth daily.    Historical Provider, MD  chlorthalidone (HYGROTON) 50 MG tablet Take 25 mg by mouth every other day.    Historical Provider, MD  cholecalciferol (VITAMIN D) 1000 UNITS tablet Take 1,000 Units by mouth daily.    Historical Provider, MD  citalopram (CELEXA) 20 MG tablet Take 10 mg by mouth daily.    Historical Provider, MD  clopidogrel (PLAVIX) 75 MG tablet Take 1 tablet (75 mg total) by mouth daily. 09/18/16   Arbutus Leas, NP  doxycycline (VIBRAMYCIN) 100 MG capsule Take 1 capsule (100 mg total) by mouth 2 (two) times daily. 02/27/17   Lysbeth Penner, FNP  guaiFENesin (MUCINEX) 600 MG 12 hr tablet Take 600 mg by mouth daily as needed for cough or to loosen phlegm.    Historical Provider, MD  HUMALOG KWIKPEN 100 UNIT/ML KiwkPen Inject 7-11 Units into the skin 2 (two) times daily. 7 units in the morning before breakfast, 11 units at lunch 09/13/15   Historical Provider, MD  insulin glargine (LANTUS) 100 UNIT/ML injection Inject 49 Units into the skin at bedtime.     Historical Provider, MD  JARDIANCE 25 MG TABS tablet Take 25 mg by mouth daily.  09/15/14   Historical Provider, MD  l-methylfolate-B6-B12 (METANX) 3-35-2 MG TABS Take 1 tablet by mouth 2 (two) times daily.     Historical Provider, MD  losartan (COZAAR) 100 MG tablet Take 1 tablet (100 mg total) by mouth daily. 09/19/16   Arbutus Leas, NP  montelukast (SINGULAIR) 10 MG tablet Take 10 mg by mouth at bedtime.    Historical Provider, MD  NIFEdipine  (PROCARDIA-XL/ADALAT CC) 30 MG 24 hr tablet Take 30 mg by mouth daily.    Historical Provider, MD  pantoprazole (PROTONIX) 20 MG tablet Take 20 mg by mouth daily.     Historical Provider, MD  polyethylene glycol (MIRALAX / GLYCOLAX) packet Take 17 g by mouth daily as needed for mild constipation.    Historical Provider, MD  prazosin (MINIPRESS) 1 MG capsule Take 3 mg by mouth at bedtime.    Historical Provider, MD  rosuvastatin (CRESTOR) 40 MG tablet Take 40 mg by mouth daily at 12 noon.     Historical Provider, MD  sodium chloride (OCEAN) 0.65 % SOLN nasal spray Place 1 spray into both nostrils 3 (three) times daily as needed for  congestion.    Historical Provider, MD   Meds Ordered and Administered this Visit  Medications - No data to display  BP (!) 143/77 (BP Location: Right Arm) Comment (BP Location): large cuff  Pulse 66   Temp 98.9 F (37.2 C) (Oral)   Resp (!) 22   SpO2 98%  No data found.   Physical Exam  Constitutional: He appears well-developed and well-nourished.  HENT:  Head: Normocephalic and atraumatic.  Eyes: Conjunctivae and EOM are normal. Pupils are equal, round, and reactive to light.  Neck: Normal range of motion. Neck supple.  Cardiovascular: Normal rate, regular rhythm and normal heart sounds.   Pulmonary/Chest: Effort normal and breath sounds normal.  Skin:  Upper back with approx 6 cm abscess.  Nursing note and vitals reviewed.   Urgent Care Course     .Marland KitchenIncision and Drainage Date/Time: 02/27/2017 9:16 PM Performed by: Lysbeth Penner Authorized by: Robyn Haber   Consent:    Consent obtained:  Verbal   Consent given by:  Patient   Risks discussed:  Bleeding and incomplete drainage   Alternatives discussed:  No treatment Location:    Type:  Abscess   Size:  6 cm   Location: Back. Pre-procedure details:    Skin preparation:  Betadine Anesthesia (see MAR for exact dosages):    Anesthesia method:  Local infiltration   Local anesthetic:   Lidocaine 2% WITH epi Procedure type:    Complexity:  Simple Procedure details:    Needle aspiration: no     Incision types:  Stab incision   Incision depth:  Subcutaneous   Scalpel blade:  11   Wound management:  Probed and deloculated   Drainage:  Purulent   Drainage amount:  Moderate   Wound treatment:  Wound left open   Packing materials:  None Post-procedure details:    Patient tolerance of procedure:  Tolerated well, no immediate complications   (including critical care time)  Labs Review Labs Reviewed - No data to display  Imaging Review No results found.   Visual Acuity Review  Right Eye Distance:   Left Eye Distance:   Bilateral Distance:    Right Eye Near:   Left Eye Near:    Bilateral Near:         MDM   1. Abscess    Incision and Drainage Doxycycline 100mg  one po bid x 10 days #20     Lysbeth Penner, FNP 02/27/17 2117

## 2017-03-18 DIAGNOSIS — Z0000 Encounter for general adult medical examination without abnormal findings: Secondary | ICD-10-CM | POA: Diagnosis not present

## 2017-03-18 DIAGNOSIS — N183 Chronic kidney disease, stage 3 (moderate): Secondary | ICD-10-CM | POA: Diagnosis not present

## 2017-03-18 DIAGNOSIS — I10 Essential (primary) hypertension: Secondary | ICD-10-CM | POA: Diagnosis not present

## 2017-03-18 DIAGNOSIS — E119 Type 2 diabetes mellitus without complications: Secondary | ICD-10-CM | POA: Diagnosis not present

## 2017-03-20 DIAGNOSIS — Z125 Encounter for screening for malignant neoplasm of prostate: Secondary | ICD-10-CM | POA: Diagnosis not present

## 2017-03-20 DIAGNOSIS — I2510 Atherosclerotic heart disease of native coronary artery without angina pectoris: Secondary | ICD-10-CM | POA: Diagnosis not present

## 2017-03-20 DIAGNOSIS — E119 Type 2 diabetes mellitus without complications: Secondary | ICD-10-CM | POA: Diagnosis not present

## 2017-03-20 DIAGNOSIS — N183 Chronic kidney disease, stage 3 (moderate): Secondary | ICD-10-CM | POA: Diagnosis not present

## 2017-04-23 DIAGNOSIS — G629 Polyneuropathy, unspecified: Secondary | ICD-10-CM | POA: Diagnosis not present

## 2017-04-23 DIAGNOSIS — I2510 Atherosclerotic heart disease of native coronary artery without angina pectoris: Secondary | ICD-10-CM | POA: Diagnosis not present

## 2017-04-23 DIAGNOSIS — I10 Essential (primary) hypertension: Secondary | ICD-10-CM | POA: Diagnosis not present

## 2017-04-23 DIAGNOSIS — E119 Type 2 diabetes mellitus without complications: Secondary | ICD-10-CM | POA: Diagnosis not present

## 2017-07-22 ENCOUNTER — Other Ambulatory Visit: Payer: Self-pay

## 2017-07-22 DIAGNOSIS — E119 Type 2 diabetes mellitus without complications: Secondary | ICD-10-CM | POA: Diagnosis not present

## 2017-07-22 DIAGNOSIS — I10 Essential (primary) hypertension: Secondary | ICD-10-CM | POA: Diagnosis not present

## 2017-07-22 MED ORDER — CLOPIDOGREL BISULFATE 75 MG PO TABS: 75.0000 mg | ORAL_TABLET | Freq: Every day | ORAL | 3 refills | Status: DC

## 2017-07-25 DIAGNOSIS — E119 Type 2 diabetes mellitus without complications: Secondary | ICD-10-CM | POA: Diagnosis not present

## 2017-07-25 DIAGNOSIS — E785 Hyperlipidemia, unspecified: Secondary | ICD-10-CM | POA: Diagnosis not present

## 2017-07-25 DIAGNOSIS — I10 Essential (primary) hypertension: Secondary | ICD-10-CM | POA: Diagnosis not present

## 2017-09-19 DIAGNOSIS — N183 Chronic kidney disease, stage 3 (moderate): Secondary | ICD-10-CM | POA: Diagnosis not present

## 2017-09-25 DIAGNOSIS — I739 Peripheral vascular disease, unspecified: Secondary | ICD-10-CM | POA: Diagnosis not present

## 2017-09-25 DIAGNOSIS — E1122 Type 2 diabetes mellitus with diabetic chronic kidney disease: Secondary | ICD-10-CM | POA: Diagnosis not present

## 2017-09-25 DIAGNOSIS — N183 Chronic kidney disease, stage 3 (moderate): Secondary | ICD-10-CM | POA: Diagnosis not present

## 2017-09-25 DIAGNOSIS — I10 Essential (primary) hypertension: Secondary | ICD-10-CM | POA: Diagnosis not present

## 2017-09-25 DIAGNOSIS — E785 Hyperlipidemia, unspecified: Secondary | ICD-10-CM | POA: Diagnosis not present

## 2017-09-28 IMAGING — CR DG CHEST 2V
2 series · 2 of 2 positions shown · non-contrast
Comparison: None available.

CLINICAL DATA: Preoperative evaluation prior to the angio
procedure. Prior history of smoking.

EXAM:
CHEST  2 VIEW

[w chest pa]
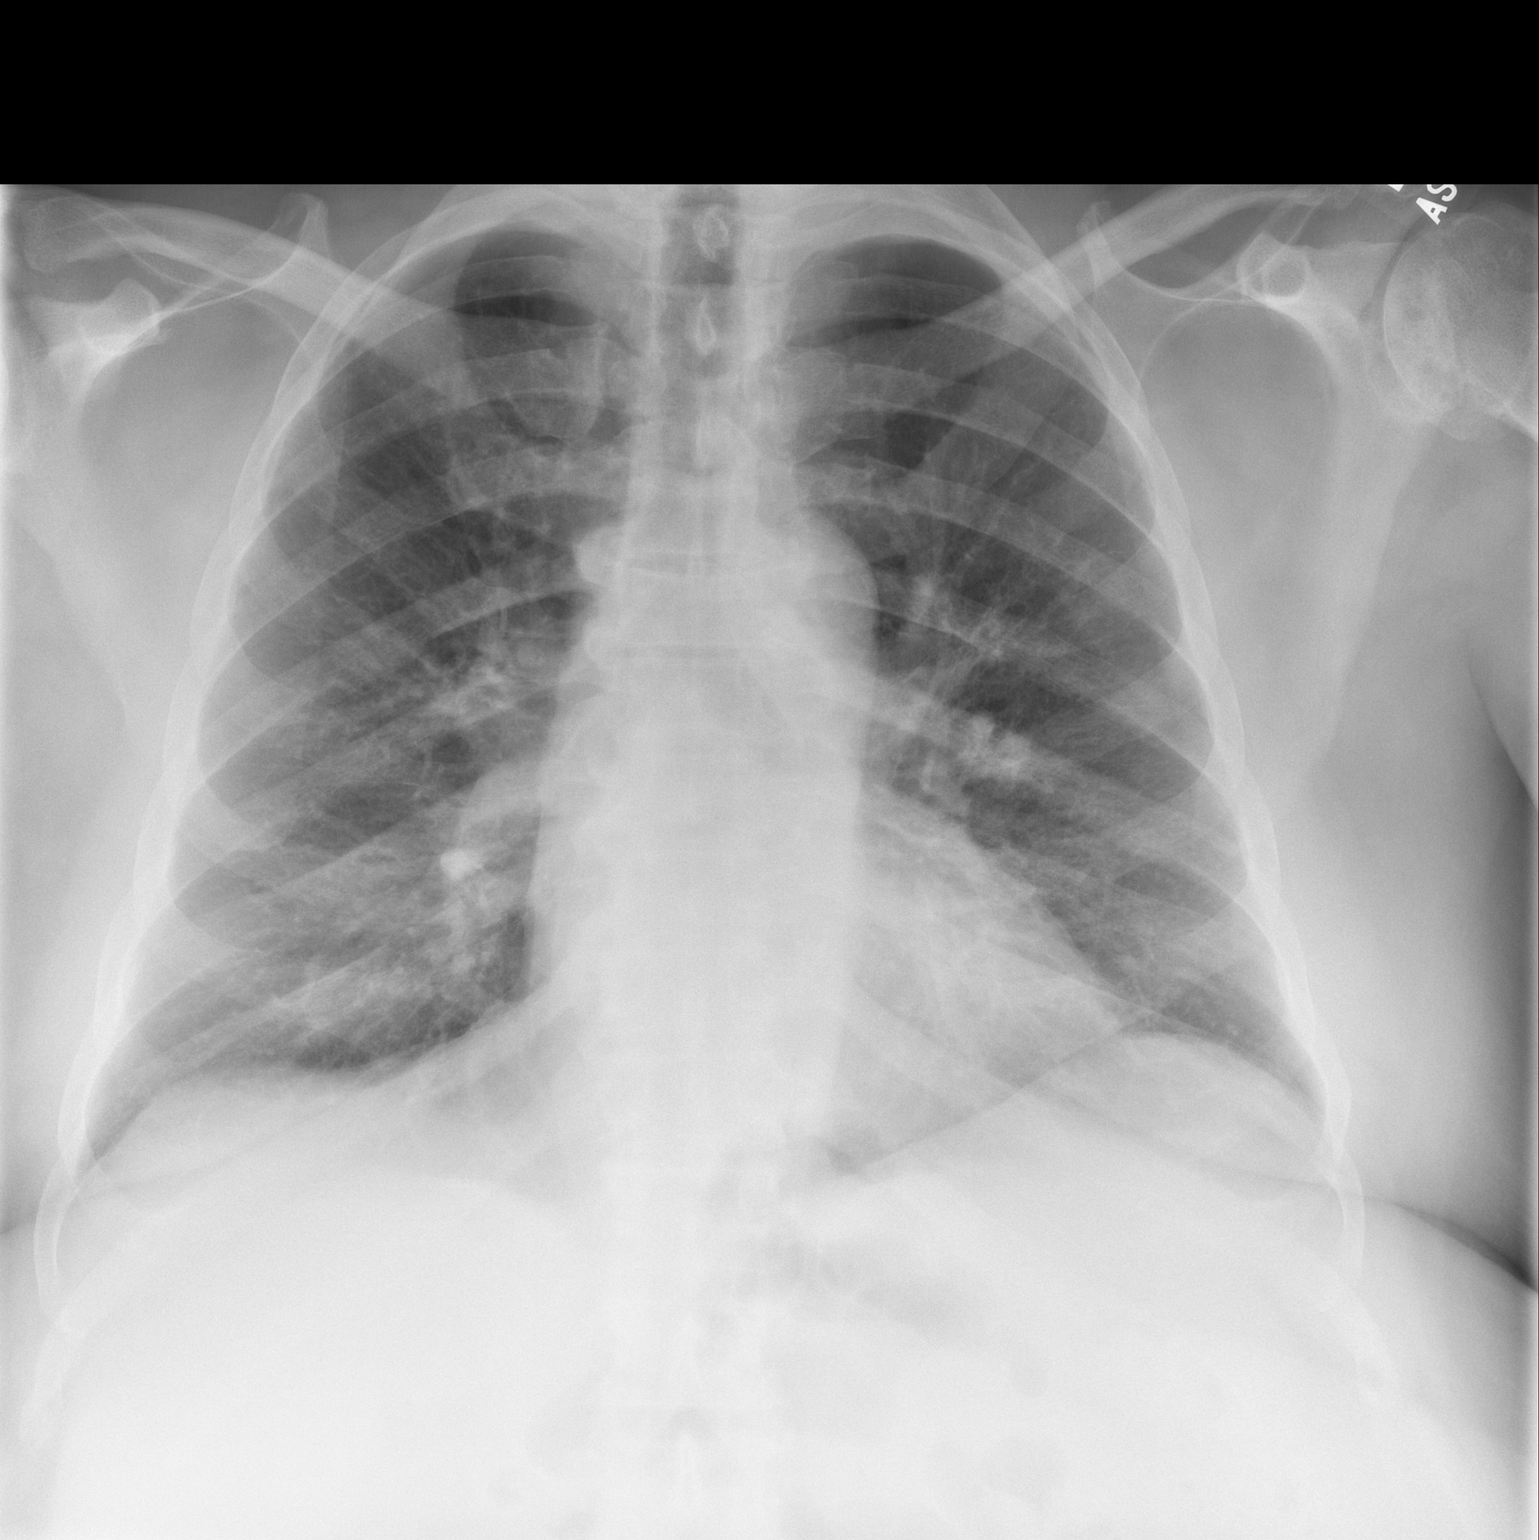

[w chest lat]
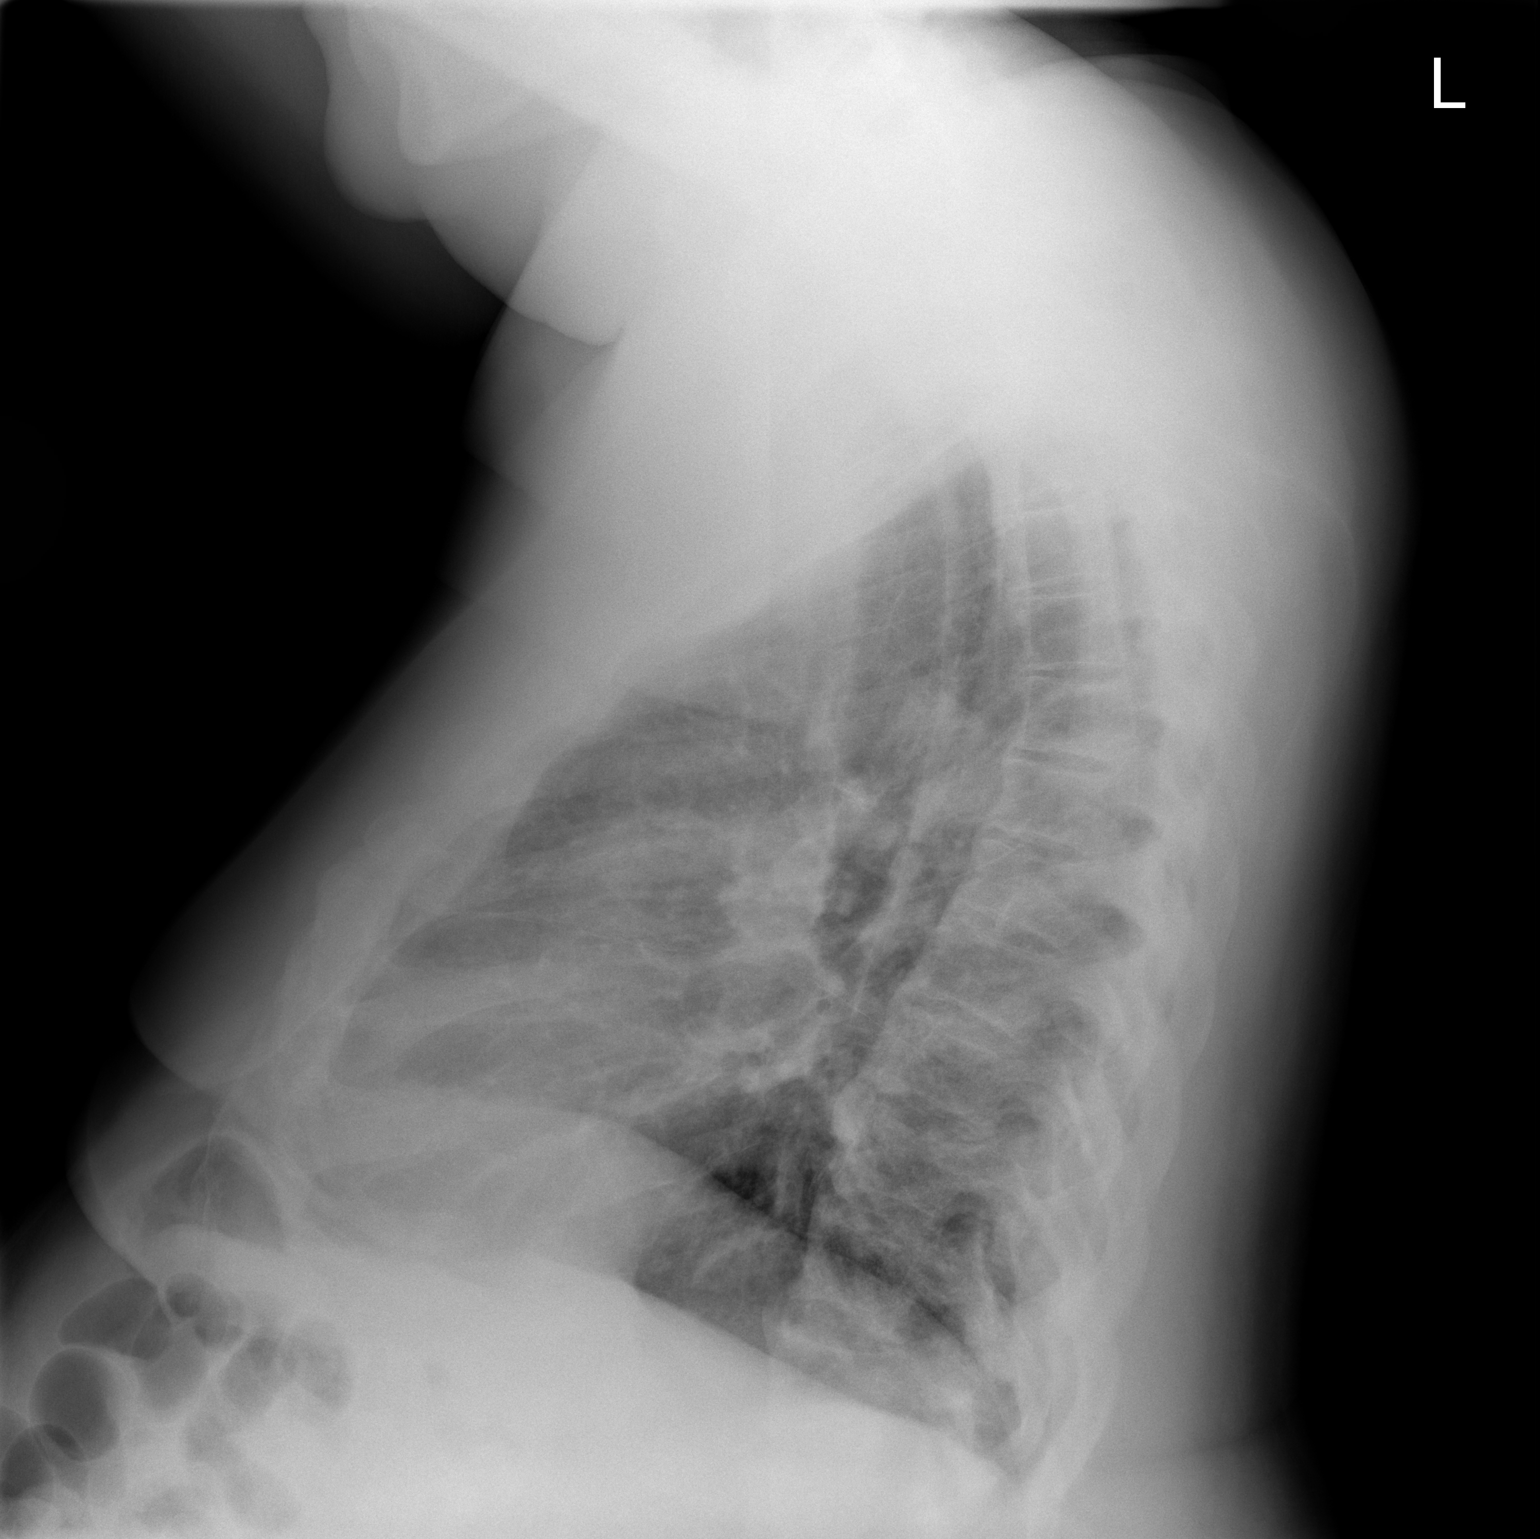

[2 of 2 positions shown; findings below may reference images not displayed]

FINDINGS: Transverse heart size at the upper limits of normal. Mediastinal
silhouette within normal limits.

Lungs are mildly hypoinflated with elevation of the left
hemidiaphragm. Diffuse peribronchial thickening present, suspected
to be related history of smoking. No consolidative airspace disease.
No pulmonary edema or pleural effusion. No pneumothorax.

No acute osseous abnormality. Multilevel degenerative changes noted
within the visualized spine. Degenerative changes also noted about
the partially visualized left shoulder.
IMPRESSION: 1. Diffuse peribronchial thickening, which may be related to
provided history smoking. Possible acute bronchiolitis could be
considered in the correct clinical setting.
2. No other active cardiopulmonary disease.

## 2017-10-17 DIAGNOSIS — E119 Type 2 diabetes mellitus without complications: Secondary | ICD-10-CM | POA: Diagnosis not present

## 2017-10-17 DIAGNOSIS — I10 Essential (primary) hypertension: Secondary | ICD-10-CM | POA: Diagnosis not present

## 2017-10-24 DIAGNOSIS — I10 Essential (primary) hypertension: Secondary | ICD-10-CM | POA: Diagnosis not present

## 2017-10-24 DIAGNOSIS — E1142 Type 2 diabetes mellitus with diabetic polyneuropathy: Secondary | ICD-10-CM | POA: Diagnosis not present

## 2017-10-24 DIAGNOSIS — N183 Chronic kidney disease, stage 3 (moderate): Secondary | ICD-10-CM | POA: Diagnosis not present

## 2017-10-24 DIAGNOSIS — Z794 Long term (current) use of insulin: Secondary | ICD-10-CM | POA: Diagnosis not present

## 2017-10-24 DIAGNOSIS — E118 Type 2 diabetes mellitus with unspecified complications: Secondary | ICD-10-CM | POA: Diagnosis not present

## 2018-01-30 DIAGNOSIS — E118 Type 2 diabetes mellitus with unspecified complications: Secondary | ICD-10-CM | POA: Diagnosis not present

## 2018-02-11 DIAGNOSIS — E119 Type 2 diabetes mellitus without complications: Secondary | ICD-10-CM | POA: Diagnosis not present

## 2018-02-11 DIAGNOSIS — I10 Essential (primary) hypertension: Secondary | ICD-10-CM | POA: Diagnosis not present

## 2018-02-11 DIAGNOSIS — E785 Hyperlipidemia, unspecified: Secondary | ICD-10-CM | POA: Diagnosis not present

## 2018-02-11 DIAGNOSIS — Z79899 Other long term (current) drug therapy: Secondary | ICD-10-CM | POA: Diagnosis not present

## 2018-02-11 DIAGNOSIS — E118 Type 2 diabetes mellitus with unspecified complications: Secondary | ICD-10-CM | POA: Diagnosis not present

## 2018-05-08 DIAGNOSIS — E785 Hyperlipidemia, unspecified: Secondary | ICD-10-CM | POA: Diagnosis not present

## 2018-05-08 DIAGNOSIS — E119 Type 2 diabetes mellitus without complications: Secondary | ICD-10-CM | POA: Diagnosis not present

## 2018-05-08 DIAGNOSIS — I10 Essential (primary) hypertension: Secondary | ICD-10-CM | POA: Diagnosis not present

## 2018-05-26 DIAGNOSIS — E785 Hyperlipidemia, unspecified: Secondary | ICD-10-CM | POA: Diagnosis not present

## 2018-05-26 DIAGNOSIS — N183 Chronic kidney disease, stage 3 (moderate): Secondary | ICD-10-CM | POA: Diagnosis not present

## 2018-05-26 DIAGNOSIS — I129 Hypertensive chronic kidney disease with stage 1 through stage 4 chronic kidney disease, or unspecified chronic kidney disease: Secondary | ICD-10-CM | POA: Diagnosis not present

## 2018-05-26 DIAGNOSIS — E1122 Type 2 diabetes mellitus with diabetic chronic kidney disease: Secondary | ICD-10-CM | POA: Diagnosis not present

## 2018-05-26 DIAGNOSIS — I2510 Atherosclerotic heart disease of native coronary artery without angina pectoris: Secondary | ICD-10-CM | POA: Diagnosis not present

## 2018-07-28 DIAGNOSIS — I10 Essential (primary) hypertension: Secondary | ICD-10-CM | POA: Diagnosis not present

## 2018-07-28 DIAGNOSIS — E119 Type 2 diabetes mellitus without complications: Secondary | ICD-10-CM | POA: Diagnosis not present

## 2018-08-12 DIAGNOSIS — E785 Hyperlipidemia, unspecified: Secondary | ICD-10-CM | POA: Diagnosis not present

## 2018-08-12 DIAGNOSIS — E119 Type 2 diabetes mellitus without complications: Secondary | ICD-10-CM | POA: Diagnosis not present

## 2018-08-18 DIAGNOSIS — I10 Essential (primary) hypertension: Secondary | ICD-10-CM | POA: Diagnosis not present

## 2018-08-18 DIAGNOSIS — E785 Hyperlipidemia, unspecified: Secondary | ICD-10-CM | POA: Diagnosis not present

## 2018-08-18 DIAGNOSIS — E119 Type 2 diabetes mellitus without complications: Secondary | ICD-10-CM | POA: Diagnosis not present

## 2018-11-17 DIAGNOSIS — E119 Type 2 diabetes mellitus without complications: Secondary | ICD-10-CM | POA: Diagnosis not present

## 2018-11-17 DIAGNOSIS — E785 Hyperlipidemia, unspecified: Secondary | ICD-10-CM | POA: Diagnosis not present

## 2018-11-24 DIAGNOSIS — E119 Type 2 diabetes mellitus without complications: Secondary | ICD-10-CM | POA: Diagnosis not present

## 2018-11-24 DIAGNOSIS — E785 Hyperlipidemia, unspecified: Secondary | ICD-10-CM | POA: Diagnosis not present

## 2018-11-24 DIAGNOSIS — I10 Essential (primary) hypertension: Secondary | ICD-10-CM | POA: Diagnosis not present

## 2019-01-01 DIAGNOSIS — L0291 Cutaneous abscess, unspecified: Secondary | ICD-10-CM | POA: Diagnosis not present

## 2019-01-21 DIAGNOSIS — E1122 Type 2 diabetes mellitus with diabetic chronic kidney disease: Secondary | ICD-10-CM | POA: Diagnosis not present

## 2019-01-21 DIAGNOSIS — I739 Peripheral vascular disease, unspecified: Secondary | ICD-10-CM | POA: Diagnosis not present

## 2019-01-21 DIAGNOSIS — I129 Hypertensive chronic kidney disease with stage 1 through stage 4 chronic kidney disease, or unspecified chronic kidney disease: Secondary | ICD-10-CM | POA: Diagnosis not present

## 2019-01-21 DIAGNOSIS — N183 Chronic kidney disease, stage 3 (moderate): Secondary | ICD-10-CM | POA: Diagnosis not present

## 2019-04-09 ENCOUNTER — Telehealth: Payer: Self-pay

## 2019-04-09 NOTE — Telephone Encounter (Signed)
I called pt to switch him from OV to VV. I spoke to his Wife and she ask me to send him the information through his MyChart. I sent the consent and info.

## 2019-04-13 ENCOUNTER — Encounter: Payer: Self-pay | Admitting: Internal Medicine

## 2019-04-13 DIAGNOSIS — E119 Type 2 diabetes mellitus without complications: Secondary | ICD-10-CM | POA: Diagnosis not present

## 2019-04-13 DIAGNOSIS — I10 Essential (primary) hypertension: Secondary | ICD-10-CM | POA: Diagnosis not present

## 2019-04-13 DIAGNOSIS — E785 Hyperlipidemia, unspecified: Secondary | ICD-10-CM | POA: Diagnosis not present

## 2019-04-13 NOTE — Progress Notes (Signed)
Virtual Visit via Video Note   This visit type was conducted due to national recommendations for restrictions regarding the COVID-19 Pandemic (e.g. social distancing) in an effort to limit this patient's exposure and mitigate transmission in our community.  Due to his co-morbid illnesses, this patient is at least at moderate risk for complications without adequate follow up.  This format is felt to be most appropriate for this patient at this time.  All issues noted in this document were discussed and addressed.  A limited physical exam was performed with this format.  Please refer to the patient's chart for his consent to telehealth for [facility].   Date:  04/13/2019   ID:  [name], DOB 03/04/1946, MRN [id number]  Patient Location: Home Provider Location: Office  PCP:  Jani Gravel, MD  Cardiologist:  No primary care provider on file.  Electrophysiologist:  None   Evaluation Performed:  Follow-Up Visit  Chief Complaint:  CAD  History of Present Illness:    Tommey Barret. is a 73 y.o. male with a history of CAD (no real specifics noted), essential hypertension with severe LVH on echo, diastolic dysfunction, obesity, hyperlipidemia, OSA - on CPAP and claudication/PVD. He has PTSD   He is doing well.  He has no specific cardiac complaints.  He is being followed both at the [facility] in [place], by the [employer], and at [facility] with Dr. Jani Gravel.  He has had recent blood work but we do not have it available.  He denies orthopnea, PND, and informs me that he has been diagnosed with sleep apnea and wears CPAP.  He has not had palpitations or episodes of syncope.  He cannot remember much about any of his recent laboratory data.  Improvement in lower extremity claudication and ability to walk without significant pain.  Still relatively sedentary.  The patient does have symptoms concerning for COVID-19 infection (fever, chills,  cough, or new shortness of breath).    Past Medical History:  Diagnosis Date  . Agent orange exposure   . Arthritis    "neck; left shoulder" (10/25/2016)  . CAD (coronary artery disease)   . Carotid artery disease ([place])   . CHF (congestive heart failure) ([place])   . Chronic post-traumatic stress disorder (PTSD) 03/16/2015  . Chronic sinus infection   . CKD (chronic kidney disease), stage III   . Claustrophobia 03/16/2015  . Depression   . Diabetic neuropathy ([place])   . Former tobacco use   . GERD (gastroesophageal reflux disease)   . Hyperlipidemia   . Hypertension   . Morbid obesity ([place])   . OSA (obstructive sleep apnea)    "been checked out several times; each time never got any treatment ordered" (10/25/2016)  . PAD (peripheral artery disease) ([place])    a. Diagnostic PV Angio 08/2016 -> s/p RLE PTA in 09/2016. b. s/p LLE PTA in 10/2016.  Marland Kitchen PTSD (post-traumatic stress disorder)    "I'm a Veteran; 100% disability"  . Recurrent boils    abceses  . Type II diabetes mellitus ([place])    Past Surgical History:  Procedure Laterality Date  . ABCESS DRAINAGE     "fluid-filled boils removed off my upper back, left leg; and back of my neck"  . ABDOMINAL HERNIA REPAIR  ~ 2008  . CATARACT EXTRACTION W/ INTRAOCULAR LENS  IMPLANT, BILATERAL Bilateral   . COLONOSCOPY W/ BIOPSIES AND POLYPECTOMY     "was gettin colonoscopy q 5 yrs; 11  polyps found in Dec. 2016; going back in Feb 2018"  . CORONARY ANGIOPLASTY  ~ 2004  . CYST EXCISION Left 2008   inguinal area  . HERNIA REPAIR     "stomach; it's coming back again" (10/25/2016)  . PERIPHERAL VASCULAR CATHETERIZATION N/A 08/27/2016   Procedure: Lower Extremity Angiography;  Surgeon: Lorretta Harp, MD;  Location: [place] [facility];  Service: Cardiovascular;  Laterality: N/A;  . PERIPHERAL VASCULAR CATHETERIZATION N/A 09/17/2016   Procedure: Lower Extremity Angiography;  Surgeon: Lorretta Harp, MD;  Location: [place] [facility];  Service:  Cardiovascular;  Laterality: N/A;  . PERIPHERAL VASCULAR CATHETERIZATION Right 09/17/2016   Procedure: Peripheral Vascular Atherectomy;  Surgeon: Lorretta Harp, MD;  Location: [facility];  Service: Cardiovascular;  Laterality: Right;  Common Femoral   . PERIPHERAL VASCULAR CATHETERIZATION Left 10/25/2016   Procedure: Peripheral Vascular Atherectomy;  Surgeon: Lorretta Harp, MD;  Location: [facility] [facility];  Service: Cardiovascular;  Laterality: Left;  SFA     No outpatient medications have been marked as taking for the 04/14/19 encounter (Appointment) with Belva Crome, MD.     Allergies:   Atorvastatin; Pravastatin; Simvastatin; and Terazosin   Social History   Tobacco Use  . Smoking status: Former Smoker    Packs/day: 3.00    Years: 13.00    Pack years: 39.00    Types: Cigarettes    Last attempt to quit: 1986    Years since quitting: 34.4  . Smokeless tobacco: Never Used  Substance Use Topics  . Alcohol use: Yes    Alcohol/week: 0.0 standard drinks    Comment: 10/25/2016 "quit years ago"  . Drug use: No     Family Hx: The patient's family history includes Aneurysm in his father; Dementia in his sister; Healthy in his sister; Pneumonia in his mother; Sarcoidosis in his sister; Stroke in his sister; Urinary tract infection in his mother.  ROS:   Please see the history of present illness.    Allergies with nasal congestion.  Difficulty wearing the CPAP mask. All other systems reviewed and are negative.   Prior CV studies:   The following studies were reviewed today:  Echocardiogram 2017 demonstrated moderate to severe left ventricular hypertrophy.  Systolic function was normal.  Labs/Other Tests and Data Reviewed:    EKG:  No ECG reviewed.  Recent Labs: No results found for requested labs within last 8760 hours.   Recent Lipid Panel No results found for: CHOL, TRIG, HDL, CHOLHDL, LDLCALC, LDLDIRECT  Wt Readings from Last 3 Encounters:  10/26/16 284  lb 13.4 oz (129.2 kg)  10/17/16 285 lb (129.3 kg)  10/15/16 285 lb 6.4 oz (129.5 kg)     Objective:    Vital Signs:  There were no vitals taken for this visit.   VITAL SIGNS:  reviewed  ASSESSMENT & PLAN:    1. Coronary artery disease involving native coronary artery of native heart without angina pectoris   2. Essential hypertension   3. Mixed hyperlipidemia   4. OSA (obstructive sleep apnea)   5. Claudication ([place])   6. 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 Educated About Covid-19 Virus Infection    PLAN:  1. Stable without anginal symptoms.  General concepts related to risk prevention were discussed in great detail. 2. Target blood pressure 130/80 mmHg.  2D Doppler echocardiogram will be repeated to follow-up on LVH in the setting of improved overall blood pressure since  2017. 3. LDL target less than 70. 4. Compliance with CPAP is encouraged. 5. Lower extremity claudication has improved. 6. Followed at [facility].  Overall education and awareness concerning primary/secondary risk prevention was discussed in detail: LDL less than 70, hemoglobin A1c less than 7, blood pressure target less than 130/80 mmHg, >150 minutes of moderate aerobic activity per week, avoidance of smoking, weight control (via diet and exercise), and continued surveillance/management of/for obstructive sleep apnea.   COVID-19 Education: The signs and symptoms of COVID-19 were discussed with the patient and how to seek care for testing (follow up with PCP or arrange E-visit).  The importance of social distancing was discussed today.  Time:   Today, I have spent 15 minutes with the patient with telehealth technology discussing the above problems.     Medication Adjustments/Labs and Tests Ordered: Current medicines are reviewed at length with the patient today.  Concerns regarding medicines are outlined above.   Tests Ordered: No orders of the defined types were placed  in this encounter.   Medication Changes: No orders of the defined types were placed in this encounter.   Disposition:  Follow up in 6 month(s)  Signed, Sinclair Grooms, MD  04/13/2019 7:34 PM    [place]

## 2019-04-14 ENCOUNTER — Telehealth (INDEPENDENT_AMBULATORY_CARE_PROVIDER_SITE_OTHER): Payer: Medicare Other | Admitting: Interventional Cardiology

## 2019-04-14 ENCOUNTER — Encounter: Payer: Self-pay | Admitting: Interventional Cardiology

## 2019-04-14 ENCOUNTER — Other Ambulatory Visit: Payer: Self-pay

## 2019-04-14 VITALS — BP 117/63 | HR 83 | Ht 68.0 in | Wt 282.0 lb

## 2019-04-14 DIAGNOSIS — N183 Chronic kidney disease, stage 3 unspecified: Secondary | ICD-10-CM

## 2019-04-14 DIAGNOSIS — G4733 Obstructive sleep apnea (adult) (pediatric): Secondary | ICD-10-CM

## 2019-04-14 DIAGNOSIS — I10 Essential (primary) hypertension: Secondary | ICD-10-CM

## 2019-04-14 DIAGNOSIS — I739 Peripheral vascular disease, unspecified: Secondary | ICD-10-CM

## 2019-04-14 DIAGNOSIS — I2510 Atherosclerotic heart disease of native coronary artery without angina pectoris: Secondary | ICD-10-CM | POA: Diagnosis not present

## 2019-04-14 DIAGNOSIS — E782 Mixed hyperlipidemia: Secondary | ICD-10-CM

## 2019-04-14 DIAGNOSIS — E1022 Type 1 diabetes mellitus with diabetic chronic kidney disease: Secondary | ICD-10-CM

## 2019-04-14 DIAGNOSIS — Z7189 Other specified counseling: Secondary | ICD-10-CM

## 2019-04-14 NOTE — Patient Instructions (Signed)
Medication Instructions:  Your physician recommends that you continue on your current medications as directed. Please refer to the Current Medication list given to you today.  If you need a refill on your cardiac medications before your next appointment, please call your pharmacy.   Lab work: None If you have labs (blood work) drawn today and your tests are completely normal, you will receive your results only by: Marland Kitchen MyChart Message (if you have MyChart) OR . A paper copy in the mail If you have any lab test that is abnormal or we need to change your treatment, we will call you to review the results.  Testing/Procedures: Your physician has requested that you have an echocardiogram before the end of the year. Echocardiography is a painless test that uses sound waves to create images of your heart. It provides your doctor with information about the size and shape of your heart and how well your heart's chambers and valves are working. This procedure takes approximately one hour. There are no restrictions for this procedure.    Follow-Up: At [facility], you and your health needs are our priority.  As part of our continuing mission to provide you with exceptional heart care, we have created designated Provider Care Teams.  These Care Teams include your primary Cardiologist (physician) and Advanced Practice Providers (APPs -  Physician Assistants and Nurse Practitioners) who all work together to provide you with the care you need, when you need it. You will need a follow up appointment in 12 months.  Please call our office 2 months in advance to schedule this appointment.  You may see Dr. Tamala Julian or one of the following Advanced Practice Providers on your designated Care Team:   Truitt Merle, NP Cecilie Kicks, NP . Kathyrn Drown, NP  Any Other Special Instructions Will Be Listed Below (If Applicable).

## 2019-04-16 DIAGNOSIS — I10 Essential (primary) hypertension: Secondary | ICD-10-CM | POA: Diagnosis not present

## 2019-04-16 DIAGNOSIS — E1165 Type 2 diabetes mellitus with hyperglycemia: Secondary | ICD-10-CM | POA: Diagnosis not present

## 2019-04-16 DIAGNOSIS — Z794 Long term (current) use of insulin: Secondary | ICD-10-CM | POA: Diagnosis not present

## 2019-04-16 DIAGNOSIS — E785 Hyperlipidemia, unspecified: Secondary | ICD-10-CM | POA: Diagnosis not present

## 2019-04-16 DIAGNOSIS — I2510 Atherosclerotic heart disease of native coronary artery without angina pectoris: Secondary | ICD-10-CM | POA: Diagnosis not present

## 2019-05-08 ENCOUNTER — Telehealth (HOSPITAL_COMMUNITY): Payer: Self-pay | Admitting: *Deleted

## 2019-05-08 NOTE — Telephone Encounter (Signed)
Spoke with pt.s wife per DPR  COVID-19 Pre-Screening Questions:  . Do you currently have a fever? NO (yes = cancel and refer to pcp for e-visit) . Have you recently travelled on a cruise, internationally, or to [place], [place], [place], [place], [place], or [place], [place] [employer]) ? No (yes = cancel, stay home, monitor symptoms, and contact pcp or initiate e-visit if symptoms develop) . Have you been in contact with someone that is currently pending confirmation of Covid19 testing or has been confirmed to have the [place] virus?  No (yes = cancel, stay home, away from tested individual, monitor symptoms, and contact pcp or initiate e-visit if symptoms develop) . Are you currently experiencing fatigue or cough? No (yes = pt should be prepared to have a mask placed at the time of their visit).   . Reiterated no additional visitors. Eartha Inch no earlier than 15 minutes before appointment time. . Please bring own mask.  Custer Pimenta

## 2019-05-11 ENCOUNTER — Ambulatory Visit (HOSPITAL_COMMUNITY): Payer: Medicare Other | Attending: Cardiology

## 2019-05-11 ENCOUNTER — Other Ambulatory Visit: Payer: Self-pay

## 2019-05-11 DIAGNOSIS — I2510 Atherosclerotic heart disease of native coronary artery without angina pectoris: Secondary | ICD-10-CM | POA: Diagnosis not present

## 2019-05-11 DIAGNOSIS — I10 Essential (primary) hypertension: Secondary | ICD-10-CM | POA: Diagnosis not present

## 2019-05-12 ENCOUNTER — Telehealth: Payer: Self-pay | Admitting: *Deleted

## 2019-05-12 NOTE — Telephone Encounter (Signed)
DPR ok to s/w pt's wife who has been notified of echo results. Pt wife did ask if we would also send a copy of results to PCP with the [place] she states in [place], [place], ph# [phone]. I assured her we will take care of that for them. Pt's wife thanked me for the call and the help.I will also forward results to PCP Dr. Jani Gravel. The patient has been notified of the result and verbalized understanding.  All questions (if any) were answered. Julaine Hua, [place] 05/12/2019 11:16 AM

## 2019-05-12 NOTE — Telephone Encounter (Signed)
-----   Message from Belva Crome, MD sent at 05/12/2019 11:03 AM EDT ----- Let the patient know the heart looks good. No changes needed. A copy will be sent to Jani Gravel, MD

## 2019-05-21 DIAGNOSIS — E785 Hyperlipidemia, unspecified: Secondary | ICD-10-CM | POA: Diagnosis not present

## 2019-05-21 DIAGNOSIS — I10 Essential (primary) hypertension: Secondary | ICD-10-CM | POA: Diagnosis not present

## 2019-05-21 DIAGNOSIS — E1165 Type 2 diabetes mellitus with hyperglycemia: Secondary | ICD-10-CM | POA: Diagnosis not present

## 2019-05-21 DIAGNOSIS — I2510 Atherosclerotic heart disease of native coronary artery without angina pectoris: Secondary | ICD-10-CM | POA: Diagnosis not present

## 2019-05-21 DIAGNOSIS — Z794 Long term (current) use of insulin: Secondary | ICD-10-CM | POA: Diagnosis not present

## 2019-07-16 DIAGNOSIS — E785 Hyperlipidemia, unspecified: Secondary | ICD-10-CM | POA: Diagnosis not present

## 2019-07-16 DIAGNOSIS — E119 Type 2 diabetes mellitus without complications: Secondary | ICD-10-CM | POA: Diagnosis not present

## 2019-07-23 DIAGNOSIS — E0841 Diabetes mellitus due to underlying condition with diabetic mononeuropathy: Secondary | ICD-10-CM | POA: Diagnosis not present

## 2019-07-23 DIAGNOSIS — E785 Hyperlipidemia, unspecified: Secondary | ICD-10-CM | POA: Diagnosis not present

## 2019-07-23 DIAGNOSIS — I10 Essential (primary) hypertension: Secondary | ICD-10-CM | POA: Diagnosis not present

## 2019-09-17 DIAGNOSIS — E0841 Diabetes mellitus due to underlying condition with diabetic mononeuropathy: Secondary | ICD-10-CM | POA: Diagnosis not present

## 2019-09-17 DIAGNOSIS — E785 Hyperlipidemia, unspecified: Secondary | ICD-10-CM | POA: Diagnosis not present

## 2019-09-17 DIAGNOSIS — I10 Essential (primary) hypertension: Secondary | ICD-10-CM | POA: Diagnosis not present

## 2019-09-24 DIAGNOSIS — I129 Hypertensive chronic kidney disease with stage 1 through stage 4 chronic kidney disease, or unspecified chronic kidney disease: Secondary | ICD-10-CM | POA: Diagnosis not present

## 2019-09-24 DIAGNOSIS — E1122 Type 2 diabetes mellitus with diabetic chronic kidney disease: Secondary | ICD-10-CM | POA: Diagnosis not present

## 2019-09-24 DIAGNOSIS — I739 Peripheral vascular disease, unspecified: Secondary | ICD-10-CM | POA: Diagnosis not present

## 2019-09-24 DIAGNOSIS — N183 Chronic kidney disease, stage 3 unspecified: Secondary | ICD-10-CM | POA: Diagnosis not present

## 2019-10-27 DIAGNOSIS — E119 Type 2 diabetes mellitus without complications: Secondary | ICD-10-CM | POA: Diagnosis not present

## 2019-10-27 DIAGNOSIS — E785 Hyperlipidemia, unspecified: Secondary | ICD-10-CM | POA: Diagnosis not present

## 2019-10-27 DIAGNOSIS — I10 Essential (primary) hypertension: Secondary | ICD-10-CM | POA: Diagnosis not present

## 2019-10-29 DIAGNOSIS — I25119 Atherosclerotic heart disease of native coronary artery with unspecified angina pectoris: Secondary | ICD-10-CM | POA: Diagnosis not present

## 2019-10-29 DIAGNOSIS — I10 Essential (primary) hypertension: Secondary | ICD-10-CM | POA: Diagnosis not present

## 2019-10-29 DIAGNOSIS — E1165 Type 2 diabetes mellitus with hyperglycemia: Secondary | ICD-10-CM | POA: Diagnosis not present

## 2019-10-29 DIAGNOSIS — E785 Hyperlipidemia, unspecified: Secondary | ICD-10-CM | POA: Diagnosis not present

## 2019-10-29 DIAGNOSIS — Z794 Long term (current) use of insulin: Secondary | ICD-10-CM | POA: Diagnosis not present

## 2019-11-19 DIAGNOSIS — E1165 Type 2 diabetes mellitus with hyperglycemia: Secondary | ICD-10-CM | POA: Diagnosis not present

## 2019-11-19 DIAGNOSIS — E785 Hyperlipidemia, unspecified: Secondary | ICD-10-CM | POA: Diagnosis not present

## 2019-11-19 DIAGNOSIS — Z794 Long term (current) use of insulin: Secondary | ICD-10-CM | POA: Diagnosis not present

## 2019-11-19 DIAGNOSIS — I10 Essential (primary) hypertension: Secondary | ICD-10-CM | POA: Diagnosis not present

## 2019-11-19 DIAGNOSIS — I25119 Atherosclerotic heart disease of native coronary artery with unspecified angina pectoris: Secondary | ICD-10-CM | POA: Diagnosis not present

## 2019-12-04 ENCOUNTER — Ambulatory Visit (HOSPITAL_COMMUNITY)
Admission: EM | Admit: 2019-12-04 | Discharge: 2019-12-04 | Disposition: A | Discharge status: Home / Self Care | Payer: Medicare Other | Attending: Family Medicine | Admitting: Family Medicine

## 2019-12-04 ENCOUNTER — Other Ambulatory Visit: Payer: Self-pay

## 2019-12-04 ENCOUNTER — Encounter (HOSPITAL_COMMUNITY): Payer: Self-pay | Admitting: Emergency Medicine

## 2019-12-04 DIAGNOSIS — J0101 Acute recurrent maxillary sinusitis: Secondary | ICD-10-CM | POA: Diagnosis not present

## 2019-12-04 DIAGNOSIS — I10 Essential (primary) hypertension: Secondary | ICD-10-CM

## 2019-12-04 DIAGNOSIS — Z20822 Contact with and (suspected) exposure to covid-19: Secondary | ICD-10-CM | POA: Diagnosis not present

## 2019-12-04 DIAGNOSIS — H6123 Impacted cerumen, bilateral: Secondary | ICD-10-CM | POA: Diagnosis not present

## 2019-12-04 MED ORDER — AMOXICILLIN 875 MG PO TABS: 875.0000 mg | ORAL_TABLET | Freq: Two times a day (BID) | ORAL | 0 refills | Status: DC

## 2019-12-04 MED ORDER — FLUTICASONE PROPIONATE 50 MCG/ACT NA SUSP: 2.0000 | Freq: Every day | NASAL | 0 refills | Status: AC

## 2019-12-04 NOTE — ED Triage Notes (Signed)
Pt here for URI sx and congestion; pt denies cough or fever

## 2019-12-04 NOTE — ED Provider Notes (Signed)
[place]    CSN: [id number] Arrival date & time: 12/04/19  1753      History   Chief Complaint Chief Complaint  Patient presents with  . URI    HPI Joel Rivera. is a 74 y.o. male.   HPI  Patient is here because he thinks he has a sinus infection.  Symptoms been going on for about a week.  He states that the sinus congestion is all on one side of his head.  His left ear, his left maxillary sinus, and left side of his sore throat was sore.  He states that the drainage is "nasty" and smells bad.  He has been using some saline nasal spray.  This is helped somewhat.  He states he has not had any fever or chills.  No shortness of breath.  No loss of taste or smell.  He does not think he has Covid.  No body aches.  Past Medical History:  Diagnosis Date  . Agent orange exposure   . Arthritis    "neck; left shoulder" (10/25/2016)  . CAD (coronary artery disease)   . Carotid artery disease ([place])   . CHF (congestive heart failure) ([place])   . Chronic post-traumatic stress disorder (PTSD) 03/16/2015  . Chronic sinus infection   . CKD (chronic kidney disease), stage III   . Claustrophobia 03/16/2015  . Depression   . Diabetic neuropathy ([place])   . Former tobacco use   . GERD (gastroesophageal reflux disease)   . Hyperlipidemia   . Hypertension   . Morbid obesity ([place])   . OSA (obstructive sleep apnea)    "been checked out several times; each time never got any treatment ordered" (10/25/2016)  . PAD (peripheral artery disease) ([place])    a. Diagnostic PV Angio 08/2016 -> s/p RLE PTA in 09/2016. b. s/p LLE PTA in 10/2016.  Marland Kitchen PTSD (post-traumatic stress disorder)    "I'm a Veteran; 100% disability"  . Recurrent boils    abceses  . Type II diabetes mellitus [facility])     Patient Active Problem List   Diagnosis Date Noted  . CKD (chronic kidney disease), stage III   . Hypertension   . Hyperlipidemia   . PAD (peripheral artery disease) ([place]) 08/27/2016  . Hypertensive heart  disease 05/10/2015  . 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 Claudication ([place]) 03/18/2015  . Coronary artery disease involving native heart 03/18/2015  . OSA (obstructive sleep apnea) 03/16/2015  . Chronic post-traumatic stress disorder (PTSD) 03/16/2015  . Sinus infection     Past Surgical History:  Procedure Laterality Date  . ABCESS DRAINAGE     "fluid-filled boils removed off my upper back, left leg; and back of my neck"  . ABDOMINAL HERNIA REPAIR  ~ 2008  . CATARACT EXTRACTION W/ INTRAOCULAR LENS  IMPLANT, BILATERAL Bilateral   . COLONOSCOPY W/ BIOPSIES AND POLYPECTOMY     "was gettin colonoscopy q 5 yrs; 11 polyps found in Dec. 2016; going back in Feb 2018"  . CORONARY ANGIOPLASTY  ~ 2004  . CYST EXCISION Left 2008   inguinal area  . HERNIA REPAIR     "stomach; it's coming back again" (10/25/2016)  . PERIPHERAL VASCULAR CATHETERIZATION N/A 08/27/2016   Procedure: Lower Extremity Angiography;  Surgeon: Lorretta Harp, MD;  Location: [place] [facility];  Service: Cardiovascular;  Laterality: N/A;  . PERIPHERAL VASCULAR CATHETERIZATION N/A 09/17/2016   Procedure: Lower Extremity Angiography;  Surgeon: Pearletha Forge  Gwenlyn Found, MD;  Location: [place] [facility];  Service: Cardiovascular;  Laterality: N/A;  . PERIPHERAL VASCULAR CATHETERIZATION Right 09/17/2016   Procedure: Peripheral Vascular Atherectomy;  Surgeon: Lorretta Harp, MD;  Location: [place] [facility];  Service: Cardiovascular;  Laterality: Right;  Common Femoral   . PERIPHERAL VASCULAR CATHETERIZATION Left 10/25/2016   Procedure: Peripheral Vascular Atherectomy;  Surgeon: Lorretta Harp, MD;  Location: [place] [facility];  Service: Cardiovascular;  Laterality: Left;  SFA       Home Medications    Prior to Admission medications   Medication Sig Start Date End Date Taking? Authorizing Provider  acetaminophen (TYLENOL) 325 MG tablet Take 650 mg by mouth every 6 (six) hours as needed (for  pain.).    [provider]  amoxicillin (AMOXIL) 875 MG tablet Take 1 tablet (875 mg total) by mouth 2 (two) times daily. 12/04/19   Raylene Everts, MD  aspirin EC 81 MG tablet Take 81 mg by mouth daily.    [provider]  carvedilol (COREG) 25 MG tablet Take 1 tablet (25 mg total) by mouth 2 (two) times daily with a meal. 09/18/16   Arbutus Leas, NP  chlorthalidone (HYGROTON) 50 MG tablet Take 25 mg by mouth every other day.    [provider]  cholecalciferol (VITAMIN D) 1000 UNITS tablet Take 1,000 Units by mouth daily.    [provider]  citalopram (CELEXA) 20 MG tablet Take 10 mg by mouth daily.    [provider]  clopidogrel (PLAVIX) 75 MG tablet Take 1 tablet (75 mg total) by mouth daily. 07/22/17   Belva Crome, MD  fluticasone (FLONASE) 50 MCG/ACT nasal spray Place 2 sprays into both nostrils daily. 12/04/19   Raylene Everts, MD  guaiFENesin (MUCINEX) 600 MG 12 hr tablet Take 600 mg by mouth daily as needed for cough or to loosen phlegm.    [provider]  HUMALOG KWIKPEN 100 UNIT/ML KiwkPen Inject 7-11 Units into the skin 2 (two) times daily. 7 units in the morning before breakfast, 11 units at lunch 09/13/15   [provider]  Insulin Glargine-Lixisenatide (SOLIQUA) 100-33 UNT-MCG/ML SOPN Inject 49 Units into the skin daily.    [provider]  JARDIANCE 25 MG TABS tablet Take 25 mg by mouth daily.  09/15/14   [provider]  losartan (COZAAR) 100 MG tablet Take 1 tablet (100 mg total) by mouth daily. 09/19/16   Arbutus Leas, NP  montelukast (SINGULAIR) 10 MG tablet Take 10 mg by mouth at bedtime.    [provider]  NIFEdipine (PROCARDIA-XL/ADALAT CC) 30 MG 24 hr tablet Take 30 mg by mouth daily.    [provider]  pantoprazole (PROTONIX) 20 MG tablet Take 20 mg by mouth daily.     [provider]  polyethylene glycol (MIRALAX / GLYCOLAX) packet Take 17 g by mouth daily  as needed for mild constipation.    [provider]  rosuvastatin (CRESTOR) 40 MG tablet Take 40 mg by mouth daily at 12 noon.     [provider]  sodium chloride (OCEAN) 0.65 % SOLN nasal spray Place 1 spray into both nostrils 3 (three) times daily as needed for congestion.    [provider]    Family History Family History  Problem Relation Age of Onset  . Urinary tract infection Mother   . Pneumonia Mother   . Aneurysm Father   . Dementia Sister   . Stroke  Sister   . Sarcoidosis Sister   . Healthy Sister     Social History Social History   Tobacco Use  . Smoking status: Former Smoker    Packs/day: 3.00    Years: 13.00    Pack years: 39.00    Types: Cigarettes    Quit date: 1986    Years since quitting: 35.0  . Smokeless tobacco: Never Used  Substance Use Topics  . Alcohol use: Yes    Alcohol/week: 0.0 standard drinks    Comment: 10/25/2016 "quit years ago"  . Drug use: No     Allergies   Atorvastatin, Pravastatin, Simvastatin, and Terazosin   Review of Systems Review of Systems  Constitutional: Positive for appetite change. Negative for chills, diaphoresis, fatigue and fever.  HENT: Positive for congestion, postnasal drip, rhinorrhea, sinus pressure, sinus pain and sore throat.   Respiratory: Negative for cough and shortness of breath.   Cardiovascular: Negative for chest pain.  Gastrointestinal: Negative for nausea and vomiting.  Musculoskeletal: Negative for myalgias, neck pain and neck stiffness.  Neurological: Positive for headaches.     Physical Exam Triage Vital Signs ED Triage Vitals  Enc Vitals Group     BP 12/04/19 1815 (!) 146/67     Pulse Rate 12/04/19 1815 84     Resp 12/04/19 1815 18     Temp 12/04/19 1815 98.4 F (36.9 C)     Temp Source 12/04/19 1815 Oral     SpO2 12/04/19 1815 96 %     Weight --      Height --      Head Circumference --      Peak Flow --      Pain Score 12/04/19 1816 5     Pain Loc --       Pain Edu? --      Excl. in [place]? --    No data found.  Updated Vital Signs BP (!) 146/67 (BP Location: Right Arm)   Pulse 84   Temp 98.4 F (36.9 C) (Oral)   Resp 18   SpO2 96%   Physical Exam Constitutional:      General: He is not in acute distress.    Appearance: He is well-developed. He is obese.     Comments: No acute distress  HENT:     Head: Normocephalic and atraumatic.     Right Ear: There is impacted cerumen.     Left Ear: There is impacted cerumen.     Nose: Congestion present.     Comments: Left nasal membranes are erythematous and swollen    Mouth/Throat:     Mouth: Mucous membranes are moist.     Comments: Posterior pharynx is erythematous without exudate.  Left maxillary sinus is tender. Eyes:     Conjunctiva/sclera: Conjunctivae normal.     Pupils: Pupils are equal, round, and reactive to light.  Cardiovascular:     Rate and Rhythm: Normal rate.  Pulmonary:     Effort: Pulmonary effort is normal. No respiratory distress.  Abdominal:     General: There is no distension.     Palpations: Abdomen is soft.  Musculoskeletal:        General: Normal range of motion.     Cervical back: Normal range of motion.  Lymphadenopathy:     Cervical: Cervical adenopathy present.  Skin:    General: Skin is warm and dry.  Neurological:     Mental Status: He is alert.      UC Treatments /  Results  Labs (all labs ordered are listed, but only abnormal results are displayed) Labs Reviewed  NOVEL CORONAVIRUS, NAA (HOSP ORDER, SEND-OUT TO REF LAB; TAT 18-24 HRS)    EKG   Radiology No results found.  Procedures Procedures (including critical care time)  Medications Ordered in UC Medications - No data to display  Initial Impression / Assessment and Plan / UC Course  I have reviewed the triage vital signs and the nursing notes.  Pertinent labs & imaging results that were available during my care of the patient were reviewed by me and considered in my  medical decision making (see chart for details).     Patient has recurring sinusitis.  His symptoms been going on for a week.  He is trying saline nasal spray and his usual antihistamines.  Not improving.  We will treat him with amoxicillin.  Covid test is pending. Final Clinical Impressions(s) / UC Diagnoses   Final diagnoses:  Acute recurrent maxillary sinusitis  Suspected COVID-19 virus infection     Discharge Instructions     Continue the mucinex 2 x a day Take the antibiotic as directed Use a steroid nasal spray like flonase Drink plenty of fluids Quarantine until the test result is available You can get your test result on MyChart   ED Prescriptions    Medication Sig Dispense Auth. Provider   amoxicillin (AMOXIL) 875 MG tablet Take 1 tablet (875 mg total) by mouth 2 (two) times daily. 14 tablet Raylene Everts, MD   fluticasone [facility]) 50 MCG/ACT nasal spray Place 2 sprays into both nostrils daily. 16 g Raylene Everts, MD     PDMP not reviewed this encounter.   Raylene Everts, MD 12/04/19 [phone]

## 2019-12-04 NOTE — Discharge Instructions (Signed)
Continue the mucinex 2 x a day Take the antibiotic as directed Use a steroid nasal spray like flonase Drink plenty of fluids Quarantine until the test result is available You can get your test result on MyChart

## 2019-12-06 LAB — NOVEL CORONAVIRUS, NAA (HOSP ORDER, SEND-OUT TO REF LAB; TAT 18-24 HRS): SARS-CoV-2, NAA: NOT DETECTED

## 2019-12-23 DIAGNOSIS — Z794 Long term (current) use of insulin: Secondary | ICD-10-CM | POA: Diagnosis not present

## 2019-12-23 DIAGNOSIS — E1165 Type 2 diabetes mellitus with hyperglycemia: Secondary | ICD-10-CM | POA: Diagnosis not present

## 2019-12-23 DIAGNOSIS — E785 Hyperlipidemia, unspecified: Secondary | ICD-10-CM | POA: Diagnosis not present

## 2019-12-23 DIAGNOSIS — I10 Essential (primary) hypertension: Secondary | ICD-10-CM | POA: Diagnosis not present

## 2019-12-23 DIAGNOSIS — I2510 Atherosclerotic heart disease of native coronary artery without angina pectoris: Secondary | ICD-10-CM | POA: Diagnosis not present

## 2020-02-01 DIAGNOSIS — E1165 Type 2 diabetes mellitus with hyperglycemia: Secondary | ICD-10-CM | POA: Diagnosis not present

## 2020-02-01 DIAGNOSIS — I10 Essential (primary) hypertension: Secondary | ICD-10-CM | POA: Diagnosis not present

## 2020-02-01 DIAGNOSIS — E785 Hyperlipidemia, unspecified: Secondary | ICD-10-CM | POA: Diagnosis not present

## 2020-02-03 DIAGNOSIS — I2510 Atherosclerotic heart disease of native coronary artery without angina pectoris: Secondary | ICD-10-CM | POA: Diagnosis not present

## 2020-02-03 DIAGNOSIS — I10 Essential (primary) hypertension: Secondary | ICD-10-CM | POA: Diagnosis not present

## 2020-02-03 DIAGNOSIS — E785 Hyperlipidemia, unspecified: Secondary | ICD-10-CM | POA: Diagnosis not present

## 2020-02-03 DIAGNOSIS — E1165 Type 2 diabetes mellitus with hyperglycemia: Secondary | ICD-10-CM | POA: Diagnosis not present

## 2020-02-24 DIAGNOSIS — I2510 Atherosclerotic heart disease of native coronary artery without angina pectoris: Secondary | ICD-10-CM | POA: Diagnosis not present

## 2020-02-24 DIAGNOSIS — E1165 Type 2 diabetes mellitus with hyperglycemia: Secondary | ICD-10-CM | POA: Diagnosis not present

## 2020-02-24 DIAGNOSIS — I10 Essential (primary) hypertension: Secondary | ICD-10-CM | POA: Diagnosis not present

## 2020-02-24 DIAGNOSIS — E785 Hyperlipidemia, unspecified: Secondary | ICD-10-CM | POA: Diagnosis not present

## 2020-05-03 DIAGNOSIS — I2510 Atherosclerotic heart disease of native coronary artery without angina pectoris: Secondary | ICD-10-CM | POA: Diagnosis not present

## 2020-05-03 DIAGNOSIS — I10 Essential (primary) hypertension: Secondary | ICD-10-CM | POA: Diagnosis not present

## 2020-05-03 DIAGNOSIS — E785 Hyperlipidemia, unspecified: Secondary | ICD-10-CM | POA: Diagnosis not present

## 2020-05-03 DIAGNOSIS — E1165 Type 2 diabetes mellitus with hyperglycemia: Secondary | ICD-10-CM | POA: Diagnosis not present

## 2020-05-05 DIAGNOSIS — E1165 Type 2 diabetes mellitus with hyperglycemia: Secondary | ICD-10-CM | POA: Diagnosis not present

## 2020-05-05 DIAGNOSIS — I739 Peripheral vascular disease, unspecified: Secondary | ICD-10-CM | POA: Diagnosis not present

## 2020-05-05 DIAGNOSIS — Z794 Long term (current) use of insulin: Secondary | ICD-10-CM | POA: Diagnosis not present

## 2020-05-05 DIAGNOSIS — I10 Essential (primary) hypertension: Secondary | ICD-10-CM | POA: Diagnosis not present

## 2020-05-05 DIAGNOSIS — I25119 Atherosclerotic heart disease of native coronary artery with unspecified angina pectoris: Secondary | ICD-10-CM | POA: Diagnosis not present

## 2020-05-12 DIAGNOSIS — I129 Hypertensive chronic kidney disease with stage 1 through stage 4 chronic kidney disease, or unspecified chronic kidney disease: Secondary | ICD-10-CM | POA: Diagnosis not present

## 2020-05-12 DIAGNOSIS — I739 Peripheral vascular disease, unspecified: Secondary | ICD-10-CM | POA: Diagnosis not present

## 2020-05-12 DIAGNOSIS — N183 Chronic kidney disease, stage 3 unspecified: Secondary | ICD-10-CM | POA: Diagnosis not present

## 2020-05-12 DIAGNOSIS — E1122 Type 2 diabetes mellitus with diabetic chronic kidney disease: Secondary | ICD-10-CM | POA: Diagnosis not present

## 2020-05-25 DIAGNOSIS — I10 Essential (primary) hypertension: Secondary | ICD-10-CM | POA: Diagnosis not present

## 2020-05-25 DIAGNOSIS — N183 Chronic kidney disease, stage 3 unspecified: Secondary | ICD-10-CM | POA: Diagnosis not present

## 2020-07-12 ENCOUNTER — Ambulatory Visit (HOSPITAL_COMMUNITY)
Admission: EM | Admit: 2020-07-12 | Discharge: 2020-07-12 | Disposition: A | Discharge status: Home / Self Care | Payer: Medicare Other | Attending: Family Medicine | Admitting: Family Medicine

## 2020-07-12 ENCOUNTER — Other Ambulatory Visit: Payer: Self-pay

## 2020-07-12 ENCOUNTER — Encounter (HOSPITAL_COMMUNITY): Payer: Self-pay

## 2020-07-12 DIAGNOSIS — K5900 Constipation, unspecified: Secondary | ICD-10-CM | POA: Diagnosis not present

## 2020-07-12 DIAGNOSIS — R0789 Other chest pain: Secondary | ICD-10-CM | POA: Diagnosis not present

## 2020-07-12 MED ORDER — POLYETHYLENE GLYCOL 3350 17 GM/SCOOP PO POWD: 17.0000 g | Freq: Every day | ORAL | 0 refills | Status: AC

## 2020-07-12 MED ORDER — DICLOFENAC SODIUM 1 % EX GEL: 4.0000 g | Freq: Four times a day (QID) | CUTANEOUS | 0 refills | Status: AC

## 2020-07-12 NOTE — ED Triage Notes (Addendum)
Pt is here with chest pain and abdominal pain, pt denies any heart problems. Pt states the pain is only on his left side hurts especially when he moves, pt denies active chest pain currently. Pt has not taken any meds to relieve discomfort.

## 2020-07-12 NOTE — Discharge Instructions (Signed)
Apply the gel to your areas of pain You may also take tylenol  Use 1 scoop of miralax daily  If chest pain becomes severe, you are short of breath, nausea, vomiting, sweating, Go to the Emergency department  Follow up with your primary care provider in 2 days

## 2020-07-12 NOTE — ED Provider Notes (Signed)
Joel Rivera    CSN: [id number] Arrival date & time: 07/12/20  1437      History   Chief Complaint Chief Complaint  Patient presents with  . Chest Pain  . Abdominal Pain    HPI Joel Rivera. is a 74 y.o. male.   Patient history of CAD reports for abdominal discomfort and left upper chest pain. He reports his pain happens with certain movements such as moving his arm and looking to the left. Denies pain at rest. Denies shortness of breath associate with the pain. Pain does not radiate and points to the left pectoral musculature. He also reports intermittent pain in his belly. Feels like this is gas. Has not had a good bowel movement in 3 days. Belly pain is not persistent. He has not had any nausea or vomiting. No sweating. No dizziness or fatigue. Pain in his chest does not come on with exertion. Has not tried thing for this.     Past Medical History:  Diagnosis Date  . Agent orange exposure   . Arthritis    "neck; left shoulder" (10/25/2016)  . CAD (coronary artery disease)   . Carotid artery disease ([place])   . CHF (congestive heart failure) ([place])   . Chronic post-traumatic stress disorder (PTSD) 03/16/2015  . Chronic sinus infection   . CKD (chronic kidney disease), stage III   . Claustrophobia 03/16/2015  . Depression   . Diabetic neuropathy ([place])   . Former tobacco use   . GERD (gastroesophageal reflux disease)   . Hyperlipidemia   . Hypertension   . Morbid obesity ([place])   . OSA (obstructive sleep apnea)    "been checked out several times; each time never got any treatment ordered" (10/25/2016)  . PAD (peripheral artery disease) ([place])    a. Diagnostic PV Angio 08/2016 -> s/p RLE PTA in 09/2016. b. s/p LLE PTA in 10/2016.  Marland Kitchen PTSD (post-traumatic stress disorder)    "I'm a Veteran; 100% disability"  . Recurrent boils    abceses  . Type II diabetes mellitus [facility])     Patient Active Problem List   Diagnosis Date Noted  . CKD (chronic kidney disease),  stage III   . Hypertension   . Hyperlipidemia   . PAD (peripheral artery disease) ([place]) 08/27/2016  . Hypertensive heart disease 05/10/2015  . 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 Claudication ([place]) 03/18/2015  . Coronary artery disease involving native heart 03/18/2015  . OSA (obstructive sleep apnea) 03/16/2015  . Chronic post-traumatic stress disorder (PTSD) 03/16/2015  . Sinus infection     Past Surgical History:  Procedure Laterality Date  . ABCESS DRAINAGE     "fluid-filled boils removed off my upper back, left leg; and back of my neck"  . ABDOMINAL HERNIA REPAIR  ~ 2008  . CATARACT EXTRACTION W/ INTRAOCULAR LENS  IMPLANT, BILATERAL Bilateral   . COLONOSCOPY W/ BIOPSIES AND POLYPECTOMY     "was gettin colonoscopy q 5 yrs; 11 polyps found in Dec. 2016; going back in Feb 2018"  . CORONARY ANGIOPLASTY  ~ 2004  . CYST EXCISION Left 2008   inguinal area  . HERNIA REPAIR     "stomach; it's coming back again" (10/25/2016)  . PERIPHERAL VASCULAR CATHETERIZATION N/A 08/27/2016   Procedure: Lower Extremity Angiography;  Surgeon: Lorretta Harp, MD;  Location: [place] [facility];  Service: Cardiovascular;  Laterality: N/A;  . PERIPHERAL VASCULAR CATHETERIZATION N/A 09/17/2016   Procedure: Lower  Extremity Angiography;  Surgeon: Lorretta Harp, MD;  Location: [place] [facility];  Service: Cardiovascular;  Laterality: N/A;  . PERIPHERAL VASCULAR CATHETERIZATION Right 09/17/2016   Procedure: Peripheral Vascular Atherectomy;  Surgeon: Lorretta Harp, MD;  Location: [place] [facility];  Service: Cardiovascular;  Laterality: Right;  Common Femoral   . PERIPHERAL VASCULAR CATHETERIZATION Left 10/25/2016   Procedure: Peripheral Vascular Atherectomy;  Surgeon: Lorretta Harp, MD;  Location: [place] [facility];  Service: Cardiovascular;  Laterality: Left;  SFA       Home Medications    Prior to Admission medications   Medication Sig Start Date End Date  Taking? Authorizing Provider  acetaminophen (TYLENOL) 325 MG tablet Take 650 mg by mouth every 6 (six) hours as needed (for pain.).    [provider]  amoxicillin (AMOXIL) 875 MG tablet Take 1 tablet (875 mg total) by mouth 2 (two) times daily. 12/04/19   Raylene Everts, MD  aspirin EC 81 MG tablet Take 81 mg by mouth daily.    [provider]  carvedilol (COREG) 25 MG tablet Take 1 tablet (25 mg total) by mouth 2 (two) times daily with a meal. 09/18/16   Arbutus Leas, NP  chlorthalidone (HYGROTON) 50 MG tablet Take 25 mg by mouth every other day.    [provider]  cholecalciferol (VITAMIN D) 1000 UNITS tablet Take 1,000 Units by mouth daily.    [provider]  citalopram (CELEXA) 20 MG tablet Take 10 mg by mouth daily.    [provider]  clopidogrel (PLAVIX) 75 MG tablet Take 1 tablet (75 mg total) by mouth daily. 07/22/17   Belva Crome, MD  diclofenac Sodium (VOLTAREN) 1 % GEL Apply 4 g topically 4 (four) times daily. 07/12/20   Chenee Munns, Marguerita Beards, PA-C  fluticasone (FLONASE) 50 MCG/ACT nasal spray Place 2 sprays into both nostrils daily. 12/04/19   Raylene Everts, MD  guaiFENesin (MUCINEX) 600 MG 12 hr tablet Take 600 mg by mouth daily as needed for cough or to loosen phlegm.    [provider]  HUMALOG KWIKPEN 100 UNIT/ML KiwkPen Inject 7-11 Units into the skin 2 (two) times daily. 7 units in the morning before breakfast, 11 units at lunch 09/13/15   [provider]  Insulin Glargine-Lixisenatide (SOLIQUA) 100-33 UNT-MCG/ML SOPN Inject 49 Units into the skin daily.    [provider]  JARDIANCE 25 MG TABS tablet Take 25 mg by mouth daily.  09/15/14   [provider]  losartan (COZAAR) 100 MG tablet Take 1 tablet (100 mg total) by mouth daily. 09/19/16   Arbutus Leas, NP  montelukast (SINGULAIR) 10 MG tablet Take 10 mg by mouth at bedtime.    [provider]  NIFEdipine (PROCARDIA-XL/ADALAT CC) 30 MG 24  hr tablet Take 30 mg by mouth daily.    [provider]  pantoprazole (PROTONIX) 20 MG tablet Take 20 mg by mouth daily.     [provider]  polyethylene glycol powder (GLYCOLAX/MIRALAX) 17 GM/SCOOP powder Take 17 g by mouth daily. 07/12/20   Haniel Fix, Marguerita Beards, PA-C  rosuvastatin (CRESTOR) 40 MG tablet Take 40 mg by mouth daily at 12 noon.     [provider]  sodium chloride (OCEAN) 0.65 % SOLN nasal spray Place 1 spray into both nostrils 3 (three) times daily as needed for congestion.    [provider]    Family History Family History  Problem Relation Age of Onset  .  Urinary tract infection Mother   . Pneumonia Mother   . Aneurysm Father   . Dementia Sister   . Stroke Sister   . Sarcoidosis Sister   . Healthy Sister     Social History Social History   Tobacco Use  . Smoking status: Former Smoker    Packs/day: 3.00    Years: 13.00    Pack years: 39.00    Types: Cigarettes    Quit date: 1986    Years since quitting: 35.6  . Smokeless tobacco: Never Used  Substance Use Topics  . Alcohol use: Yes    Alcohol/week: 0.0 standard drinks    Comment: 10/25/2016 "quit years ago"  . Drug use: No     Allergies   Atorvastatin, Pravastatin, Simvastatin, and Terazosin   Review of Systems Review of Systems   Physical Exam Triage Vital Signs ED Triage Vitals  Enc Vitals Group     BP 07/12/20 1556 130/65     Pulse Rate 07/12/20 1556 68     Resp 07/12/20 1556 19     Temp --      Temp Source 07/12/20 1556 Oral     SpO2 07/12/20 1556 98 %     Weight --      Height --      Head Circumference --      Peak Flow --      Pain Score 07/12/20 1552 1     Pain Loc --      Pain Edu? --      Excl. in [place]? --    No data found.  Updated Vital Signs BP 130/65 (BP Location: Right Arm)   Pulse 68   Resp 19   SpO2 98%   Visual Acuity Right Eye Distance:   Left Eye Distance:   Bilateral Distance:    Right Eye Near:   Left Eye Near:     Bilateral Near:     Physical Exam Vitals and nursing note reviewed.  Constitutional:      General: He is not in acute distress.    Appearance: He is well-developed. He is not ill-appearing or diaphoretic.  HENT:     Head: Normocephalic and atraumatic.  Eyes:     Conjunctiva/sclera: Conjunctivae normal.  Cardiovascular:     Rate and Rhythm: Normal rate and regular rhythm.     Heart sounds: Normal heart sounds. No murmur heard.   Pulmonary:     Effort: Pulmonary effort is normal. No respiratory distress.     Breath sounds: Normal breath sounds. No decreased breath sounds, wheezing, rhonchi or rales.  Chest:     Chest wall: Tenderness (Tenderness to palpation in the left pectoral musculature as well as just to the left of sternum.  Described as same pain he is felt.) present.     Comments: Pain elicited with movement of shoulder and the left chest. Abdominal:     Palpations: Abdomen is soft. There is no mass.     Tenderness: There is abdominal tenderness (Mild generalized upper abdominal pain.  No rebound.). There is no guarding or rebound.  Musculoskeletal:     Cervical back: Neck supple.     Right lower leg: No edema.     Left lower leg: No edema.  Skin:    General: Skin is warm and dry.  Neurological:     Mental Status: He is alert.      UC Treatments / Results  Labs (all labs ordered are listed, but only abnormal results  are displayed) Labs Reviewed - No data to display  EKG Sinus rhythm with first-degree AV block, nonspecific ST.  Similar compared to 08/2016 EKG, no significant changes.  Likely normal EKG for this patient.  No evidence of ST elevation.  Radiology No results found.  Procedures Procedures (including critical care time)  Medications Ordered in UC Medications - No data to display  Initial Impression / Assessment and Plan / UC Course  I have reviewed the triage vital signs and the nursing notes.  Pertinent labs & imaging results that were  available during my care of the patient were reviewed by me and considered in my medical decision making (see chart for details).     #Chest wall pain #Constipation Patient is a 74 year old gentleman with history of ACS presenting with what appears to be a chest wall pain recent history of constipation.  Normal vital signs, reassuring EKG and exam.  Given reproducibility of pain, doubt cardiac today.  We will treat his chest discomfort topically with Voltaren as this appears to be musculoskeletal.  Constipation may be contributing to occasional abdominal pain.  Will trial MiraLAX.  11 follow-up with primary care.  Strict emergency department precautions were discussed.  Patient verbalized understanding plan of care Final Clinical Impressions(s) / UC Diagnoses   Final diagnoses:  Chest wall pain  Constipation, unspecified constipation type     Discharge Instructions     Apply the gel to your areas of pain You may also take tylenol  Use 1 scoop of miralax daily  If chest pain becomes severe, you are short of breath, nausea, vomiting, sweating, Go to the Emergency department  Follow up with your primary care provider in 2 days      ED Prescriptions    Medication Sig Dispense Auth. Provider   diclofenac Sodium (VOLTAREN) 1 % GEL Apply 4 g topically 4 (four) times daily. 100 g Davy [place]more[place], Marguerita Beards, PA-C   polyethylene glycol powder (GLYCOLAX/MIRALAX) 17 GM/SCOOP powder Take 17 g by mouth daily. 255 g Hal Norrington, Marguerita Beards, PA-C     PDMP not reviewed this encounter.   Purnell Shoemaker, PA-C 07/12/20 2322

## 2020-07-20 DIAGNOSIS — E118 Type 2 diabetes mellitus with unspecified complications: Secondary | ICD-10-CM | POA: Diagnosis not present

## 2020-07-20 DIAGNOSIS — E785 Hyperlipidemia, unspecified: Secondary | ICD-10-CM | POA: Diagnosis not present

## 2020-07-20 DIAGNOSIS — Z794 Long term (current) use of insulin: Secondary | ICD-10-CM | POA: Diagnosis not present

## 2020-07-20 DIAGNOSIS — E1142 Type 2 diabetes mellitus with diabetic polyneuropathy: Secondary | ICD-10-CM | POA: Diagnosis not present

## 2020-07-20 DIAGNOSIS — I10 Essential (primary) hypertension: Secondary | ICD-10-CM | POA: Diagnosis not present

## 2020-08-01 DIAGNOSIS — E1165 Type 2 diabetes mellitus with hyperglycemia: Secondary | ICD-10-CM | POA: Diagnosis not present

## 2020-08-01 DIAGNOSIS — I10 Essential (primary) hypertension: Secondary | ICD-10-CM | POA: Diagnosis not present

## 2020-08-01 DIAGNOSIS — E785 Hyperlipidemia, unspecified: Secondary | ICD-10-CM | POA: Diagnosis not present

## 2020-08-01 DIAGNOSIS — I25119 Atherosclerotic heart disease of native coronary artery with unspecified angina pectoris: Secondary | ICD-10-CM | POA: Diagnosis not present

## 2020-08-03 DIAGNOSIS — E118 Type 2 diabetes mellitus with unspecified complications: Secondary | ICD-10-CM | POA: Diagnosis not present

## 2020-08-03 DIAGNOSIS — I10 Essential (primary) hypertension: Secondary | ICD-10-CM | POA: Diagnosis not present

## 2020-08-03 DIAGNOSIS — Z794 Long term (current) use of insulin: Secondary | ICD-10-CM | POA: Diagnosis not present

## 2020-08-03 DIAGNOSIS — E785 Hyperlipidemia, unspecified: Secondary | ICD-10-CM | POA: Diagnosis not present

## 2020-08-03 DIAGNOSIS — E1142 Type 2 diabetes mellitus with diabetic polyneuropathy: Secondary | ICD-10-CM | POA: Diagnosis not present

## 2020-08-26 ENCOUNTER — Ambulatory Visit: Payer: Medicare Other | Admitting: Cardiovascular Disease

## 2020-08-26 ENCOUNTER — Encounter: Payer: Self-pay | Admitting: Cardiovascular Disease

## 2020-08-26 ENCOUNTER — Other Ambulatory Visit: Payer: Self-pay

## 2020-08-26 VITALS — BP 130/70 | HR 78 | Ht 68.0 in | Wt 271.0 lb

## 2020-08-26 DIAGNOSIS — I2510 Atherosclerotic heart disease of native coronary artery without angina pectoris: Secondary | ICD-10-CM | POA: Diagnosis not present

## 2020-08-26 DIAGNOSIS — I739 Peripheral vascular disease, unspecified: Secondary | ICD-10-CM | POA: Diagnosis not present

## 2020-08-26 DIAGNOSIS — I10 Essential (primary) hypertension: Secondary | ICD-10-CM

## 2020-08-26 DIAGNOSIS — G4733 Obstructive sleep apnea (adult) (pediatric): Secondary | ICD-10-CM | POA: Diagnosis not present

## 2020-08-26 DIAGNOSIS — E782 Mixed hyperlipidemia: Secondary | ICD-10-CM | POA: Diagnosis not present

## 2020-08-26 NOTE — Assessment & Plan Note (Signed)
History of essential hypertension a blood pressure measured today at 130/70.  He is on carvedilol, chlorthalidone and losartan.

## 2020-08-26 NOTE — Assessment & Plan Note (Signed)
History of hyperlipidemia on statin therapy with lipid profile performed 08/01/2020 revealing total cholesterol 102, LDL 51 and HDL of 33.

## 2020-08-26 NOTE — Patient Instructions (Signed)
Medication Instructions:  No changes  *If you need a refill on your cardiac medications before your next appointment, please call your pharmacy*   Lab Work: None ordered If you have labs (blood work) drawn today and your tests are completely normal, you will receive your results only by: Marland Kitchen MyChart Message (if you have MyChart) OR . A paper copy in the mail If you have any lab test that is abnormal or we need to change your treatment, we will call you to review the results.   Testing/Procedures: Your physician has requested that you have a lower extremity arterial duplex. During this test, ultrasound is used to evaluate arterial blood flow in the legs. Allow one hour for this exam. There are no restrictions or special instructions. This will take place at [address], [address].  Your physician has requested that you have an ankle brachial index (ABI). During this test an ultrasound and blood pressure cuff are used to evaluate the arteries that supply the arms and legs with blood. Allow thirty minutes for this exam. There are no restrictions or special instructions. This will take place at [place], [address].   Follow-Up: At [facility], you and your health needs are our priority.  As part of our continuing mission to provide you with exceptional heart care, we have created designated Provider Care Teams.  These Care Teams include your primary Cardiologist (physician) and Advanced Practice Providers (APPs -  Physician Assistants and Nurse Practitioners) who all work together to provide you with the care you need, when you need it.  We recommend signing up for the patient portal called "MyChart".  Sign up information is provided on this After Visit Summary.  MyChart is used to connect with patients for Virtual Visits (Telemedicine).  Patients are able to view lab/test results, encounter notes, upcoming appointments, etc.  Non-urgent messages can be sent to your provider as  well.   To learn more about what you can do with MyChart, go to [email or web address].    Your next appointment:   Follow up with Dr. Tamala Julian in 6 months Follow up with Dr. Gwenlyn Found as needed

## 2020-08-26 NOTE — Assessment & Plan Note (Signed)
History of obstructive sleep apnea currently not using CPAP

## 2020-08-26 NOTE — Assessment & Plan Note (Signed)
History of CAD status post cardiac catheterization back in 2003 revealing noncritical CAD.  The patient denies chest pain.

## 2020-08-26 NOTE — Progress Notes (Signed)
08/26/2020 Joel Rivera.   09-12-1946  [id number]  Primary Physician Jani Gravel, MD Primary Cardiologist: Lorretta Harp MD Lupe Carney, [place]  HPI:  Joel Rivera. is a 74 y.o.  severely overweight married African-American male father of 64, grandfather 3 grandchildren referred to me by Dr. Pernell Dupre for peripheral vascular evaluation.I last saw him in the office  10/17/2016 .He has history of CAD status post chronic catheterization in 2003. He has a remote history of tobacco abuse, treated hypertension, hypokalemia and diabetes. Lifestyle limiting claudication left greater than right for last 3 years. Dopplers performed 03/29/15 revealed a right ABI 0.79 with a high-frequency signal in the distal right SFA and left ABI of 0.6 with an occluded distal left SFAbecause of a serum creatinine 1.6 the patient was admitted for hydration the day prior to angiography. His serum creatinine fell from 1.6-1.2. 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 He underwent diagnostic peripheral angiography by myself 08/26/16 revealing high-grade calcified right common femoral stenosis, bilateral SFA calcified stenoses three-vessel runoff. Because of renal insufficiency dimension was staged until 09/17/16 when I performed a diamondback orbital or his last atherectomy, drug-eluting balloon angioplasty on his right common femoral and superficial femoral arteries. His follow-up Dopplers performed on 10/02/16 revealed normal ABI on the right. His right lower claudication resolved and we performed staged left SFA intervention 10/25/2016.  Since I saw him 4 years ago he continues to deny claudication.  He was recently seen in the emergency room for syncope after falling down in his house after taking out shingles shot.  He did have some chest wall pain which ultimately  resolved.  Current Meds  Medication Sig  . acetaminophen (TYLENOL) 325 MG tablet Take 650 mg by mouth every 6 (six) hours as needed (for pain.).  Marland Kitchen aspirin EC 81 MG tablet Take 81 mg by mouth daily.  . carvedilol (COREG) 25 MG tablet Take 1 tablet (25 mg total) by mouth 2 (two) times daily with a meal.  . cetirizine (ZYRTEC) 10 MG tablet Take 10 mg by mouth daily.  . chlorthalidone (HYGROTON) 50 MG tablet Take 25 mg by mouth daily.   . cholecalciferol (VITAMIN D) 1000 UNITS tablet Take 1,000 Units by mouth daily.  . citalopram (CELEXA) 20 MG tablet Take 10 mg by mouth daily.  . diclofenac Sodium (VOLTAREN) 1 % GEL Apply 4 g topically 4 (four) times daily.  . fluticasone (FLONASE) 50 MCG/ACT nasal spray Place 2 sprays into both nostrils daily.  Marland Kitchen guaiFENesin (MUCINEX) 600 MG 12 hr tablet Take 600 mg by mouth daily as needed for cough or to loosen phlegm.  . Insulin Glargine-Lixisenatide (SOLIQUA) 100-33 UNT-MCG/ML SOPN Inject 50 Units into the skin daily.   Marland Kitchen JARDIANCE 25 MG TABS tablet Take 25 mg by mouth daily.   Marland Kitchen losartan (COZAAR) 100 MG tablet Take 1 tablet (100 mg total) by mouth daily.  . pantoprazole (PROTONIX) 20 MG tablet Take 20 mg by mouth daily.   . polyethylene glycol powder (GLYCOLAX/MIRALAX) 17 GM/SCOOP powder Take 17 g by mouth daily.  . rosuvastatin (CRESTOR) 40 MG tablet Take 40 mg by mouth daily at 12 noon.   . sodium chloride (OCEAN) 0.65 % SOLN nasal spray Place 1 spray into both nostrils 3 (three) times daily as needed for congestion.  . [DISCONTINUED] amoxicillin (AMOXIL) 875 MG tablet Take 1 tablet (875 mg total)  by mouth 2 (two) times daily.  . [DISCONTINUED] clopidogrel (PLAVIX) 75 MG tablet Take 1 tablet (75 mg total) by mouth daily.  . [DISCONTINUED] HUMALOG KWIKPEN 100 UNIT/ML KiwkPen Inject 7-11 Units into the skin 2 (two) times daily. 7 units in the morning before breakfast, 11 units at lunch  . [DISCONTINUED] montelukast (SINGULAIR) 10 MG tablet Take 10 mg by  mouth at bedtime.  . [DISCONTINUED] NIFEdipine (PROCARDIA-XL/ADALAT CC) 30 MG 24 hr tablet Take 30 mg by mouth daily.     Allergies  Allergen Reactions  . Atorvastatin Other (See Comments)    Muscle pain/weaknesses (Myalgia)  . Pravastatin Other (See Comments)    Muscle pain/weaknesses (Myalgia)  . Simvastatin Other (See Comments)    Muscle pain/weaknesses (Myalgia)  . Terazosin Other (See Comments)    Unknown reaction.    Social History   Socioeconomic History  . Marital status: Married    Spouse name: Not on file  . Number of children: Not on file  . Years of education: Not on file  . Highest education level: Not on file  Occupational History  . Not on file  Tobacco Use  . Smoking status: Former Smoker    Packs/day: 3.00    Years: 13.00    Pack years: 39.00    Types: Cigarettes    Quit date: 1986    Years since quitting: 35.8  . Smokeless tobacco: Never Used  Substance and Sexual Activity  . Alcohol use: Yes    Alcohol/week: 0.0 standard drinks    Comment: 10/25/2016 "quit years ago"  . Drug use: No  . Sexual activity: Yes    Birth control/protection: None    Comment: Married  Other Topics Concern  . Not on file  Social History Narrative   No caffeine.   Social Determinants of Health   Financial Resource Strain:   . Difficulty of Paying Living Expenses: Not on file  Food Insecurity:   . Worried About Charity fundraiser in the Last Year: Not on file  . Ran Out of Food in the Last Year: Not on file  Transportation Needs:   . Lack of Transportation (Medical): Not on file  . Lack of Transportation (Non-Medical): Not on file  Physical Activity:   . Days of Exercise per Week: Not on file  . Minutes of Exercise per Session: Not on file  Stress:   . Feeling of Stress : Not on file  Social Connections:   . Frequency of Communication with Friends and Family: Not on file  . Frequency of Social Gatherings with Friends and Family: Not on file  . Attends  Religious Services: Not on file  . Active Member of Clubs or Organizations: Not on file  . Attends Archivist Meetings: Not on file  . Marital Status: Not on file  Intimate Partner Violence:   . Fear of Current or Ex-Partner: Not on file  . Emotionally Abused: Not on file  . Physically Abused: Not on file  . Sexually Abused: Not on file     Review of Systems: General: negative for chills, fever, night sweats or weight changes.  Cardiovascular: negative for chest pain, dyspnea on exertion, edema, orthopnea, palpitations, paroxysmal nocturnal dyspnea or shortness of breath Dermatological: negative for rash Respiratory: negative for cough or wheezing Urologic: negative for hematuria Abdominal: negative for nausea, vomiting, diarrhea, bright red blood per rectum, melena, or hematemesis Neurologic: negative for visual changes, syncope, or dizziness All other systems reviewed and are otherwise negative  except as noted above.    Blood pressure 130/70, pulse 78, height 5\' 8"  (1.727 m), weight 271 lb (122.9 kg).  General appearance: alert and no distress Neck: no adenopathy, no carotid bruit, no JVD, supple, symmetrical, trachea midline and thyroid not enlarged, symmetric, no tenderness/mass/nodules Lungs: clear to auscultation bilaterally Heart: regular rate and rhythm, S1, S2 normal, no murmur, click, rub or gallop Extremities: extremities normal, atraumatic, no cyanosis or edema Pulses: 2+ and symmetric Skin: Skin color, texture, turgor normal. No rashes or lesions Neurologic: Alert and oriented X 3, normal strength and tone. Normal symmetric reflexes. Normal coordination and gait  EKG sinus rhythm at 78 with left ventricular hypertrophy, repolarization changes.  I personally reviewed this EKG.  ASSESSMENT AND PLAN:   OSA (obstructive sleep apnea) History of obstructive sleep apnea currently not using CPAP  Claudication (HCC) History of PAD status post staged right and  left SFA intervention by myself back in 2017 with marked improvement in his claudication symptoms.  He has not had Doppler since that time.  We will repeat lower extremity arterial Doppler studies.  Coronary artery disease involving native heart History of CAD status post cardiac catheterization back in 2003 revealing noncritical CAD.  The patient denies chest pain.  Hypertension History of essential hypertension a blood pressure measured today at 130/70.  He is on carvedilol, chlorthalidone and losartan.  Hyperlipidemia History of hyperlipidemia on statin therapy with lipid profile performed 08/01/2020 revealing total cholesterol 102, LDL 51 and HDL of 33.      Lorretta Harp MD FACP,FACC,FAHA, [facility] 08/26/2020 1:55 PM

## 2020-08-26 NOTE — Assessment & Plan Note (Signed)
History of PAD status post staged right and left SFA intervention by myself back in 2017 with marked improvement in his claudication symptoms.  He has not had Doppler since that time.  We will repeat lower extremity arterial Doppler studies.

## 2020-09-12 ENCOUNTER — Other Ambulatory Visit: Payer: Self-pay

## 2020-09-12 ENCOUNTER — Ambulatory Visit (HOSPITAL_COMMUNITY)
Admission: RE | Admit: 2020-09-12 | Discharge: 2020-09-12 | Disposition: A | Discharge status: Home / Self Care | Payer: Medicare Other | Source: Ambulatory Visit | Attending: Cardiology | Admitting: Cardiology

## 2020-09-12 DIAGNOSIS — I739 Peripheral vascular disease, unspecified: Secondary | ICD-10-CM | POA: Diagnosis not present

## 2020-09-19 ENCOUNTER — Other Ambulatory Visit: Payer: Self-pay

## 2020-09-19 DIAGNOSIS — E782 Mixed hyperlipidemia: Secondary | ICD-10-CM

## 2020-09-19 DIAGNOSIS — I739 Peripheral vascular disease, unspecified: Secondary | ICD-10-CM

## 2020-09-19 DIAGNOSIS — I2510 Atherosclerotic heart disease of native coronary artery without angina pectoris: Secondary | ICD-10-CM

## 2020-09-19 DIAGNOSIS — I119 Hypertensive heart disease without heart failure: Secondary | ICD-10-CM

## 2020-09-26 ENCOUNTER — Other Ambulatory Visit: Payer: Self-pay

## 2020-09-26 ENCOUNTER — Ambulatory Visit (HOSPITAL_COMMUNITY)
Admission: RE | Admit: 2020-09-26 | Discharge: 2020-09-26 | Disposition: A | Discharge status: Home / Self Care | Payer: Medicare Other | Source: Ambulatory Visit | Attending: Internal Medicine | Admitting: Internal Medicine

## 2020-09-26 ENCOUNTER — Other Ambulatory Visit (HOSPITAL_COMMUNITY): Payer: Self-pay | Admitting: Cardiovascular Disease

## 2020-09-26 DIAGNOSIS — E782 Mixed hyperlipidemia: Secondary | ICD-10-CM | POA: Insufficient documentation

## 2020-09-26 DIAGNOSIS — I119 Hypertensive heart disease without heart failure: Secondary | ICD-10-CM | POA: Diagnosis not present

## 2020-09-26 DIAGNOSIS — I739 Peripheral vascular disease, unspecified: Secondary | ICD-10-CM

## 2020-09-26 DIAGNOSIS — I2510 Atherosclerotic heart disease of native coronary artery without angina pectoris: Secondary | ICD-10-CM | POA: Insufficient documentation

## 2020-10-12 DIAGNOSIS — J398 Other specified diseases of upper respiratory tract: Secondary | ICD-10-CM | POA: Diagnosis not present

## 2020-10-12 DIAGNOSIS — J329 Chronic sinusitis, unspecified: Secondary | ICD-10-CM | POA: Diagnosis not present

## 2020-10-28 ENCOUNTER — Ambulatory Visit (HOSPITAL_COMMUNITY)
Admission: EM | Admit: 2020-10-28 | Discharge: 2020-10-28 | Disposition: A | Discharge status: Home / Self Care | Payer: Medicare Other | Attending: Emergency Medicine | Admitting: Emergency Medicine

## 2020-10-28 ENCOUNTER — Other Ambulatory Visit: Payer: Self-pay

## 2020-10-28 ENCOUNTER — Encounter (HOSPITAL_COMMUNITY): Payer: Self-pay | Admitting: Emergency Medicine

## 2020-10-28 DIAGNOSIS — L0291 Cutaneous abscess, unspecified: Secondary | ICD-10-CM | POA: Diagnosis not present

## 2020-10-28 MED ORDER — SULFAMETHOXAZOLE-TRIMETHOPRIM 800-160 MG PO TABS: 1.0000 | ORAL_TABLET | Freq: Two times a day (BID) | ORAL | 0 refills | Status: AC

## 2020-10-28 NOTE — Discharge Instructions (Addendum)
Take the antibiotic as directed.    Call your surgeon on Monday to schedule an appointment as soon as possible.

## 2020-10-28 NOTE — ED Triage Notes (Signed)
PT had a large abscess on left shoulder. It opened at home and drained a large amount. PT wonders if he needs an antibiotic. Area opened yesterday AM

## 2020-10-28 NOTE — ED Provider Notes (Signed)
[place]    CSN: [id number] Arrival date & time: 10/28/20  1641      History   Chief Complaint Chief Complaint  Patient presents with  . Abscess    HPI Joel Rivera. is a 74 y.o. male.   Patient presents with a large abscess on his upper back which opened yesterday at home and has drained a large amount of purulent malodorous drainage.  He denies fever, chills, numbness, weakness, paresthesias, chest pain, back pain, abdominal pain, or other symptoms.  Treatment at home with cleaning and bandages.  His medical history includes recurrent boils, diabetes, hypertension, CHF, CKD 3.  The history is provided by the patient and medical records.    Past Medical History:  Diagnosis Date  . Agent orange exposure   . Arthritis    "neck; left shoulder" (10/25/2016)  . CAD (coronary artery disease)   . Carotid artery disease ([place])   . CHF (congestive heart failure) ([place])   . Chronic post-traumatic stress disorder (PTSD) 03/16/2015  . Chronic sinus infection   . CKD (chronic kidney disease), stage III ([place])   . Claustrophobia 03/16/2015  . Depression   . Diabetic neuropathy ([place])   . Former tobacco use   . GERD (gastroesophageal reflux disease)   . Hyperlipidemia   . Hypertension   . Morbid obesity ([place])   . OSA (obstructive sleep apnea)    "been checked out several times; each time never got any treatment ordered" (10/25/2016)  . PAD (peripheral artery disease) ([place])    a. Diagnostic PV Angio 08/2016 -> s/p RLE PTA in 09/2016. b. s/p LLE PTA in 10/2016.  Marland Kitchen PTSD (post-traumatic stress disorder)    "I'm a Veteran; 100% disability"  . Recurrent boils    abceses  . Type II diabetes mellitus [facility])     Patient Active Problem List   Diagnosis Date Noted  . CKD (chronic kidney disease), stage III ([place])   . Hypertension   . Hyperlipidemia   . PAD (peripheral artery disease) ([place]) 08/27/2016  . Hypertensive heart disease 05/10/2015  . 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 Claudication ([place]) 03/18/2015  . Coronary artery disease involving native heart 03/18/2015  . OSA (obstructive sleep apnea) 03/16/2015  . Chronic post-traumatic stress disorder (PTSD) 03/16/2015  . Sinus infection     Past Surgical History:  Procedure Laterality Date  . ABCESS DRAINAGE     "fluid-filled boils removed off my upper back, left leg; and back of my neck"  . ABDOMINAL HERNIA REPAIR  ~ 2008  . CATARACT EXTRACTION W/ INTRAOCULAR LENS  IMPLANT, BILATERAL Bilateral   . COLONOSCOPY W/ BIOPSIES AND POLYPECTOMY     "was gettin colonoscopy q 5 yrs; 11 polyps found in Dec. 2016; going back in Feb 2018"  . CORONARY ANGIOPLASTY  ~ 2004  . CYST EXCISION Left 2008   inguinal area  . HERNIA REPAIR     "stomach; it's coming back again" (10/25/2016)  . PERIPHERAL VASCULAR CATHETERIZATION N/A 08/27/2016   Procedure: Lower Extremity Angiography;  Surgeon: Lorretta Harp, MD;  Location: [place];  Service: Cardiovascular;  Laterality: N/A;  . PERIPHERAL VASCULAR CATHETERIZATION N/A 09/17/2016   Procedure: Lower Extremity Angiography;  Surgeon: Lorretta Harp, MD;  Location: [place] [facility];  Service: Cardiovascular;  Laterality: N/A;  . PERIPHERAL VASCULAR CATHETERIZATION Right 09/17/2016   Procedure: Peripheral Vascular Atherectomy;  Surgeon: Lorretta Harp, MD;  Location: [facility]  CV LAB;  Service: Cardiovascular;  Laterality: Right;  Common Femoral   . PERIPHERAL VASCULAR CATHETERIZATION Left 10/25/2016   Procedure: Peripheral Vascular Atherectomy;  Surgeon: Lorretta Harp, MD;  Location: [facility];  Service: Cardiovascular;  Laterality: Left;  SFA       Home Medications    Prior to Admission medications   Medication Sig Start Date End Date Taking? Authorizing Provider  acetaminophen (TYLENOL) 325 MG tablet Take 650 mg by mouth every 6 (six) hours as needed (for pain.).    [provider]  aspirin EC 81 MG  tablet Take 81 mg by mouth daily.    [provider]  carvedilol (COREG) 25 MG tablet Take 1 tablet (25 mg total) by mouth 2 (two) times daily with a meal. 09/18/16   Arbutus Leas, NP  cetirizine (ZYRTEC) 10 MG tablet Take 10 mg by mouth daily.    [provider]  chlorthalidone (HYGROTON) 50 MG tablet Take 25 mg by mouth daily.     [provider]  cholecalciferol (VITAMIN D) 1000 UNITS tablet Take 1,000 Units by mouth daily.    [provider]  citalopram (CELEXA) 20 MG tablet Take 10 mg by mouth daily.    [provider]  diclofenac Sodium (VOLTAREN) 1 % GEL Apply 4 g topically 4 (four) times daily. 07/12/20   Darr, Edison Nasuti, PA-C  fluticasone (FLONASE) 50 MCG/ACT nasal spray Place 2 sprays into both nostrils daily. 12/04/19   Raylene Everts, MD  guaiFENesin (MUCINEX) 600 MG 12 hr tablet Take 600 mg by mouth daily as needed for cough or to loosen phlegm.    [provider]  Insulin Glargine-Lixisenatide (SOLIQUA) 100-33 UNT-MCG/ML SOPN Inject 50 Units into the skin daily.     [provider]  JARDIANCE 25 MG TABS tablet Take 25 mg by mouth daily.  09/15/14   [provider]  losartan (COZAAR) 100 MG tablet Take 1 tablet (100 mg total) by mouth daily. 09/19/16   Arbutus Leas, NP  pantoprazole (PROTONIX) 20 MG tablet Take 20 mg by mouth daily.     [provider]  polyethylene glycol powder (GLYCOLAX/MIRALAX) 17 GM/SCOOP powder Take 17 g by mouth daily. 07/12/20   Darr, Edison Nasuti, PA-C  rosuvastatin (CRESTOR) 40 MG tablet Take 40 mg by mouth daily at 12 noon.     [provider]  sodium chloride (OCEAN) 0.65 % SOLN nasal spray Place 1 spray into both nostrils 3 (three) times daily as needed for congestion.    [provider]  sulfamethoxazole-trimethoprim (BACTRIM DS) 800-160 MG tablet Take 1 tablet by mouth 2 (two) times daily for 7 days. 10/28/20 11/04/20  Sharion Balloon, NP    Family History Family  History  Problem Relation Age of Onset  . Urinary tract infection Mother   . Pneumonia Mother   . Aneurysm Father   . Dementia Sister   . Stroke Sister   . Sarcoidosis Sister   . Healthy Sister     Social History Social History   Tobacco Use  . Smoking status: Former Smoker    Packs/day: 3.00    Years: 13.00    Pack years: 39.00    Types: Cigarettes    Quit date: 1986    Years since quitting: 35.9  . Smokeless tobacco: Never Used  Substance Use Topics  . Alcohol use: Yes    Alcohol/week: 0.0 standard drinks    Comment: 10/25/2016 "quit years ago"  . Drug  use: No     Allergies   Atorvastatin, Pravastatin, Simvastatin, and Terazosin   Review of Systems Review of Systems  Constitutional: Negative for chills and fever.  HENT: Negative for ear pain and sore throat.   Eyes: Negative for pain and visual disturbance.  Respiratory: Negative for cough and shortness of breath.   Cardiovascular: Negative for chest pain and palpitations.  Gastrointestinal: Negative for abdominal pain and vomiting.  Genitourinary: Negative for dysuria and hematuria.  Musculoskeletal: Negative for arthralgias and back pain.  Skin: Positive for wound. Negative for color change.  Neurological: Negative for seizures, syncope, weakness and numbness.  All other systems reviewed and are negative.    Physical Exam Triage Vital Signs ED Triage Vitals  Enc Vitals Group     BP 10/28/20 1740 132/60     Pulse Rate 10/28/20 1740 87     Resp 10/28/20 1740 16     Temp 10/28/20 1740 97.8 F (36.6 C)     Temp Source 10/28/20 1740 Oral     SpO2 10/28/20 1740 100 %     Weight --      Height --      Head Circumference --      Peak Flow --      Pain Score 10/28/20 1737 3     Pain Loc --      Pain Edu? --      Excl. in [place]? --    No data found.  Updated Vital Signs BP 132/60   Pulse 87   Temp 97.8 F (36.6 C) (Oral)   Resp 16   SpO2 100%   Visual Acuity Right Eye Distance:   Left Eye  Distance:   Bilateral Distance:    Right Eye Near:   Left Eye Near:    Bilateral Near:     Physical Exam Vitals and nursing note reviewed.  Constitutional:      General: He is not in acute distress.    Appearance: He is well-developed and well-nourished.  HENT:     Head: Normocephalic and atraumatic.     Mouth/Throat:     Mouth: Mucous membranes are moist.  Eyes:     Conjunctiva/sclera: Conjunctivae normal.  Cardiovascular:     Rate and Rhythm: Normal rate and regular rhythm.     Heart sounds: No murmur heard.   Pulmonary:     Effort: Pulmonary effort is normal. No respiratory distress.     Breath sounds: Normal breath sounds.  Abdominal:     Palpations: Abdomen is soft.     Tenderness: There is no abdominal tenderness.  Musculoskeletal:        General: No edema.     Cervical back: Neck supple.  Skin:    General: Skin is warm and dry.     Findings: Lesion present.     Comments: Mid upper back: 15 cm area of induration with 2 cm open lesion draining large amount of malodorous purulent drainage.  No erythema.  Neurological:     General: No focal deficit present.     Mental Status: He is alert and oriented to person, place, and time.     Gait: Gait normal.  Psychiatric:        Mood and Affect: Mood and affect and mood normal.        Behavior: Behavior normal.      UC Treatments / Results  Labs (all labs ordered are listed, but only abnormal results are displayed) Labs Reviewed - No data  to display  EKG   Radiology No results found.  Procedures Procedures (including critical care time)  Medications Ordered in UC Medications - No data to display  Initial Impression / Assessment and Plan / UC Course  I have reviewed the triage vital signs and the nursing notes.  Pertinent labs & imaging results that were available during my care of the patient were reviewed by me and considered in my medical decision making (see chart for details).   Abscess.  Wound is  already open and draining.  Treating with Septra DS.  Wound care instructions discussed.  Directed him to follow-up with his PCP or surgeon on Monday; he has an Cytogeneticist which he has seen previously.  Patient agrees to plan of care.   Final Clinical Impressions(s) / UC Diagnoses   Final diagnoses:  Abscess     Discharge Instructions     Take the antibiotic as directed.    Call your surgeon on Monday to schedule an appointment as soon as possible.        ED Prescriptions    Medication Sig Dispense Auth. Provider   sulfamethoxazole-trimethoprim (BACTRIM DS) 800-160 MG tablet Take 1 tablet by mouth 2 (two) times daily for 7 days. 14 tablet Sharion Balloon, NP     PDMP not reviewed this encounter.   Sharion Balloon, NP 10/28/20 1839

## 2020-10-31 DIAGNOSIS — E118 Type 2 diabetes mellitus with unspecified complications: Secondary | ICD-10-CM | POA: Diagnosis not present

## 2020-10-31 DIAGNOSIS — Z7982 Long term (current) use of aspirin: Secondary | ICD-10-CM | POA: Diagnosis not present

## 2020-10-31 DIAGNOSIS — E785 Hyperlipidemia, unspecified: Secondary | ICD-10-CM | POA: Diagnosis not present

## 2020-10-31 DIAGNOSIS — I10 Essential (primary) hypertension: Secondary | ICD-10-CM | POA: Diagnosis not present

## 2020-11-02 DIAGNOSIS — I10 Essential (primary) hypertension: Secondary | ICD-10-CM | POA: Diagnosis not present

## 2020-11-02 DIAGNOSIS — Z794 Long term (current) use of insulin: Secondary | ICD-10-CM | POA: Diagnosis not present

## 2020-11-02 DIAGNOSIS — I2510 Atherosclerotic heart disease of native coronary artery without angina pectoris: Secondary | ICD-10-CM | POA: Diagnosis not present

## 2020-11-02 DIAGNOSIS — E785 Hyperlipidemia, unspecified: Secondary | ICD-10-CM | POA: Diagnosis not present

## 2020-11-02 DIAGNOSIS — L0291 Cutaneous abscess, unspecified: Secondary | ICD-10-CM | POA: Diagnosis not present

## 2020-11-02 DIAGNOSIS — E1165 Type 2 diabetes mellitus with hyperglycemia: Secondary | ICD-10-CM | POA: Diagnosis not present

## 2020-11-10 DIAGNOSIS — J343 Hypertrophy of nasal turbinates: Secondary | ICD-10-CM | POA: Diagnosis not present

## 2020-11-10 DIAGNOSIS — J310 Chronic rhinitis: Secondary | ICD-10-CM | POA: Diagnosis not present

## 2020-11-10 DIAGNOSIS — R0982 Postnasal drip: Secondary | ICD-10-CM | POA: Diagnosis not present

## 2020-11-29 DIAGNOSIS — L0291 Cutaneous abscess, unspecified: Secondary | ICD-10-CM | POA: Diagnosis not present

## 2020-11-30 DIAGNOSIS — E785 Hyperlipidemia, unspecified: Secondary | ICD-10-CM | POA: Diagnosis not present

## 2020-11-30 DIAGNOSIS — I2510 Atherosclerotic heart disease of native coronary artery without angina pectoris: Secondary | ICD-10-CM | POA: Diagnosis not present

## 2020-11-30 DIAGNOSIS — Z794 Long term (current) use of insulin: Secondary | ICD-10-CM | POA: Diagnosis not present

## 2020-11-30 DIAGNOSIS — E1165 Type 2 diabetes mellitus with hyperglycemia: Secondary | ICD-10-CM | POA: Diagnosis not present

## 2020-11-30 DIAGNOSIS — I10 Essential (primary) hypertension: Secondary | ICD-10-CM | POA: Diagnosis not present

## 2020-11-30 DIAGNOSIS — Z79899 Other long term (current) drug therapy: Secondary | ICD-10-CM | POA: Diagnosis not present

## 2020-11-30 DIAGNOSIS — L0291 Cutaneous abscess, unspecified: Secondary | ICD-10-CM | POA: Diagnosis not present

## 2021-01-04 DIAGNOSIS — J309 Allergic rhinitis, unspecified: Secondary | ICD-10-CM | POA: Diagnosis not present

## 2021-01-10 DIAGNOSIS — I739 Peripheral vascular disease, unspecified: Secondary | ICD-10-CM | POA: Diagnosis not present

## 2021-01-10 DIAGNOSIS — N183 Chronic kidney disease, stage 3 unspecified: Secondary | ICD-10-CM | POA: Diagnosis not present

## 2021-01-10 DIAGNOSIS — E1122 Type 2 diabetes mellitus with diabetic chronic kidney disease: Secondary | ICD-10-CM | POA: Diagnosis not present

## 2021-01-10 DIAGNOSIS — I129 Hypertensive chronic kidney disease with stage 1 through stage 4 chronic kidney disease, or unspecified chronic kidney disease: Secondary | ICD-10-CM | POA: Diagnosis not present

## 2021-01-30 DIAGNOSIS — Z79899 Other long term (current) drug therapy: Secondary | ICD-10-CM | POA: Diagnosis not present

## 2021-01-30 DIAGNOSIS — I10 Essential (primary) hypertension: Secondary | ICD-10-CM | POA: Diagnosis not present

## 2021-01-30 DIAGNOSIS — E1165 Type 2 diabetes mellitus with hyperglycemia: Secondary | ICD-10-CM | POA: Diagnosis not present

## 2021-01-30 DIAGNOSIS — E785 Hyperlipidemia, unspecified: Secondary | ICD-10-CM | POA: Diagnosis not present

## 2021-02-01 DIAGNOSIS — E1165 Type 2 diabetes mellitus with hyperglycemia: Secondary | ICD-10-CM | POA: Diagnosis not present

## 2021-02-01 DIAGNOSIS — Z79899 Other long term (current) drug therapy: Secondary | ICD-10-CM | POA: Diagnosis not present

## 2021-02-01 DIAGNOSIS — I10 Essential (primary) hypertension: Secondary | ICD-10-CM | POA: Diagnosis not present

## 2021-02-01 DIAGNOSIS — L0291 Cutaneous abscess, unspecified: Secondary | ICD-10-CM | POA: Diagnosis not present

## 2021-02-01 DIAGNOSIS — E785 Hyperlipidemia, unspecified: Secondary | ICD-10-CM | POA: Diagnosis not present

## 2021-02-01 DIAGNOSIS — I2510 Atherosclerotic heart disease of native coronary artery without angina pectoris: Secondary | ICD-10-CM | POA: Diagnosis not present

## 2021-02-01 DIAGNOSIS — Z794 Long term (current) use of insulin: Secondary | ICD-10-CM | POA: Diagnosis not present

## 2021-02-01 DIAGNOSIS — E0841 Diabetes mellitus due to underlying condition with diabetic mononeuropathy: Secondary | ICD-10-CM | POA: Diagnosis not present

## 2021-03-06 ENCOUNTER — Telehealth: Payer: Self-pay | Admitting: *Deleted

## 2021-03-06 ENCOUNTER — Ambulatory Visit: Payer: Self-pay | Admitting: Surgery

## 2021-03-06 DIAGNOSIS — L723 Sebaceous cyst: Secondary | ICD-10-CM | POA: Diagnosis not present

## 2021-03-06 NOTE — H&P (Signed)
Joel Rivera Appointment: 03/06/2021 11:00 AM Location: [name] Patient #: [id number] DOB: 01-23-1946 Married / Language: English / Race: Black or African American Male  History of Present Illness Joel Rivera A. Ahmet Schank MD; 03/06/2021 11:22 AM) Patient words: Patient presents for evaluation of an upper back sebaceous cyst. It has been present for many months and is been red and inflamed and at times drains. He has no fever or chills. This is a chronic problem at this point. He has been seen in the emergency room for it or drainage is been done but it has never been excised.  The patient is a 75 year old male.   Past Surgical History Joel Rivera, Joel Rivera; 03/06/2021 10:59 AM) Colon Polyp Removal - Open  Diagnostic Studies History Joel Rivera, CMA; 03/06/2021 10:59 AM) Colonoscopy 1-5 years ago  Allergies Joel Rivera, CMA; 03/06/2021 11:00 AM) No Known Drug Allergies [03/06/2021]: Allergies Reconciled  Medication History Joel Rivera, CMA; 03/06/2021 11:04 AM) Carvedilol (3.125MG  Tablet, Oral) Active. Cetirizine HCl (10MG  Tablet, Oral) Active. Cholecalciferol (25MCG/0.03ML Liquid, Oral) Active. CeleXA (10MG  Tablet, Oral) Active. Diclofenac (35MG  Capsule, Oral) Active. Fluticasone Furoate (27.5MCG/SPRAY Suspension, Nasal) Active. Insulin Glargine-Lixisenatide (100-33UNT-MCG/ML Soln Pen-inj, Subcutaneous) Active. Jardiance (10MG  Tablet, Oral) Active. Losartan Potassium (25MG  Tablet, Oral) Active. Pantoprazole Sodium (40MG  For Solution, Intravenous) Active. Polyethylene Glycol Active. Rosuvastatin Calcium (10MG  Tablet, Oral) Active. Sodium Chloride (1GM Tablet, Oral) Active.  Social History Joel Rivera, CMA; 03/06/2021 10:59 AM) No caffeine use No drug use Tobacco use Former smoker.  Family History Joel Rivera, CMA; 03/06/2021 10:59 AM) Diabetes Mellitus Son. Hypertension Father, Sister, Son.  Other Problems Joel Rivera, CMA; 03/06/2021 10:59 AM) Anxiety  Disorder Arthritis Depression Diabetes Mellitus Enlarged Prostate Gastroesophageal Reflux Disease High blood pressure Hypercholesterolemia Sleep Apnea     Review of Systems Joel Rivera CMA; 03/06/2021 10:59 AM) General Not Present- Appetite Loss, Chills, Fatigue, Fever, Night Sweats, Weight Gain and Weight Loss. Skin Present- Dryness and Non-Healing Wounds. Not Present- Change in Wart/Mole, Hives, Jaundice, New Lesions, Rash and Ulcer. HEENT Present- Nose Bleed, Sinus Pain and Wears glasses/contact lenses. Not Present- Earache, Hearing Loss, Hoarseness, Oral Ulcers, Ringing in the Ears, Seasonal Allergies, Sore Throat, Visual Disturbances and Yellow Eyes. Respiratory Not Present- Bloody sputum, Chronic Cough, Difficulty Breathing, Snoring and Wheezing. Breast Not Present- Breast Mass, Breast Pain, Nipple Discharge and Skin Changes. Cardiovascular Present- Leg Cramps. Not Present- Chest Pain, Difficulty Breathing Lying Down, Palpitations, Rapid Heart Rate, Shortness of Breath and Swelling of Extremities. Gastrointestinal Present- Constipation, Excessive gas and Hemorrhoids. Not Present- Abdominal Pain, Bloating, Bloody Stool, Change in Bowel Habits, Chronic diarrhea, Difficulty Swallowing, Gets full quickly at meals, Indigestion, Nausea, Rectal Pain and Vomiting. Male Genitourinary Present- Frequency, Nocturia and Urine Leakage. Not Present- Blood in Urine, Change in Urinary Stream, Impotence, Painful Urination and Urgency. Musculoskeletal Not Present- Back Pain, Joint Pain, Joint Stiffness, Muscle Pain, Muscle Weakness and Swelling of Extremities. Neurological Not Present- Decreased Memory, Fainting, Headaches, Numbness, Seizures, Tingling, Tremor, Trouble walking and Weakness. Psychiatric Present- Change in Sleep Pattern. Not Present- Anxiety, Bipolar, Depression, Fearful and Frequent crying. Endocrine Not Present- Cold Intolerance, Excessive Hunger, Hair Changes, Heat Intolerance,  Hot flashes and New Diabetes. Hematology Not Present- Blood Thinners, Easy Bruising, Excessive bleeding, Gland problems, HIV and Persistent Infections.  Vitals Joel Rivera CMA; 03/06/2021 11:05 AM) 03/06/2021 11:04 AM Weight: 269.38 lb Height: 68in Body Surface Area: 2.32 m Body Mass Index: 40.96 kg/m  Temp.: 38F  Pulse: 82 (Regular)  P.OX: 95% (Room air) BP: 126/76(Sitting, Left Arm, Standard)  Physical Exam ([name] A. Lavren Lewan MD; 03/06/2021 11:23 AM)  General Mental Status-Alert. General Appearance-Consistent with stated age. Hydration-Well hydrated. Voice-Normal.  Integumentary Note: Upper central back is a 5 cm partially draining sebaceous cyst without signs of erythema, fluctuance or infection. It is in the subcutaneous tissues.  Cardiovascular Note: Normal sinus rhythm, normal cap refill, minimal peripheral edema noted  Neurologic Neurologic evaluation reveals -alert and oriented x 3 with no impairment of recent or remote memory. Mental Status-Normal.  Musculoskeletal Normal Exam - Left-Upper Extremity Strength Normal and Lower Extremity Strength Normal. Normal Exam - Right-Upper Extremity Strength Normal, Lower Extremity Weakness.    Assessment & Plan ([name] A. [name] MD; 03/06/2021 11:24 AM)  SEBACEOUS CYST (L72.3) Impression: Recommend excision due to chronic inflammation in history of infection. Risks, benefits and long-term expectations discussed. He may require cardiac clearance. Recommend keeping his blood sugars under good control. Risk of surgery including bleeding, infection, recurrence, exacerbation of underlying medical problems, death, deep vein thrombosis,. Also discussed medical management as well.  Current Plans The pathophysiology of skin & subcutaneous masses was discussed. Natural history risks without surgery were discussed. I recommended surgery to remove the mass. I explained the technique of removal  with use of local anesthesia & possible need for more aggressive sedation/anesthesia for patient comfort.  Risks such as bleeding, infection, wound breakdown, heart attack, death, and other risks were discussed. I noted a good likelihood this will help address the problem. Possibility that this will not correct all symptoms was explained. Possibility of regrowth/recurrence of the mass was discussed. We will work to minimize complications. Questions were answered. The patient expresses understanding & wishes to proceed with surgery.  Pt Education - [facility] Free Text Education/Instructions: discussed with patient and provided information. Pt Education - [facility] General Post-op HCI

## 2021-03-06 NOTE — Telephone Encounter (Signed)
   [facility] Pre-operative Risk Assessment    Patient Name: Joel Rivera.  DOB: [birth year]  MRN: [id number]   HEARTCARE STAFF: - Please ensure there is not already an duplicate clearance open for this procedure. - Under Visit Info/Reason for Call, type in Other and utilize the format Clearance MM/DD/YY or Clearance TBD. Do not use dashes or single digits. - If request is for dental extraction, please clarify the # of teeth to be extracted.  Request for surgical clearance:  1. What type of surgery is being performed? CYST EXCISION   2. When is this surgery scheduled? TBD   3. What type of clearance is required (medical clearance vs. Pharmacy clearance to hold med vs. Both)? MEDICAL  4. Are there any medications that need to be held prior to surgery and how long? ASA x 5 DAYS  5. Practice name and name of physician performing surgery? [facility]; DR. [name] CORNETT   6. What is the office phone number? [phone]   7.   What is the office fax number? [place]: [place], CMA  8.   Anesthesia type (None, local, MAC, general) ? GENERAL   Julaine Hua 03/06/2021, 12:15 PM  _________________________________________________________________   (provider comments below)

## 2021-03-06 NOTE — H&P (View-Only) (Signed)
Joel Rivera Appointment: 03/06/2021 11:00 AM Location: [facility] Patient #: [id number] DOB: 03-20-1946 Married / Language: English / Race: Black or African American Male  History of Present Illness Joel Rivera A. Hermes Wafer MD; 03/06/2021 11:22 AM) Patient words: Patient presents for evaluation of an upper back sebaceous cyst. It has been present for many months and is been red and inflamed and at times drains. He has no fever or chills. This is a chronic problem at this point. He has been seen in the emergency room for it or drainage is been done but it has never been excised.  The patient is a 75 year old male.   Past Surgical History Joel Rivera, [place]; 03/06/2021 10:59 AM) Colon Polyp Removal - Open  Diagnostic Studies History Joel Rivera, CMA; 03/06/2021 10:59 AM) Colonoscopy 1-5 years ago  Allergies Joel Rivera, CMA; 03/06/2021 11:00 AM) No Known Drug Allergies [03/06/2021]: Allergies Reconciled  Medication History Joel Rivera, CMA; 03/06/2021 11:04 AM) Carvedilol (3.125MG  Tablet, Oral) Active. Cetirizine HCl (10MG  Tablet, Oral) Active. Cholecalciferol (25MCG/0.03ML Liquid, Oral) Active. CeleXA (10MG  Tablet, Oral) Active. Diclofenac (35MG  Capsule, Oral) Active. Fluticasone Furoate (27.5MCG/SPRAY Suspension, Nasal) Active. Insulin Glargine-Lixisenatide (100-33UNT-MCG/ML Soln Pen-inj, Subcutaneous) Active. Jardiance (10MG  Tablet, Oral) Active. Losartan Potassium (25MG  Tablet, Oral) Active. Pantoprazole Sodium (40MG  For Solution, Intravenous) Active. Polyethylene Glycol Active. Rosuvastatin Calcium (10MG  Tablet, Oral) Active. Sodium Chloride (1GM Tablet, Oral) Active.  Social History Joel Rivera, CMA; 03/06/2021 10:59 AM) No caffeine use No drug use Tobacco use Former smoker.  Family History Joel Rivera, CMA; 03/06/2021 10:59 AM) Diabetes Mellitus Son. Hypertension Father, Sister, Son.  Other Problems Joel Rivera, CMA; 03/06/2021 10:59 AM) Anxiety  Disorder Arthritis Depression Diabetes Mellitus Enlarged Prostate Gastroesophageal Reflux Disease High blood pressure Hypercholesterolemia Sleep Apnea     Review of Systems Joel Rivera CMA; 03/06/2021 10:59 AM) General Not Present- Appetite Loss, Chills, Fatigue, Fever, Night Sweats, Weight Gain and Weight Loss. Skin Present- Dryness and Non-Healing Wounds. Not Present- Change in Wart/Mole, Hives, Jaundice, New Lesions, Rash and Ulcer. HEENT Present- Nose Bleed, Sinus Pain and Wears glasses/contact lenses. Not Present- Earache, Hearing Loss, Hoarseness, Oral Ulcers, Ringing in the Ears, Seasonal Allergies, Sore Throat, Visual Disturbances and Yellow Eyes. Respiratory Not Present- Bloody sputum, Chronic Cough, Difficulty Breathing, Snoring and Wheezing. Breast Not Present- Breast Mass, Breast Pain, Nipple Discharge and Skin Changes. Cardiovascular Present- Leg Cramps. Not Present- Chest Pain, Difficulty Breathing Lying Down, Palpitations, Rapid Heart Rate, Shortness of Breath and Swelling of Extremities. Gastrointestinal Present- Constipation, Excessive gas and Hemorrhoids. Not Present- Abdominal Pain, Bloating, Bloody Stool, Change in Bowel Habits, Chronic diarrhea, Difficulty Swallowing, Gets full quickly at meals, Indigestion, Nausea, Rectal Pain and Vomiting. Male Genitourinary Present- Frequency, Nocturia and Urine Leakage. Not Present- Blood in Urine, Change in Urinary Stream, Impotence, Painful Urination and Urgency. Musculoskeletal Not Present- Back Pain, Joint Pain, Joint Stiffness, Muscle Pain, Muscle Weakness and Swelling of Extremities. Neurological Not Present- Decreased Memory, Fainting, Headaches, Numbness, Seizures, Tingling, Tremor, Trouble walking and Weakness. Psychiatric Present- Change in Sleep Pattern. Not Present- Anxiety, Bipolar, Depression, Fearful and Frequent crying. Endocrine Not Present- Cold Intolerance, Excessive Hunger, Hair Changes, Heat Intolerance,  Hot flashes and New Diabetes. Hematology Not Present- Blood Thinners, Easy Bruising, Excessive bleeding, Gland problems, HIV and Persistent Infections.  Vitals Joel Rivera CMA; 03/06/2021 11:05 AM) 03/06/2021 11:04 AM Weight: 269.38 lb Height: 68in Body Surface Area: 2.32 m Body Mass Index: 40.96 kg/m  Temp.: 47F  Pulse: 82 (Regular)  P.OX: 95% (Room air) BP: 126/76(Sitting, Left Arm, Standard)  Physical Exam ([name] A. Hollis Tuller MD; 03/06/2021 11:23 AM)  General Mental Status-Alert. General Appearance-Consistent with stated age. Hydration-Well hydrated. Voice-Normal.  Integumentary Note: Upper central back is a 5 cm partially draining sebaceous cyst without signs of erythema, fluctuance or infection. It is in the subcutaneous tissues.  Cardiovascular Note: Normal sinus rhythm, normal cap refill, minimal peripheral edema noted  Neurologic Neurologic evaluation reveals -alert and oriented x 3 with no impairment of recent or remote memory. Mental Status-Normal.  Musculoskeletal Normal Exam - Left-Upper Extremity Strength Normal and Lower Extremity Strength Normal. Normal Exam - Right-Upper Extremity Strength Normal, Lower Extremity Weakness.    Assessment & Plan (Joel Rivera A. Derryl Uher MD; 03/06/2021 11:24 AM)  SEBACEOUS CYST (L72.3) Impression: Recommend excision due to chronic inflammation in history of infection. Risks, benefits and long-term expectations discussed. He may require cardiac clearance. Recommend keeping his blood sugars under good control. Risk of surgery including bleeding, infection, recurrence, exacerbation of underlying medical problems, death, deep vein thrombosis,. Also discussed medical management as well.  Current Plans The pathophysiology of skin & subcutaneous masses was discussed. Natural history risks without surgery were discussed. I recommended surgery to remove the mass. I explained the technique of removal  with use of local anesthesia & possible need for more aggressive sedation/anesthesia for patient comfort.  Risks such as bleeding, infection, wound breakdown, heart attack, death, and other risks were discussed. I noted a good likelihood this will help address the problem. Possibility that this will not correct all symptoms was explained. Possibility of regrowth/recurrence of the mass was discussed. We will work to minimize complications. Questions were answered. The patient expresses understanding & wishes to proceed with surgery.  Pt Education - [facility] Free Text Education/Instructions: discussed with patient and provided information. Pt Education - [facility] General Post-op HCI

## 2021-03-07 NOTE — Telephone Encounter (Signed)
Okay to proceed with surgery. Okay to hols aspirin up to 5 days.

## 2021-03-07 NOTE — Telephone Encounter (Signed)
I clarified with [facility].  He has a 5 cm large sebaceous cyst in the upper central back that has been going on for several months.  Sometimes it becomes red and inflamed.  Patient was last seen by his primary cardiologist Dr. Tamala Julian via virtual visit on 04/14/2019.  Patient was evaluated by Dr. Gwenlyn Found for claudication symptom in October 2021.  He has upcoming visit with Dr. Tamala Julian next month, will defer cardiac clearance to MD during office visit.

## 2021-03-07 NOTE — Telephone Encounter (Signed)
Patient was cleared by MD.  Please inform the patient to hold aspirin for 5 days prior to the procedure and restart as soon as possible afterward at the surgeon's discretion.

## 2021-03-08 NOTE — Telephone Encounter (Signed)
Spoke with patients wife, DPR on file, as patient is not feeling well and was taking a nap. Made wife aware of message below regarding clearance and holding aspirin.  I informed her that I would fax this information to the requesting surgeons office as well to make them aware. She states understanding and appreciation for the call.

## 2021-03-16 ENCOUNTER — Encounter (HOSPITAL_BASED_OUTPATIENT_CLINIC_OR_DEPARTMENT_OTHER): Payer: Self-pay | Admitting: Surgery

## 2021-03-16 ENCOUNTER — Other Ambulatory Visit: Payer: Self-pay

## 2021-03-20 ENCOUNTER — Other Ambulatory Visit (HOSPITAL_COMMUNITY)
Admission: RE | Admit: 2021-03-20 | Discharge: 2021-03-20 | Disposition: A | Discharge status: Home / Self Care | Payer: Medicare Other | Source: Ambulatory Visit | Attending: Surgery | Admitting: Surgery

## 2021-03-20 DIAGNOSIS — Z20822 Contact with and (suspected) exposure to covid-19: Secondary | ICD-10-CM | POA: Insufficient documentation

## 2021-03-20 DIAGNOSIS — Z01812 Encounter for preprocedural laboratory examination: Secondary | ICD-10-CM | POA: Diagnosis not present

## 2021-03-21 ENCOUNTER — Encounter (HOSPITAL_BASED_OUTPATIENT_CLINIC_OR_DEPARTMENT_OTHER)
Admission: RE | Admit: 2021-03-21 | Discharge: 2021-03-21 | Disposition: A | Discharge status: Home / Self Care | Payer: Medicare Other | Source: Ambulatory Visit | Attending: Surgery | Admitting: Surgery

## 2021-03-21 DIAGNOSIS — Z01812 Encounter for preprocedural laboratory examination: Secondary | ICD-10-CM | POA: Insufficient documentation

## 2021-03-21 LAB — COMPREHENSIVE METABOLIC PANEL
ALT: 17 U/L (ref 0–44)
AST: 20 U/L (ref 15–41)
Albumin: 3.6 g/dL (ref 3.5–5.0)
Alkaline Phosphatase: 85 U/L (ref 38–126)
Anion gap: 6 (ref 5–15)
BUN: 14 mg/dL (ref 8–23)
CO2: 25 mmol/L (ref 22–32)
Calcium: 9.2 mg/dL (ref 8.9–10.3)
Chloride: 104 mmol/L (ref 98–111)
Creatinine, Ser: 1.32 mg/dL — ABNORMAL HIGH (ref 0.61–1.24)
GFR, Estimated: 56 mL/min — ABNORMAL LOW (ref >60)
Glucose, Bld: 102 mg/dL — ABNORMAL HIGH (ref 70–99)
Potassium: 3.6 mmol/L (ref 3.5–5.1)
Sodium: 135 mmol/L (ref 135–145)
Total Bilirubin: 1 mg/dL (ref 0.3–1.2)
Total Protein: 7.2 g/dL (ref 6.5–8.1)

## 2021-03-21 LAB — CBC WITH DIFFERENTIAL/PLATELET
Abs Immature Granulocytes: 0.02 10*3/uL (ref 0.00–0.07)
Basophils Absolute: 0 10*3/uL (ref 0.0–0.1)
Basophils Relative: 0 %
Eosinophils Absolute: 0.1 10*3/uL (ref 0.0–0.5)
Eosinophils Relative: 1 %
HCT: 42.3 % (ref 39.0–52.0)
Hemoglobin: 13.8 g/dL (ref 13.0–17.0)
Immature Granulocytes: 0 %
Lymphocytes Relative: 32 %
Lymphs Abs: 2.5 10*3/uL (ref 0.7–4.0)
MCH: 29 pg (ref 26.0–34.0)
MCHC: 32.6 g/dL (ref 30.0–36.0)
MCV: 88.9 fL (ref 80.0–100.0)
Monocytes Absolute: 0.9 10*3/uL (ref 0.1–1.0)
Monocytes Relative: 12 %
Neutro Abs: 4.2 10*3/uL (ref 1.7–7.7)
Neutrophils Relative %: 55 %
Platelets: 179 10*3/uL (ref 150–400)
RBC: 4.76 MIL/uL (ref 4.22–5.81)
RDW: 13.1 % (ref 11.5–15.5)
WBC: 7.8 10*3/uL (ref 4.0–10.5)
nRBC: 0 % (ref 0.0–0.2)

## 2021-03-21 LAB — SARS CORONAVIRUS 2 (TAT 6-24 HRS): SARS Coronavirus 2: NEGATIVE

## 2021-03-21 NOTE — Progress Notes (Signed)

## 2021-03-22 NOTE — Anesthesia Preprocedure Evaluation (Addendum)
Anesthesia Evaluation  Patient identified by MRN, date of birth, ID band Patient awake    Reviewed: Allergy & Precautions, NPO status , Patient's Chart, lab work & pertinent test results  Airway Mallampati: II  TM Distance: >3 FB Neck ROM: Full    Dental no notable dental hx. (+) Upper Dentures, Teeth Intact, Dental Advisory Given   Pulmonary sleep apnea and Continuous Positive Airway Pressure Ventilation , former smoker,    Pulmonary exam normal breath sounds clear to auscultation       Cardiovascular hypertension, Pt. on medications + CAD  Normal cardiovascular exam Rhythm:Regular Rate:Normal  05/11/2019 Echo 1. The left ventricle has hyperdynamic systolic function, with an  ejection fraction of >65%. The cavity size was normal. Left ventricular  diastolic Doppler parameters are indeterminate.  2. The right ventricle has normal systolic function. The cavity was  normal.  3. The aortic valve is tricuspid. Mild thickening of the aortic valve. No  stenosis of the aortic valve.  4. Vigorous LV function; no significant valvular heart disease.  5. The mitral valve is grossly normal.  6. The tricuspid valve is grossly normal.    Neuro/Psych Anxiety    GI/Hepatic GERD  ,  Endo/Other  diabetesMorbid obesity  Renal/GU Lab Results      Component                Value               Date                      CREATININE               1.32 (H)            03/21/2021                BUN                      14                  03/21/2021                NA                       135                 03/21/2021                K                        3.6                 03/21/2021                CL                       104                 03/21/2021                CO2                      25                  03/21/2021                Musculoskeletal  Abdominal (+) + obese,   Peds  Hematology Lab Results      Component                 Value               Date                      WBC                      7.8                 03/21/2021                HGB                      13.8                03/21/2021                HCT                      42.3                03/21/2021                MCV                      88.9                03/21/2021                PLT                      179                 03/21/2021              Anesthesia Other Findings   Reproductive/Obstetrics                            Anesthesia Physical Anesthesia Plan  ASA: III  Anesthesia Plan: General   Post-op Pain Management:    Induction: Intravenous  PONV Risk Score and Plan: 3 and Treatment may vary due to age or medical condition, Ondansetron, Dexamethasone and Midazolam  Airway Management Planned: Oral ETT  Additional Equipment: None  Intra-op Plan:   Post-operative Plan: Extubation in OR  Informed Consent: I have reviewed the patients History and Physical, chart, labs and discussed the procedure including the risks, benefits and alternatives for the proposed anesthesia with the patient or authorized representative who has indicated his/her understanding and acceptance.     Dental advisory given  Plan Discussed with: CRNA  Anesthesia Plan Comments:         Anesthesia Quick Evaluation

## 2021-03-23 ENCOUNTER — Ambulatory Visit (HOSPITAL_BASED_OUTPATIENT_CLINIC_OR_DEPARTMENT_OTHER): Payer: Medicare Other | Admitting: Anesthesiology

## 2021-03-23 ENCOUNTER — Ambulatory Visit (HOSPITAL_BASED_OUTPATIENT_CLINIC_OR_DEPARTMENT_OTHER)
Admission: RE | Admit: 2021-03-23 | Discharge: 2021-03-23 | Disposition: A | Discharge status: Home / Self Care | Payer: Medicare Other | Attending: Surgery | Admitting: Surgery

## 2021-03-23 ENCOUNTER — Other Ambulatory Visit: Payer: Self-pay

## 2021-03-23 ENCOUNTER — Encounter (HOSPITAL_BASED_OUTPATIENT_CLINIC_OR_DEPARTMENT_OTHER): Payer: Self-pay | Admitting: Surgery

## 2021-03-23 ENCOUNTER — Encounter (HOSPITAL_BASED_OUTPATIENT_CLINIC_OR_DEPARTMENT_OTHER)
Admission: RE | Disposition: A | Discharge status: Home / Self Care | Payer: Self-pay | Source: Home / Self Care | Attending: Surgery

## 2021-03-23 DIAGNOSIS — Z9989 Dependence on other enabling machines and devices: Secondary | ICD-10-CM | POA: Diagnosis not present

## 2021-03-23 DIAGNOSIS — Z87891 Personal history of nicotine dependence: Secondary | ICD-10-CM | POA: Insufficient documentation

## 2021-03-23 DIAGNOSIS — G4733 Obstructive sleep apnea (adult) (pediatric): Secondary | ICD-10-CM | POA: Diagnosis not present

## 2021-03-23 DIAGNOSIS — Z794 Long term (current) use of insulin: Secondary | ICD-10-CM | POA: Diagnosis not present

## 2021-03-23 DIAGNOSIS — Z7984 Long term (current) use of oral hypoglycemic drugs: Secondary | ICD-10-CM | POA: Diagnosis not present

## 2021-03-23 DIAGNOSIS — L720 Epidermal cyst: Secondary | ICD-10-CM | POA: Diagnosis not present

## 2021-03-23 DIAGNOSIS — L723 Sebaceous cyst: Secondary | ICD-10-CM | POA: Diagnosis not present

## 2021-03-23 DIAGNOSIS — E785 Hyperlipidemia, unspecified: Secondary | ICD-10-CM | POA: Diagnosis not present

## 2021-03-23 DIAGNOSIS — Z79899 Other long term (current) drug therapy: Secondary | ICD-10-CM | POA: Diagnosis not present

## 2021-03-23 HISTORY — PX: CYST EXCISION: SHX5701

## 2021-03-23 HISTORY — DX: Allergy, unspecified, initial encounter: T78.40XA

## 2021-03-23 LAB — GLUCOSE, CAPILLARY
Glucose-Capillary: 86 mg/dL (ref 70–99)
Glucose-Capillary: 96 mg/dL (ref 70–99)

## 2021-03-23 SURGERY — CYST REMOVAL
Anesthesia: General | Site: Back

## 2021-03-23 MED ORDER — DEXAMETHASONE SODIUM PHOSPHATE 4 MG/ML IJ SOLN
INTRAMUSCULAR | Status: DC | PRN
  Administered 2021-03-23: 5 mg via INTRAVENOUS

## 2021-03-23 MED ORDER — PROPOFOL 10 MG/ML IV BOLUS
INTRAVENOUS | Status: DC | PRN
  Administered 2021-03-23: 150 mg via INTRAVENOUS

## 2021-03-23 MED ORDER — ACETAMINOPHEN 10 MG/ML IV SOLN: 1000.0000 mg | Freq: Once | INTRAVENOUS | Status: DC | PRN

## 2021-03-23 MED ORDER — FENTANYL CITRATE (PF) 100 MCG/2ML IJ SOLN
INTRAMUSCULAR | Status: DC | PRN
  Administered 2021-03-23 (×2): 50 ug via INTRAVENOUS

## 2021-03-23 MED ORDER — ACETAMINOPHEN 500 MG PO TABS
1000.0000 mg | ORAL_TABLET | ORAL | Status: AC
  Administered 2021-03-23: 1000 mg via ORAL

## 2021-03-23 MED ORDER — CEFAZOLIN SODIUM 1 G IJ SOLR
INTRAMUSCULAR | Status: AC
  Filled 2021-03-23: qty 10

## 2021-03-23 MED ORDER — LIDOCAINE HCL (CARDIAC) PF 100 MG/5ML IV SOSY
PREFILLED_SYRINGE | INTRAVENOUS | Status: DC | PRN
  Administered 2021-03-23: 100 mg via INTRAVENOUS

## 2021-03-23 MED ORDER — CHLORHEXIDINE GLUCONATE CLOTH 2 % EX PADS: 6.0000 | MEDICATED_PAD | Freq: Once | CUTANEOUS | Status: DC

## 2021-03-23 MED ORDER — CEFAZOLIN SODIUM-DEXTROSE 2-4 GM/100ML-% IV SOLN
INTRAVENOUS | Status: AC
  Filled 2021-03-23: qty 100

## 2021-03-23 MED ORDER — CEFAZOLIN IN SODIUM CHLORIDE 3-0.9 GM/100ML-% IV SOLN
3.0000 g | INTRAVENOUS | Status: AC
  Administered 2021-03-23: 3 g via INTRAVENOUS

## 2021-03-23 MED ORDER — MIDAZOLAM HCL 5 MG/5ML IJ SOLN
INTRAMUSCULAR | Status: DC | PRN
  Administered 2021-03-23: 1 mg via INTRAVENOUS

## 2021-03-23 MED ORDER — LIDOCAINE 2% (20 MG/ML) 5 ML SYRINGE
INTRAMUSCULAR | Status: AC
  Filled 2021-03-23: qty 5

## 2021-03-23 MED ORDER — ROCURONIUM BROMIDE 100 MG/10ML IV SOLN
INTRAVENOUS | Status: DC | PRN
  Administered 2021-03-23: 50 mg via INTRAVENOUS

## 2021-03-23 MED ORDER — FENTANYL CITRATE (PF) 100 MCG/2ML IJ SOLN
INTRAMUSCULAR | Status: AC
  Filled 2021-03-23: qty 2

## 2021-03-23 MED ORDER — MIDAZOLAM HCL 2 MG/2ML IJ SOLN
INTRAMUSCULAR | Status: AC
  Filled 2021-03-23: qty 2

## 2021-03-23 MED ORDER — ACETAMINOPHEN 500 MG PO TABS
ORAL_TABLET | ORAL | Status: AC
  Filled 2021-03-23: qty 2

## 2021-03-23 MED ORDER — FENTANYL CITRATE (PF) 100 MCG/2ML IJ SOLN: 25.0000 ug | INTRAMUSCULAR | Status: DC | PRN

## 2021-03-23 MED ORDER — LACTATED RINGERS IV SOLN: INTRAVENOUS | Status: DC

## 2021-03-23 MED ORDER — ONDANSETRON HCL 4 MG/2ML IJ SOLN: 4.0000 mg | Freq: Once | INTRAMUSCULAR | Status: DC | PRN

## 2021-03-23 MED ORDER — ONDANSETRON HCL 4 MG/2ML IJ SOLN
INTRAMUSCULAR | Status: DC | PRN
  Administered 2021-03-23: 4 mg via INTRAVENOUS

## 2021-03-23 MED ORDER — PROPOFOL 10 MG/ML IV BOLUS
INTRAVENOUS | Status: AC
  Filled 2021-03-23: qty 20

## 2021-03-23 MED ORDER — SUGAMMADEX SODIUM 500 MG/5ML IV SOLN
INTRAVENOUS | Status: DC | PRN
  Administered 2021-03-23: 100 mg via INTRAVENOUS
  Administered 2021-03-23: 300 mg via INTRAVENOUS

## 2021-03-23 MED ORDER — ONDANSETRON HCL 4 MG/2ML IJ SOLN
INTRAMUSCULAR | Status: AC
  Filled 2021-03-23: qty 2

## 2021-03-23 MED ORDER — BUPIVACAINE-EPINEPHRINE 0.25% -1:200000 IJ SOLN
INTRAMUSCULAR | Status: DC | PRN
  Administered 2021-03-23: 17 mL

## 2021-03-23 MED ORDER — HYDROCODONE-ACETAMINOPHEN 5-325 MG PO TABS: 1.0000 | ORAL_TABLET | Freq: Four times a day (QID) | ORAL | 0 refills | Status: AC | PRN

## 2021-03-23 SURGICAL SUPPLY — 42 items
APL PRP STRL LF DISP 70% ISPRP (MISCELLANEOUS) ×1
APL SKNCLS STERI-STRIP NONHPOA (GAUZE/BANDAGES/DRESSINGS)
BENZOIN TINCTURE PRP APPL 2/3 (GAUZE/BANDAGES/DRESSINGS) IMPLANT
BLADE SURG 15 STRL LF DISP TIS (BLADE) ×1 IMPLANT
BLADE SURG 15 STRL SS (BLADE) ×2
CANISTER SUCT 1200ML W/VALVE (MISCELLANEOUS) ×2 IMPLANT
CHLORAPREP W/TINT 26 (MISCELLANEOUS) ×2 IMPLANT
COVER BACK TABLE 60X90IN (DRAPES) ×2 IMPLANT
COVER MAYO STAND STRL (DRAPES) ×2 IMPLANT
DRAPE LAPAROTOMY 100X72 PEDS (DRAPES) ×2 IMPLANT
DRAPE UTILITY XL STRL (DRAPES) ×2 IMPLANT
ELECT COATED BLADE 2.86 ST (ELECTRODE) ×2 IMPLANT
ELECT REM PT RETURN 9FT ADLT (ELECTROSURGICAL) ×2
ELECTRODE REM PT RTRN 9FT ADLT (ELECTROSURGICAL) ×1 IMPLANT
GAUZE SPONGE 4X4 12PLY STRL (GAUZE/BANDAGES/DRESSINGS) ×2 IMPLANT
GAUZE XEROFORM 1X8 LF (GAUZE/BANDAGES/DRESSINGS) ×2 IMPLANT
GLOVE SRG 8 PF TXTR STRL LF DI (GLOVE) ×1 IMPLANT
GLOVE SURG LTX SZ8 (GLOVE) ×2 IMPLANT
GLOVE SURG UNDER POLY LF SZ8 (GLOVE) ×2
GOWN STRL REUS W/ TWL LRG LVL3 (GOWN DISPOSABLE) ×2 IMPLANT
GOWN STRL REUS W/ TWL XL LVL3 (GOWN DISPOSABLE) ×1 IMPLANT
GOWN STRL REUS W/TWL LRG LVL3 (GOWN DISPOSABLE) ×4
GOWN STRL REUS W/TWL XL LVL3 (GOWN DISPOSABLE) ×2
NEEDLE HYPO 25X1 1.5 SAFETY (NEEDLE) ×2 IMPLANT
NS IRRIG 1000ML POUR BTL (IV SOLUTION) ×2 IMPLANT
PACK BASIN DAY SURGERY FS (CUSTOM PROCEDURE TRAY) ×2 IMPLANT
PENCIL SMOKE EVACUATOR (MISCELLANEOUS) ×2 IMPLANT
SLEEVE SCD COMPRESS KNEE MED (STOCKING) ×2 IMPLANT
SPONGE LAP 4X18 RFD (DISPOSABLE) ×2 IMPLANT
STRIP CLOSURE SKIN 1/2X4 (GAUZE/BANDAGES/DRESSINGS) IMPLANT
SUT ETHILON 2 0 FS 18 (SUTURE) ×2 IMPLANT
SUT MON AB 4-0 PC3 18 (SUTURE) ×2 IMPLANT
SUT VIC AB 2-0 SH 27 (SUTURE)
SUT VIC AB 2-0 SH 27XBRD (SUTURE) IMPLANT
SUT VICRYL 3-0 CR8 SH (SUTURE) ×2 IMPLANT
SUT VICRYL AB 3 0 TIES (SUTURE) IMPLANT
SYR BULB EAR ULCER 3OZ GRN STR (SYRINGE) ×2 IMPLANT
SYR CONTROL 10ML LL (SYRINGE) ×2 IMPLANT
TOWEL GREEN STERILE FF (TOWEL DISPOSABLE) ×4 IMPLANT
TUBE CONNECTING 20X1/4 (TUBING) ×2 IMPLANT
UNDERPAD 30X36 HEAVY ABSORB (UNDERPADS AND DIAPERS) IMPLANT
YANKAUER SUCT BULB TIP NO VENT (SUCTIONS) IMPLANT

## 2021-03-23 NOTE — Op Note (Signed)
Preoperative diagnosis: 5 cm x 4 cm epidermal inclusion cyst upper back  Postoperative diagnosis: Same  Procedure: Excision of 5 cm x 4 cm subcutaneous epidermal inclusion cyst ruptured upper back  Surgeon: Erroll Luna, MD  Anesthesia: General endotracheal anesthesia it was 0.25% Marcaine plain  EBL: 20 cc  Specimen: Skin with cyst pathology  Drains: None  IV fluids: Per anesthesia record  Indications for procedure: The patient is a 74 year old male who who is a chronically inflamed epidermal inclusion cyst.  He presents today for excision.The procedure has been discussed with the patient.  Alternative therapies have been discussed with the patient.  Operative risks include bleeding,  Infection,  Organ injury,  Nerve injury,  Blood vessel injury,  DVT,  Pulmonary embolism,  Death,  And possible reoperation.  Medical management risks include worsening of present situation.  The success of the procedure is 50 -90 % at treating patients symptoms.  The patient understands and agrees to proceed.   Description of procedure: The patient was met in the holding area.  The cyst was marked on his upper central back.  All questions were answered.  He was brought to the operating.  He is placed supine upon the OR table.  After induction of general anesthesia, he was rolled up onto his right side.  He was prepped with pads and padded appropriately.  His upper back region was then prepped and draped in sterile fashion timeout performed.  Proper patient, site and procedure were verified and he received appropriate preoperative antibiotics.  Elliptical incision was made around the cyst and neighboring skin.  All tissue down to the subcutaneous fat were excised contained ruptured cyst.  Hemostasis achieved with cautery and local anesthetic was infiltrated the subcutaneous tissues.  Wound closed with a deep layer of 2-0 Vicryl.  2-0 nylon was used to approximate the skin.  Dry dressings applied.  All counts  were found to be correct.  The patient was awoke extubated taken to recovery in satisfactory condition.

## 2021-03-23 NOTE — Anesthesia Postprocedure Evaluation (Signed)
Anesthesia Post Note  Patient: Joel Rivera.  Procedure(s) Performed: SEBACEOUS CYST REMOVAL FROM BACK (N/A Back)     Patient location during evaluation: PACU Anesthesia Type: General Level of consciousness: awake and alert Pain management: pain level controlled Vital Signs Assessment: post-procedure vital signs reviewed and stable Respiratory status: spontaneous breathing, nonlabored ventilation, respiratory function stable and patient connected to nasal cannula oxygen Cardiovascular status: blood pressure returned to baseline and stable Postop Assessment: no apparent nausea or vomiting Anesthetic complications: no   No complications documented.  Last Vitals:  Vitals:   03/23/21 1520 03/23/21 1541  BP:  (!) 156/70  Pulse: 93 88  Resp: (!) 22 18  Temp:  36.8 C  SpO2: 94% 93%    Last Pain:  Vitals:   03/23/21 1541  TempSrc:   PainSc: 0-No pain                 Barnet [place]

## 2021-03-23 NOTE — Interval H&P Note (Signed)
History and Physical Interval Note:  03/23/2021 1:32 PM  Joel Rivera.  has presented today for surgery, with the diagnosis of SEBACEOUS CYST.  The various methods of treatment have been discussed with the patient and family. After consideration of risks, benefits and other options for treatment, the patient has consented to  Procedure(s) with comments: CYST REMOVAL SEBACEOUS ON BACK (N/A) - 60 MINUTE SURGERY BEGINNING AT 3:45 PM as a surgical intervention.  The patient's history has been reviewed, patient examined, no change in status, stable for surgery.  I have reviewed the patient's chart and labs.  Questions were answered to the patient's satisfaction.     [place]

## 2021-03-23 NOTE — Transfer of Care (Signed)
Immediate Anesthesia Transfer of Care Note  Patient: Fields Oros.  Procedure(s) Performed: SEBACEOUS CYST REMOVAL FROM BACK (N/A Back)  Patient Location: PACU  Anesthesia Type:General  Level of Consciousness: drowsy  Airway & Oxygen Therapy: Patient Spontanous Breathing and Patient connected to face mask oxygen  Post-op Assessment: Report given to RN and Post -op Vital signs reviewed and stable  Post vital signs: Reviewed and stable  Last Vitals:  Vitals Value Taken Time  BP 141/114 03/23/21 1510  Temp    Pulse 90 03/23/21 1512  Resp 23 03/23/21 1512  SpO2 96 % 03/23/21 1512  Vitals shown include unvalidated device data.  Last Pain:  Vitals:   03/23/21 1301  TempSrc: Oral         Complications: No complications documented.

## 2021-03-23 NOTE — Anesthesia Procedure Notes (Signed)
Procedure Name: Intubation Date/Time: 03/23/2021 2:08 PM Performed by: Bufford Spikes, CRNA Pre-anesthesia Checklist: Patient identified, Emergency Drugs available, Suction available and Patient being monitored Patient Re-evaluated:Patient Re-evaluated prior to induction Oxygen Delivery Method: Circle system utilized Preoxygenation: Pre-oxygenation with 100% oxygen Induction Type: IV induction Ventilation: Mask ventilation without difficulty Laryngoscope Size: Miller and 2 Grade View: Grade II Tube type: Oral Tube size: 7.0 mm Number of attempts: 1 Airway Equipment and Method: Stylet Placement Confirmation: ETT inserted through vocal cords under direct vision,  positive ETCO2 and breath sounds checked- equal and bilateral Secured at: 22 cm Tube secured with: Tape Dental Injury: Teeth and Oropharynx as per pre-operative assessment

## 2021-03-23 NOTE — Discharge Instructions (Signed)
GENERAL SURGERY: POST OP INSTRUCTIONS  ######################################################################  EAT Gradually transition to a high fiber diet with a fiber supplement over the next few weeks after discharge.  Start with a pureed / full liquid diet (see below)  WALK Walk an hour a day.  Control your pain to do that.    CONTROL PAIN Control pain so that you can walk, sleep, tolerate sneezing/coughing, go up/down stairs.  HAVE A BOWEL MOVEMENT DAILY Keep your bowels regular to avoid problems.  OK to try a laxative to override constipation.  OK to use an antidairrheal to slow down diarrhea.  Call if not better after 2 tries  CALL IF YOU HAVE PROBLEMS/CONCERNS Call if you are still struggling despite following these instructions. Call if you have concerns not answered by these instructions  ######################################################################    1. DIET: Follow a light bland diet & liquids the first 24 hours after arrival home, such as soup, liquids, starches, etc.  Be sure to drink plenty of fluids.  Quickly advance to a usual solid diet within a few days.  Avoid fast food or heavy meals as your are more likely to get nauseated or have irregular bowels.  A low-fat, high-fiber diet for the rest of your life is ideal.   2. Take your usually prescribed home medications unless otherwise directed. 3. PAIN CONTROL: a. Pain is best controlled by a usual combination of three different methods TOGETHER: i. Ice/Heat ii. Over the counter pain medication iii. Prescription pain medication b. Most patients will experience some swelling and bruising around the incisions.  Ice packs or heating pads (30-60 minutes up to 6 times a day) will help. Use ice for the first few days to help decrease swelling and bruising, then switch to heat to help relax tight/sore spots and speed recovery.  Some people prefer to use ice alone, heat alone, alternating between ice & heat.   Experiment to what works for you.  Swelling and bruising can take several weeks to resolve.   c. It is helpful to take an over-the-counter pain medication regularly for the first few weeks.  Choose one of the following that works best for you: i. Naproxen (Aleve, etc)  Two 220mg tabs twice a day ii. Ibuprofen (Advil, etc) Three 200mg tabs four times a day (every meal & bedtime) iii. Acetaminophen (Tylenol, etc) 500-650mg four times a day (every meal & bedtime) d. A  prescription for pain medication (such as oxycodone, hydrocodone, etc) should be given to you upon discharge.  Take your pain medication as prescribed.  i. If you are having problems/concerns with the prescription medicine (does not control pain, nausea, vomiting, rash, itching, etc), please call us [phone] to see if we need to switch you to a different pain medicine that will work better for you and/or control your side effect better. ii. If you need a refill on your pain medication, please contact your pharmacy.  They will contact our office to request authorization. Prescriptions will not be filled after 5 pm or on week-ends. 4. Avoid getting constipated.  Between the surgery and the pain medications, it is common to experience some constipation.  Increasing fluid intake and taking a fiber supplement (such as Metamucil, Citrucel, FiberCon, MiraLax, etc) 1-2 times a day regularly will usually help prevent this problem from occurring.  A mild laxative (prune juice, Milk of Magnesia, MiraLax, etc) should be taken according to package directions if there are no bowel movements after 48 hours.   5. Wash /   shower every day.  You may shower over the dressings as they are waterproof.  Continue to shower over incision(s) after the dressing is off. 6. Remove your waterproof bandages 5 days after surgery.  You may leave the incision open to air.  You may have skin tapes (Steri Strips) covering the incision(s).  Leave them on until one week, then  remove.  You may replace a dressing/Band-Aid to cover the incision for comfort if you wish.      7. ACTIVITIES as tolerated:   a. You may resume regular (light) daily activities beginning the next day--such as daily self-care, walking, climbing stairs--gradually increasing activities as tolerated.  If you can walk 30 minutes without difficulty, it is safe to try more intense activity such as jogging, treadmill, bicycling, low-impact aerobics, swimming, etc. b. Save the most intensive and strenuous activity for last such as sit-ups, heavy lifting, contact sports, etc  Refrain from any heavy lifting or straining until you are off narcotics for pain control.   c. DO NOT PUSH THROUGH PAIN.  Let pain be your guide: If it hurts to do something, don't do it.  Pain is your body warning you to avoid that activity for another week until the pain goes down. d. You may drive when you are no longer taking prescription pain medication, you can comfortably wear a seatbelt, and you can safely maneuver your car and apply brakes. e. You may have sexual intercourse when it is comfortable.  8. FOLLOW UP in our office a. Please call [facility] at [phone] to set up an appointment to see your surgeon in the office for a follow-up appointment approximately 2-3 weeks after your surgery. b. Make sure that you call for this appointment the day you arrive home to insure a convenient appointment time. 9. IF YOU HAVE DISABILITY OR FAMILY LEAVE FORMS, BRING THEM TO THE OFFICE FOR PROCESSING.  DO NOT GIVE THEM TO YOUR DOCTOR.   WHEN TO CALL US [phone]: 1. Poor pain control 2. Reactions / problems with new medications (rash/itching, nausea, etc)  3. Fever over 101.5 F (38.5 C) 4. Worsening swelling or bruising 5. Continued bleeding from incision. 6. Increased pain, redness, or drainage from the incision 7. Difficulty breathing / swallowing   The clinic staff is available to answer your questions during regular  business hours (8:30am-5pm).  Please don't hesitate to call and ask to speak to one of our nurses for clinical concerns.   If you have a medical emergency, go to the nearest emergency room or call 911.  A surgeon from [facility] is always on call at the hospitals   [facility] [address], [address], [place], [place]  [address] ? MAIN: [phone] ? TOLL FREE: [phone] ?  FAX [phone] [email or web address]    Post Anesthesia Home Care Instructions  Activity: Get plenty of rest for the remainder of the day. A responsible individual must stay with you for 24 hours following the procedure.  For the next 24 hours, DO NOT: -Drive a car -Operate machinery -Drink alcoholic beverages -Take any medication unless instructed by your physician -Make any legal decisions or sign important papers.  Meals: Start with liquid foods such as gelatin or soup. Progress to regular foods as tolerated. Avoid greasy, spicy, heavy foods. If nausea and/or vomiting occur, drink only clear liquids until the nausea and/or vomiting subsides. Call your physician if vomiting continues.  Special Instructions/Symptoms: Your throat may feel dry or   from the anesthesia or the breathing tube placed in your throat during surgery. If this causes discomfort, gargle with warm salt water. The discomfort should disappear within 24 hours.  If you had a scopolamine patch placed behind your ear for the management of post- operative nausea and/or vomiting:  1. The medication in the patch is effective for 72 hours, after which it should be removed.  Wrap patch in a tissue and discard in the trash. Wash hands thoroughly with soap and water. 2. You may remove the patch earlier than 72 hours if you experience unpleasant side effects which may include dry mouth, dizziness or visual disturbances. 3. Avoid touching the patch. Wash your hands with soap and water after contact  with the patch.    Post Anesthesia Home Care Instructions  Activity: Get plenty of rest for the remainder of the day. A responsible individual must stay with you for 24 hours following the procedure.  For the next 24 hours, DO NOT: -Drive a car -Paediatric nurse -Drink alcoholic beverages -Take any medication unless instructed by your physician -Make any legal decisions or sign important papers.  Meals: Start with liquid foods such as gelatin or soup. Progress to regular foods as tolerated. Avoid greasy, spicy, heavy foods. If nausea and/or vomiting occur, drink only clear liquids until the nausea and/or vomiting subsides. Call your physician if vomiting continues.  Special Instructions/Symptoms: Your throat may feel dry or sore from the anesthesia or the breathing tube placed in your throat during surgery. If this causes discomfort, gargle with warm salt water. The discomfort should disappear within 24 hours.  If you had a scopolamine patch placed behind your ear for the management of post- operative nausea and/or vomiting:  1. The medication in the patch is effective for 72 hours, after which it should be removed.  Wrap patch in a tissue and discard in the trash. Wash hands thoroughly with soap and water. 2. You may remove the patch earlier than 72 hours if you experience unpleasant side effects which may include dry mouth, dizziness or visual disturbances. 3. Avoid touching the patch. Wash your hands with soap and water after contact with the patch.

## 2021-03-24 ENCOUNTER — Encounter (HOSPITAL_BASED_OUTPATIENT_CLINIC_OR_DEPARTMENT_OTHER): Payer: Self-pay | Admitting: Surgery

## 2021-03-27 LAB — SURGICAL PATHOLOGY

## 2021-04-07 ENCOUNTER — Ambulatory Visit: Payer: Medicare Other | Admitting: Interventional Cardiology

## 2021-05-02 DIAGNOSIS — E1165 Type 2 diabetes mellitus with hyperglycemia: Secondary | ICD-10-CM | POA: Diagnosis not present

## 2021-05-02 DIAGNOSIS — E785 Hyperlipidemia, unspecified: Secondary | ICD-10-CM | POA: Diagnosis not present

## 2021-05-02 DIAGNOSIS — I10 Essential (primary) hypertension: Secondary | ICD-10-CM | POA: Diagnosis not present

## 2021-05-02 DIAGNOSIS — Z79899 Other long term (current) drug therapy: Secondary | ICD-10-CM | POA: Diagnosis not present

## 2021-05-04 DIAGNOSIS — E785 Hyperlipidemia, unspecified: Secondary | ICD-10-CM | POA: Diagnosis not present

## 2021-05-04 DIAGNOSIS — E119 Type 2 diabetes mellitus without complications: Secondary | ICD-10-CM | POA: Diagnosis not present

## 2021-05-04 DIAGNOSIS — I25119 Atherosclerotic heart disease of native coronary artery with unspecified angina pectoris: Secondary | ICD-10-CM | POA: Diagnosis not present

## 2021-05-04 DIAGNOSIS — I10 Essential (primary) hypertension: Secondary | ICD-10-CM | POA: Diagnosis not present

## 2021-05-22 DIAGNOSIS — E785 Hyperlipidemia, unspecified: Secondary | ICD-10-CM | POA: Diagnosis not present

## 2021-05-22 DIAGNOSIS — I10 Essential (primary) hypertension: Secondary | ICD-10-CM | POA: Diagnosis not present

## 2021-05-22 DIAGNOSIS — M545 Low back pain, unspecified: Secondary | ICD-10-CM | POA: Diagnosis not present

## 2021-05-22 DIAGNOSIS — I739 Peripheral vascular disease, unspecified: Secondary | ICD-10-CM | POA: Diagnosis not present

## 2021-05-22 DIAGNOSIS — E1165 Type 2 diabetes mellitus with hyperglycemia: Secondary | ICD-10-CM | POA: Diagnosis not present

## 2021-08-03 DIAGNOSIS — I10 Essential (primary) hypertension: Secondary | ICD-10-CM | POA: Diagnosis not present

## 2021-08-03 DIAGNOSIS — E119 Type 2 diabetes mellitus without complications: Secondary | ICD-10-CM | POA: Diagnosis not present

## 2021-08-03 DIAGNOSIS — E785 Hyperlipidemia, unspecified: Secondary | ICD-10-CM | POA: Diagnosis not present

## 2021-08-05 NOTE — Progress Notes (Signed)
Cardiology Office Note:    Date:  08/07/2021   ID:  Joel Rivera., DOB 01/05/1946, MRN [id number]  PCP:  Jani Gravel, MD  Cardiologist:  Sinclair Grooms, MD   Referring MD: Jani Gravel, MD   Chief Complaint  Patient presents with   Coronary Artery Disease   Claudication   Hypertension   Hyperlipidemia     History of Present Illness:    Joel Rivera. is a 75 y.o. male with a hx of CAD (no real specifics noted), essential hypertension with severe LVH on echo, diastolic dysfunction, obesity, hyperlipidemia, OSA - on CPAP and claudication/PVD. He has PTSD   He has no angina, exertional dyspnea, orthopnea, PND, or lower extremity edema.  He denies nitroglycerin use.  Overall, he is exercising more since having lower extremity PVD procedures to improve blood flow and decrease claudication.  He endorses a 40 pound weight loss.  He is not walking without claudication.  Past Medical History:  Diagnosis Date   Agent orange exposure    Allergies    Arthritis    "neck; left shoulder" (10/25/2016)   CAD (coronary artery disease)    Carotid artery disease (HCC)    CHF (congestive heart failure) (HCC)    Chronic post-traumatic stress disorder (PTSD) 03/16/2015   Chronic sinus infection    CKD (chronic kidney disease), stage III ([place])    Claustrophobia 03/16/2015   Depression    Diabetic neuropathy (HCC)    Former tobacco use    GERD (gastroesophageal reflux disease)    Hyperlipidemia    Hypertension    Morbid obesity (HCC)    OSA (obstructive sleep apnea)    "been checked out several times; each time never got any treatment ordered" (10/25/2016)   PAD (peripheral artery disease) ([place])    a. Diagnostic PV Angio 08/2016 -> s/p RLE PTA in 09/2016. b. s/p LLE PTA in 10/2016.   PTSD (post-traumatic stress disorder)    "I'm a Veteran; 100% disability"   Recurrent boils    abceses   Type II diabetes mellitus ([place])     Past Surgical History:  Procedure Laterality Date   ABCESS  DRAINAGE     "fluid-filled boils removed off my upper back, left leg; and back of my neck"   ABDOMINAL HERNIA REPAIR  ~ 2008   CATARACT EXTRACTION W/ INTRAOCULAR LENS  IMPLANT, BILATERAL Bilateral    COLONOSCOPY W/ BIOPSIES AND POLYPECTOMY     "was gettin colonoscopy q 5 yrs; 11 polyps found in Dec. 2016; going back in Feb 2018"   CORONARY ANGIOPLASTY  ~ 2004   CYST EXCISION Left 2008   inguinal area   CYST EXCISION N/A 03/23/2021   Procedure: SEBACEOUS CYST REMOVAL FROM BACK;  Surgeon: Erroll Luna, MD;  Location: [facility];  Service: General;  Laterality: N/A;   HERNIA REPAIR     "stomach; it's coming back again" (10/25/2016)   PERIPHERAL VASCULAR CATHETERIZATION N/A 08/27/2016   Procedure: Lower Extremity Angiography;  Surgeon: Lorretta Harp, MD;  Location: [facility];  Service: Cardiovascular;  Laterality: N/A;   PERIPHERAL VASCULAR CATHETERIZATION N/A 09/17/2016   Procedure: Lower Extremity Angiography;  Surgeon: Lorretta Harp, MD;  Location: [facility];  Service: Cardiovascular;  Laterality: N/A;   PERIPHERAL VASCULAR CATHETERIZATION Right 09/17/2016   Procedure: Peripheral Vascular Atherectomy;  Surgeon: Lorretta Harp, MD;  Location: [facility];  Service: Cardiovascular;  Laterality: Right;  Common Femoral    PERIPHERAL  VASCULAR CATHETERIZATION Left 10/25/2016   Procedure: Peripheral Vascular Atherectomy;  Surgeon: Lorretta Harp, MD;  Location: [facility];  Service: Cardiovascular;  Laterality: Left;  SFA    Current Medications: Current Meds  Medication Sig   acetaminophen (TYLENOL) 325 MG tablet Take 650 mg by mouth every 6 (six) hours as needed (for pain.).   aspirin EC 81 MG tablet Take 81 mg by mouth daily.   carvedilol (COREG) 25 MG tablet Take 1 tablet (25 mg total) by mouth 2 (two) times daily with a meal.   cetirizine (ZYRTEC) 10 MG tablet Take 10 mg by mouth daily.   chlorthalidone (HYGROTON) 50 MG tablet Take  25 mg by mouth daily.    cholecalciferol (VITAMIN D) 1000 UNITS tablet Take 1,000 Units by mouth daily.   citalopram (CELEXA) 20 MG tablet Take 10 mg by mouth daily.   diclofenac Sodium (VOLTAREN) 1 % GEL Apply 4 g topically 4 (four) times daily.   fluticasone (FLONASE) 50 MCG/ACT nasal spray Place 2 sprays into both nostrils daily.   gabapentin (NEURONTIN) 300 MG capsule Take 300 mg by mouth 2 (two) times daily.   guaiFENesin (MUCINEX) 600 MG 12 hr tablet Take 600 mg by mouth daily as needed for cough or to loosen phlegm.   HYDROcodone-acetaminophen (NORCO/VICODIN) 5-325 MG tablet Take 1 tablet by mouth every 6 (six) hours as needed for moderate pain.   Insulin Glargine-Lixisenatide 100-33 UNT-MCG/ML SOPN Inject 50 Units into the skin daily.    ipratropium (ATROVENT) 0.06 % nasal spray as needed.   JARDIANCE 25 MG TABS tablet Take 25 mg by mouth daily.    linaclotide (LINZESS) 145 MCG CAPS capsule as needed.   losartan (COZAAR) 100 MG tablet Take 1 tablet (100 mg total) by mouth daily.   pantoprazole (PROTONIX) 40 MG tablet Take 40 mg by mouth daily.   polyethylene glycol powder (GLYCOLAX/MIRALAX) 17 GM/SCOOP powder Take 17 g by mouth daily.   rosuvastatin (CRESTOR) 40 MG tablet Take 40 mg by mouth daily at 12 noon.    sildenafil (VIAGRA) 100 MG tablet Take 100 mg by mouth as needed.   sodium chloride (OCEAN) 0.65 % SOLN nasal spray Place 1 spray into both nostrils 3 (three) times daily as needed for congestion.     Allergies:   Fluticasone, Atorvastatin, Fosinopril, Pravastatin, Simvastatin, and Terazosin   Social History   Socioeconomic History   Marital status: Married    Spouse name: Not on file   Number of children: Not on file   Years of education: Not on file   Highest education level: Not on file  Occupational History   Not on file  Tobacco Use   Smoking status: Former    Packs/day: 3.00    Years: 13.00    Pack years: 39.00    Types: Cigarettes    Quit date: 60     Years since quitting: 36.7   Smokeless tobacco: Never  Substance and Sexual Activity   Alcohol use: Yes    Alcohol/week: 0.0 standard drinks    Comment: 10/25/2016 "quit years ago"   Drug use: No   Sexual activity: Yes    Birth control/protection: None    Comment: Married  Other Topics Concern   Not on file  Social History Narrative   No caffeine.   Social Determinants of Health   Financial Resource Strain: Not on file  Food Insecurity: Not on file  Transportation Needs: Not on file  Physical Activity: Not on file  Stress: Not on file  Social Connections: Not on file     Family History: The patient's family history includes Aneurysm in his father; Dementia in his sister; Healthy in his sister; Pneumonia in his mother; Sarcoidosis in his sister; Stroke in his sister; Urinary tract infection in his mother.  ROS:   Please see the history of present illness.    Complains of nasal congestion with drainage and "indigestion.  All other systems reviewed and are negative.  EKGs/Labs/Other Studies Reviewed:    The following studies were reviewed today: No new cardiac data.  EKG:  EKG first-degree AV block with QRST 276 ms.  Prior 246 ms and October 2021.  Left ventricular hypertrophy, left atrial abnormality.  He is on high-dose carvedilol therapy.  Recent Labs: 03/21/2021: ALT 17; BUN 14; Creatinine, Ser 1.32; Hemoglobin 13.8; Platelets 179; Potassium 3.6; Sodium 135  Recent Lipid Panel No results found for: CHOL, TRIG, HDL, CHOLHDL, VLDL, LDLCALC, LDLDIRECT  Physical Exam:    VS:  BP (!) 142/68   Pulse 77   Ht 5\' 8"  (1.727 m)   Wt 243 lb 3.2 oz (110.3 kg)   SpO2 97%   BMI 36.98 kg/m     Wt Readings from Last 3 Encounters:  08/07/21 243 lb 3.2 oz (110.3 kg)  03/23/21 265 lb 14 oz (120.6 kg)  08/26/20 271 lb (122.9 kg)     GEN: Obese but with 30 pound weight loss since October 2021.Marland Kitchen No acute distress HEENT: Normal NECK: No JVD. LYMPHATICS: No  lymphadenopathy CARDIAC: No murmur. RRR no gallop, or edema. VASCULAR:  Normal Pulses. No bruits. RESPIRATORY:  Clear to auscultation without rales, wheezing or rhonchi  ABDOMEN: Soft, non-tender, non-distended, No pulsatile mass, MUSCULOSKELETAL: No deformity  SKIN: Warm and dry NEUROLOGIC:  Alert and oriented x 3 PSYCHIATRIC:  Normal affect   ASSESSMENT:    1. Coronary artery disease involving native coronary artery of native heart without angina pectoris   2. Primary hypertension   3. Claudication ([place])   4. Mixed hyperlipidemia   5. OSA (obstructive sleep apnea)   6. First degree AV block    PLAN:    In order of problems listed above:  Secondary prevention discussed.  We discussed better A1c/diabetes control.  Advocated continued physical activity. 130/80 mmHg.  Low-salt diet and continued weight loss Secondary prevention.  Continue aerobic activity. Excellent on high intensity statin therapy. Encouraged compliance with CPAP. Significant first-degree AV block.  Need to consider decreasing the dose of carvedilol.  We will asked the patient to monitor heart rates.  If heart rates get into the 50s, the dose will need to be decreased by 50%.  Overall education and awareness concerning secondary risk prevention was discussed in detail: LDL less than 70, hemoglobin A1c less than 7, blood pressure target less than 130/80 mmHg, >150 minutes of moderate aerobic activity per week, avoidance of smoking, weight control (via diet and exercise), and continued surveillance/management of/for obstructive sleep apnea.    Medication Adjustments/Labs and Tests Ordered: Current medicines are reviewed at length with the patient today.  Concerns regarding medicines are outlined above.  Orders Placed This Encounter  Procedures   EKG 12-Lead   No orders of the defined types were placed in this encounter.   Patient Instructions  Medication Instructions:  Your physician recommends that you  continue on your current medications as directed. Please refer to the Current Medication list given to you today.  *If you need a refill on your cardiac medications  before your next appointment, please call your pharmacy*   Lab Work: None If you have labs (blood work) drawn today and your tests are completely normal, you will receive your results only by: [place] (if you have MyChart) OR A paper copy in the mail If you have any lab test that is abnormal or we need to change your treatment, we will call you to review the results.   Testing/Procedures: None   Follow-Up: At [facility], you and your health needs are our priority.  As part of our continuing mission to provide you with exceptional heart care, we have created designated Provider Care Teams.  These Care Teams include your primary Cardiologist (physician) and Advanced Practice Providers (APPs -  Physician Assistants and Nurse Practitioners) who all work together to provide you with the care you need, when you need it.  We recommend signing up for the patient portal called "MyChart".  Sign up information is provided on this After Visit Summary.  MyChart is used to connect with patients for Virtual Visits (Telemedicine).  Patients are able to view lab/test results, encounter notes, upcoming appointments, etc.  Non-urgent messages can be sent to your provider as well.   To learn more about what you can do with MyChart, go to [email or web address].    Your next appointment:   1 year(s)  The format for your next appointment:   In Person  Provider:   You may see Sinclair Grooms, MD or one of the following Advanced Practice Providers on your designated Care Team:   Cecilie Kicks, NP   Other Instructions     Signed, Sinclair Grooms, MD  08/07/2021 11:21 AM    [place]

## 2021-08-07 ENCOUNTER — Ambulatory Visit: Payer: Medicare Other | Admitting: Interventional Cardiology

## 2021-08-07 ENCOUNTER — Telehealth: Payer: Self-pay | Admitting: Interventional Cardiology

## 2021-08-07 ENCOUNTER — Other Ambulatory Visit: Payer: Self-pay

## 2021-08-07 ENCOUNTER — Encounter: Payer: Self-pay | Admitting: Interventional Cardiology

## 2021-08-07 VITALS — BP 142/68 | HR 77 | Ht 68.0 in | Wt 243.2 lb

## 2021-08-07 DIAGNOSIS — E782 Mixed hyperlipidemia: Secondary | ICD-10-CM

## 2021-08-07 DIAGNOSIS — I440 Atrioventricular block, first degree: Secondary | ICD-10-CM | POA: Diagnosis not present

## 2021-08-07 DIAGNOSIS — I739 Peripheral vascular disease, unspecified: Secondary | ICD-10-CM | POA: Diagnosis not present

## 2021-08-07 DIAGNOSIS — I10 Essential (primary) hypertension: Secondary | ICD-10-CM | POA: Diagnosis not present

## 2021-08-07 DIAGNOSIS — I2510 Atherosclerotic heart disease of native coronary artery without angina pectoris: Secondary | ICD-10-CM | POA: Diagnosis not present

## 2021-08-07 DIAGNOSIS — G4733 Obstructive sleep apnea (adult) (pediatric): Secondary | ICD-10-CM | POA: Diagnosis not present

## 2021-08-07 NOTE — Patient Instructions (Signed)

## 2021-08-07 NOTE — Telephone Encounter (Signed)
After pt left, Dr Tamala Julian asked that I contact him and advise him to monitor HR at least 3-4 times a quarter and report to [place] if consistently in the 50's.  Called pt and left message to call back.

## 2021-08-07 NOTE — Telephone Encounter (Signed)
Spoke with pt and reviewed recommendations per Dr. Tamala Julian.  Pt mentioned that he does get several readings in the 54s.  Advised pt to monitor 2-3 times per week for 1 month and call with those readings.  Pt agreeable to plan.

## 2021-08-09 DIAGNOSIS — I25119 Atherosclerotic heart disease of native coronary artery with unspecified angina pectoris: Secondary | ICD-10-CM | POA: Diagnosis not present

## 2021-08-09 DIAGNOSIS — E785 Hyperlipidemia, unspecified: Secondary | ICD-10-CM | POA: Diagnosis not present

## 2021-08-09 DIAGNOSIS — E119 Type 2 diabetes mellitus without complications: Secondary | ICD-10-CM | POA: Diagnosis not present

## 2021-08-09 DIAGNOSIS — I10 Essential (primary) hypertension: Secondary | ICD-10-CM | POA: Diagnosis not present

## 2021-09-04 DIAGNOSIS — I10 Essential (primary) hypertension: Secondary | ICD-10-CM | POA: Diagnosis not present

## 2021-09-04 DIAGNOSIS — E1165 Type 2 diabetes mellitus with hyperglycemia: Secondary | ICD-10-CM | POA: Diagnosis not present

## 2021-09-04 DIAGNOSIS — I739 Peripheral vascular disease, unspecified: Secondary | ICD-10-CM | POA: Diagnosis not present

## 2021-09-04 DIAGNOSIS — E785 Hyperlipidemia, unspecified: Secondary | ICD-10-CM | POA: Diagnosis not present

## 2021-09-04 DIAGNOSIS — M545 Low back pain, unspecified: Secondary | ICD-10-CM | POA: Diagnosis not present

## 2021-09-11 DIAGNOSIS — I10 Essential (primary) hypertension: Secondary | ICD-10-CM | POA: Diagnosis not present

## 2021-09-11 DIAGNOSIS — E782 Mixed hyperlipidemia: Secondary | ICD-10-CM | POA: Diagnosis not present

## 2021-09-11 DIAGNOSIS — Z794 Long term (current) use of insulin: Secondary | ICD-10-CM | POA: Diagnosis not present

## 2021-09-11 DIAGNOSIS — I2510 Atherosclerotic heart disease of native coronary artery without angina pectoris: Secondary | ICD-10-CM | POA: Diagnosis not present

## 2021-09-11 DIAGNOSIS — Z0000 Encounter for general adult medical examination without abnormal findings: Secondary | ICD-10-CM | POA: Diagnosis not present

## 2021-09-11 DIAGNOSIS — E1142 Type 2 diabetes mellitus with diabetic polyneuropathy: Secondary | ICD-10-CM | POA: Diagnosis not present

## 2021-09-11 DIAGNOSIS — N182 Chronic kidney disease, stage 2 (mild): Secondary | ICD-10-CM | POA: Diagnosis not present

## 2021-09-11 DIAGNOSIS — I739 Peripheral vascular disease, unspecified: Secondary | ICD-10-CM | POA: Diagnosis not present

## 2021-09-13 ENCOUNTER — Other Ambulatory Visit (HOSPITAL_COMMUNITY): Payer: Self-pay | Admitting: Cardiovascular Disease

## 2021-09-13 DIAGNOSIS — I739 Peripheral vascular disease, unspecified: Secondary | ICD-10-CM

## 2021-09-19 DIAGNOSIS — N183 Chronic kidney disease, stage 3 unspecified: Secondary | ICD-10-CM | POA: Diagnosis not present

## 2021-09-19 DIAGNOSIS — I739 Peripheral vascular disease, unspecified: Secondary | ICD-10-CM | POA: Diagnosis not present

## 2021-09-19 DIAGNOSIS — I129 Hypertensive chronic kidney disease with stage 1 through stage 4 chronic kidney disease, or unspecified chronic kidney disease: Secondary | ICD-10-CM | POA: Diagnosis not present

## 2021-09-19 DIAGNOSIS — E1122 Type 2 diabetes mellitus with diabetic chronic kidney disease: Secondary | ICD-10-CM | POA: Diagnosis not present

## 2021-09-25 ENCOUNTER — Encounter (HOSPITAL_COMMUNITY): Payer: Medicare Other

## 2021-09-27 ENCOUNTER — Other Ambulatory Visit: Payer: Self-pay

## 2021-09-27 ENCOUNTER — Ambulatory Visit (HOSPITAL_COMMUNITY)
Admission: RE | Admit: 2021-09-27 | Discharge: 2021-09-27 | Disposition: A | Discharge status: Home / Self Care | Payer: Medicare Other | Source: Ambulatory Visit | Attending: Cardiovascular Disease | Admitting: Cardiovascular Disease

## 2021-09-27 DIAGNOSIS — I739 Peripheral vascular disease, unspecified: Secondary | ICD-10-CM | POA: Insufficient documentation

## 2021-11-01 ENCOUNTER — Emergency Department (HOSPITAL_COMMUNITY): Payer: No Typology Code available for payment source

## 2021-11-01 ENCOUNTER — Encounter (HOSPITAL_COMMUNITY): Payer: Self-pay

## 2021-11-01 ENCOUNTER — Other Ambulatory Visit: Payer: Self-pay

## 2021-11-01 ENCOUNTER — Inpatient Hospital Stay (HOSPITAL_COMMUNITY)
Admission: EM | Admit: 2021-11-01 | Discharge: 2021-11-04 | DRG: 312 | Disposition: A | Discharge status: Home / Self Care | Payer: No Typology Code available for payment source | Attending: Internal Medicine | Admitting: Internal Medicine

## 2021-11-01 ENCOUNTER — Other Ambulatory Visit (HOSPITAL_COMMUNITY): Payer: Self-pay | Admitting: Cardiovascular Disease

## 2021-11-01 DIAGNOSIS — E1122 Type 2 diabetes mellitus with diabetic chronic kidney disease: Secondary | ICD-10-CM | POA: Diagnosis present

## 2021-11-01 DIAGNOSIS — I440 Atrioventricular block, first degree: Secondary | ICD-10-CM | POA: Diagnosis present

## 2021-11-01 DIAGNOSIS — Z20822 Contact with and (suspected) exposure to covid-19: Secondary | ICD-10-CM | POA: Diagnosis present

## 2021-11-01 DIAGNOSIS — Z743 Need for continuous supervision: Secondary | ICD-10-CM | POA: Diagnosis not present

## 2021-11-01 DIAGNOSIS — D631 Anemia in chronic kidney disease: Secondary | ICD-10-CM | POA: Diagnosis present

## 2021-11-01 DIAGNOSIS — R231 Pallor: Secondary | ICD-10-CM | POA: Diagnosis not present

## 2021-11-01 DIAGNOSIS — E860 Dehydration: Secondary | ICD-10-CM | POA: Diagnosis present

## 2021-11-01 DIAGNOSIS — Z961 Presence of intraocular lens: Secondary | ICD-10-CM | POA: Diagnosis present

## 2021-11-01 DIAGNOSIS — E785 Hyperlipidemia, unspecified: Secondary | ICD-10-CM | POA: Diagnosis present

## 2021-11-01 DIAGNOSIS — R404 Transient alteration of awareness: Secondary | ICD-10-CM | POA: Diagnosis not present

## 2021-11-01 DIAGNOSIS — Z87891 Personal history of nicotine dependence: Secondary | ICD-10-CM

## 2021-11-01 DIAGNOSIS — R6889 Other general symptoms and signs: Secondary | ICD-10-CM | POA: Diagnosis not present

## 2021-11-01 DIAGNOSIS — R55 Syncope and collapse: Secondary | ICD-10-CM | POA: Diagnosis present

## 2021-11-01 DIAGNOSIS — Z794 Long term (current) use of insulin: Secondary | ICD-10-CM

## 2021-11-01 DIAGNOSIS — I951 Orthostatic hypotension: Principal | ICD-10-CM | POA: Diagnosis present

## 2021-11-01 DIAGNOSIS — Z888 Allergy status to other drugs, medicaments and biological substances status: Secondary | ICD-10-CM

## 2021-11-01 DIAGNOSIS — E1140 Type 2 diabetes mellitus with diabetic neuropathy, unspecified: Secondary | ICD-10-CM | POA: Diagnosis present

## 2021-11-01 DIAGNOSIS — I739 Peripheral vascular disease, unspecified: Secondary | ICD-10-CM | POA: Diagnosis present

## 2021-11-01 DIAGNOSIS — K219 Gastro-esophageal reflux disease without esophagitis: Secondary | ICD-10-CM | POA: Diagnosis present

## 2021-11-01 DIAGNOSIS — Z7982 Long term (current) use of aspirin: Secondary | ICD-10-CM

## 2021-11-01 DIAGNOSIS — N183 Chronic kidney disease, stage 3 unspecified: Secondary | ICD-10-CM | POA: Diagnosis present

## 2021-11-01 DIAGNOSIS — G4733 Obstructive sleep apnea (adult) (pediatric): Secondary | ICD-10-CM | POA: Diagnosis present

## 2021-11-01 DIAGNOSIS — Z6836 Body mass index (BMI) 36.0-36.9, adult: Secondary | ICD-10-CM

## 2021-11-01 DIAGNOSIS — R42 Dizziness and giddiness: Secondary | ICD-10-CM | POA: Diagnosis not present

## 2021-11-01 DIAGNOSIS — Z7984 Long term (current) use of oral hypoglycemic drugs: Secondary | ICD-10-CM

## 2021-11-01 DIAGNOSIS — Z79899 Other long term (current) drug therapy: Secondary | ICD-10-CM

## 2021-11-01 DIAGNOSIS — E1151 Type 2 diabetes mellitus with diabetic peripheral angiopathy without gangrene: Secondary | ICD-10-CM | POA: Diagnosis present

## 2021-11-01 DIAGNOSIS — I130 Hypertensive heart and chronic kidney disease with heart failure and stage 1 through stage 4 chronic kidney disease, or unspecified chronic kidney disease: Secondary | ICD-10-CM | POA: Diagnosis present

## 2021-11-01 DIAGNOSIS — F4312 Post-traumatic stress disorder, chronic: Secondary | ICD-10-CM | POA: Diagnosis present

## 2021-11-01 DIAGNOSIS — I10 Essential (primary) hypertension: Secondary | ICD-10-CM | POA: Diagnosis present

## 2021-11-01 DIAGNOSIS — I5032 Chronic diastolic (congestive) heart failure: Secondary | ICD-10-CM | POA: Diagnosis present

## 2021-11-01 DIAGNOSIS — I2510 Atherosclerotic heart disease of native coronary artery without angina pectoris: Secondary | ICD-10-CM | POA: Diagnosis present

## 2021-11-01 DIAGNOSIS — N1831 Chronic kidney disease, stage 3a: Secondary | ICD-10-CM | POA: Diagnosis present

## 2021-11-01 DIAGNOSIS — E871 Hypo-osmolality and hyponatremia: Secondary | ICD-10-CM | POA: Diagnosis present

## 2021-11-01 DIAGNOSIS — N179 Acute kidney failure, unspecified: Secondary | ICD-10-CM | POA: Diagnosis present

## 2021-11-01 LAB — COMPREHENSIVE METABOLIC PANEL
ALT: 15 U/L (ref 0–44)
AST: 19 U/L (ref 15–41)
Albumin: 3.2 g/dL — ABNORMAL LOW (ref 3.5–5.0)
Alkaline Phosphatase: 89 U/L (ref 38–126)
Anion gap: 9 (ref 5–15)
BUN: 18 mg/dL (ref 8–23)
CO2: 25 mmol/L (ref 22–32)
Calcium: 9.1 mg/dL (ref 8.9–10.3)
Chloride: 99 mmol/L (ref 98–111)
Creatinine, Ser: 1.48 mg/dL — ABNORMAL HIGH (ref 0.61–1.24)
GFR, Estimated: 48 mL/min — ABNORMAL LOW (ref >60)
Glucose, Bld: 118 mg/dL — ABNORMAL HIGH (ref 70–99)
Potassium: 3.9 mmol/L (ref 3.5–5.1)
Sodium: 133 mmol/L — ABNORMAL LOW (ref 135–145)
Total Bilirubin: 1.3 mg/dL — ABNORMAL HIGH (ref 0.3–1.2)
Total Protein: 8.2 g/dL — ABNORMAL HIGH (ref 6.5–8.1)

## 2021-11-01 LAB — CBC WITH DIFFERENTIAL/PLATELET
Abs Immature Granulocytes: 0.07 10*3/uL (ref 0.00–0.07)
Basophils Absolute: 0 10*3/uL (ref 0.0–0.1)
Basophils Relative: 0 %
Eosinophils Absolute: 0 10*3/uL (ref 0.0–0.5)
Eosinophils Relative: 0 %
HCT: 39.5 % (ref 39.0–52.0)
Hemoglobin: 12.9 g/dL — ABNORMAL LOW (ref 13.0–17.0)
Immature Granulocytes: 1 %
Lymphocytes Relative: 17 %
Lymphs Abs: 1.9 10*3/uL (ref 0.7–4.0)
MCH: 29.8 pg (ref 26.0–34.0)
MCHC: 32.7 g/dL (ref 30.0–36.0)
MCV: 91.2 fL (ref 80.0–100.0)
Monocytes Absolute: 1.4 10*3/uL — ABNORMAL HIGH (ref 0.1–1.0)
Monocytes Relative: 12 %
Neutro Abs: 8 10*3/uL — ABNORMAL HIGH (ref 1.7–7.7)
Neutrophils Relative %: 70 %
Platelets: 176 10*3/uL (ref 150–400)
RBC: 4.33 MIL/uL (ref 4.22–5.81)
RDW: 13 % (ref 11.5–15.5)
WBC: 11.4 10*3/uL — ABNORMAL HIGH (ref 4.0–10.5)
nRBC: 0 % (ref 0.0–0.2)

## 2021-11-01 LAB — TROPONIN I (HIGH SENSITIVITY): Troponin I (High Sensitivity): 26 ng/L — ABNORMAL HIGH (ref <18)

## 2021-11-01 NOTE — ED Notes (Signed)
[phone] is "alternate" number per granddaughter

## 2021-11-01 NOTE — ED Provider Notes (Signed)
[facility] EMERGENCY DEPARTMENT Provider Note   CSN: [id number] Arrival date & time: 11/01/21  1805     History Chief Complaint  Patient presents with   Loss of Consciousness   Head Injury    Steen Bisig. is a 75 y.o. male.  Pt reports he was Christmas shopping at [employer] and felt hot and sweaty.  Pt reports he passed out.  Pt denies headache of chest pain or headache.  Pt report he did not eat or drink anything today.  Pt is diabetic.  He reports he did not take his medication today,  Pt reports he has had similar episodes in the past. (Pt reports he passed out from sinus problems in the past)    The history is provided by the patient. No language interpreter was used.  Loss of Consciousness Episode history:  Single Most recent episode:  Today Duration:  2 minutes Progression:  Resolved Chronicity:  New Context: not blood draw   Witnessed: yes   Relieved by:  Nothing Worsened by:  Nothing Ineffective treatments:  None tried Associated symptoms: no anxiety   Risk factors: coronary artery disease   Head Injury     Past Medical History:  Diagnosis Date   Agent orange exposure    Allergies    Arthritis    "neck; left shoulder" (10/25/2016)   CAD (coronary artery disease)    Carotid artery disease (HCC)    CHF (congestive heart failure) (HCC)    Chronic post-traumatic stress disorder (PTSD) 03/16/2015   Chronic sinus infection    CKD (chronic kidney disease), stage III ([place])    Claustrophobia 03/16/2015   Depression    Diabetic neuropathy (HCC)    Former tobacco use    GERD (gastroesophageal reflux disease)    Hyperlipidemia    Hypertension    Morbid obesity (HCC)    OSA (obstructive sleep apnea)    "been checked out several times; each time never got any treatment ordered" (10/25/2016)   PAD (peripheral artery disease) ([place])    a. Diagnostic PV Angio 08/2016 -> s/p RLE PTA in 09/2016. b. s/p LLE PTA in 10/2016.   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[place]) 05/10/2015   Claudication ([place]) 03/18/2015   Coronary artery disease involving native heart 03/18/2015   OSA (obstructive sleep apnea) 03/16/2015   Chronic post-traumatic stress disorder (PTSD) 03/16/2015   Sinus infection     Past Surgical History:  Procedure Laterality Date   ABCESS DRAINAGE     "fluid-filled boils removed off my upper back, left leg; and back of my neck"   ABDOMINAL HERNIA REPAIR  ~ 2008   CATARACT EXTRACTION W/ INTRAOCULAR LENS  IMPLANT, BILATERAL Bilateral    COLONOSCOPY W/ BIOPSIES AND POLYPECTOMY     "was gettin colonoscopy q 5 yrs; 11 polyps found in Dec. 2016; going back in Feb 2018"   CORONARY ANGIOPLASTY  ~ 2004   CYST EXCISION Left 2008   inguinal area   CYST EXCISION N/A 03/23/2021   Procedure: SEBACEOUS CYST REMOVAL FROM  BACK;  Surgeon: Erroll Luna, MD;  Location: [place];  Service: General;  Laterality: N/A;   HERNIA REPAIR     "stomach; it's coming back again" (10/25/2016)   PERIPHERAL VASCULAR CATHETERIZATION N/A 08/27/2016   Procedure: Lower Extremity Angiography;  Surgeon: Lorretta Harp, MD;  Location: [place] [facility];  Service: Cardiovascular;  Laterality: N/A;   PERIPHERAL VASCULAR CATHETERIZATION N/A 09/17/2016   Procedure: Lower Extremity Angiography;  Surgeon: Lorretta Harp, MD;  Location: [place] [facility];  Service: Cardiovascular;  Laterality: N/A;   PERIPHERAL VASCULAR CATHETERIZATION Right 09/17/2016   Procedure: Peripheral Vascular Atherectomy;  Surgeon: Lorretta Harp, MD;  Location: [place] [facility];  Service:  Cardiovascular;  Laterality: Right;  Common Femoral    PERIPHERAL VASCULAR CATHETERIZATION Left 10/25/2016   Procedure: Peripheral Vascular Atherectomy;  Surgeon: Lorretta Harp, MD;  Location: [place] [facility];  Service: Cardiovascular;  Laterality: Left;  SFA       Family History  Problem Relation Age of Onset   Urinary tract infection Mother    Pneumonia Mother    Aneurysm Father    Dementia Sister    Stroke Sister    Sarcoidosis Sister    Healthy Sister     Social History   Tobacco Use   Smoking status: Former    Packs/day: 3.00    Years: 13.00    Pack years: 39.00    Types: Cigarettes    Quit date: 1986    Years since quitting: 36.9   Smokeless tobacco: Never  Substance Use Topics   Alcohol use: Yes    Alcohol/week: 0.0 standard drinks    Comment: 10/25/2016 "quit years ago"   Drug use: No    Home Medications Prior to Admission medications   Medication Sig Start Date End Date Taking? Authorizing Provider  acetaminophen (TYLENOL) 325 MG tablet Take 650 mg by mouth every 6 (six) hours as needed (for pain.).    [provider]  aspirin EC 81 MG tablet Take 81 mg by mouth daily.    [provider]  carvedilol (COREG) 25 MG tablet Take 1 tablet (25 mg total) by mouth 2 (two) times daily with a meal. 09/18/16   Arbutus Leas, NP  cetirizine (ZYRTEC) 10 MG tablet Take 10 mg by mouth daily.    [provider]  chlorthalidone (HYGROTON) 50 MG tablet Take 25 mg by mouth daily.     [provider]  cholecalciferol (VITAMIN D) 1000 UNITS tablet Take 1,000 Units by mouth daily.    [provider]  citalopram (CELEXA) 20 MG tablet Take 10 mg by mouth daily.    [provider]  diclofenac Sodium (VOLTAREN) 1 % GEL Apply 4 g topically 4 (four) times daily. 07/12/20   Darr, Edison Nasuti, PA-C  fluticasone (FLONASE) 50 MCG/ACT nasal spray Place 2 sprays into both nostrils daily. 12/04/19   Raylene Everts, MD  gabapentin  (NEURONTIN) 300 MG capsule Take 300 mg by mouth 2 (two) times daily. 06/17/20   [provider]  guaiFENesin (MUCINEX) 600 MG 12 hr tablet Take 600 mg by mouth daily as needed for cough or to loosen phlegm.    [provider]  HYDROcodone-acetaminophen (NORCO/VICODIN) 5-325 MG tablet Take 1 tablet by mouth every 6 (six) hours as needed for moderate pain. 03/23/21   Cornett, [name], MD  Insulin Glargine-Lixisenatide 100-33 UNT-MCG/ML SOPN Inject 50 Units into the skin daily.     [provider]  ipratropium (ATROVENT) 0.06 % nasal spray as needed. 10/12/20   [provider]  JARDIANCE 25 MG TABS tablet Take 25 mg by mouth daily.  09/15/14   [provider]  linaclotide Rolan Lipa) 145 MCG CAPS capsule as needed.    [provider]  losartan (COZAAR) 100 MG tablet Take 1 tablet (100 mg total) by mouth daily. 09/19/16   Arbutus Leas, NP  pantoprazole (PROTONIX) 40 MG tablet Take 40 mg by mouth daily.    [provider]  polyethylene glycol powder (GLYCOLAX/MIRALAX) 17 GM/SCOOP powder Take 17 g by mouth daily. 07/12/20   Darr, Edison Nasuti, PA-C  rosuvastatin (CRESTOR) 40 MG tablet Take 40 mg by mouth daily at 12 noon.     [provider]  sildenafil (VIAGRA) 100 MG tablet Take 100 mg by mouth as needed. 02/16/15   [provider]  sodium chloride (OCEAN) 0.65 % SOLN nasal spray Place 1 spray into both nostrils 3 (three) times daily as needed for congestion.    [provider]    Allergies    Fluticasone, Atorvastatin, Fosinopril, Pravastatin, Simvastatin, and Terazosin  Review of Systems   Review of Systems  Cardiovascular:  Positive for syncope.  All other systems reviewed and are negative.  Physical Exam Updated Vital Signs BP (!) 160/65    Pulse 73    Temp (!) 97.3 F (36.3 C) (Temporal)    Resp 12    Ht 5\' 8"  (1.727 m)    Wt 110.2 kg    SpO2 98%    BMI 36.95 kg/m   Physical Exam Vitals and nursing note reviewed.   Constitutional:      Appearance: He is well-developed.  HENT:     Head: Normocephalic.     Mouth/Throat:     Mouth: Mucous membranes are moist.  Eyes:     Extraocular Movements: Extraocular movements intact.     Pupils: Pupils are equal, round, and reactive to light.  Cardiovascular:     Rate and Rhythm: Normal rate and regular rhythm.     Pulses: Normal pulses.     Heart sounds: Normal heart sounds.  Pulmonary:     Effort: Pulmonary effort is normal.  Abdominal:     General: Abdomen is flat. There is no distension.     Palpations: Abdomen is soft.  Musculoskeletal:        General: Normal range of motion.     Cervical back: Normal range of motion.  Neurological:     General: No focal deficit present.     Mental Status: He is alert and oriented to person, place, and time.  Psychiatric:        Mood and Affect: Mood normal.    ED Results / Procedures / Treatments   Labs (all labs ordered are listed, but only abnormal results are displayed) Labs Reviewed  CBC WITH DIFFERENTIAL/PLATELET  COMPREHENSIVE METABOLIC PANEL  TROPONIN I (HIGH SENSITIVITY)  TROPONIN I (HIGH SENSITIVITY)    EKG EKG Interpretation  Date/Time:  Wednesday November 01 2021 18:18:19 EST Ventricular Rate:  80 PR Interval:  269 QRS Duration: 85 QT Interval:  367 QTC Calculation: 424 R Axis:     Text Interpretation: EASI Derived Leads Confirmed by Thamas Jaegers ([id number]) on 11/01/2021 7:17:57 PM  Radiology DG Chest Port 1 View  Result Date: 11/01/2021 CLINICAL DATA:  Syncope. EXAM: PORTABLE CHEST 1 VIEW COMPARISON:  August 21, 2016 FINDINGS: Mild areas of atelectasis are seen within the bilateral lung bases. There is no  evidence of a pleural effusion or pneumothorax. The heart size and mediastinal contours are within normal limits. Multilevel degenerative changes seen throughout the thoracic spine. IMPRESSION: Mild bibasilar atelectasis. Electronically Signed   By: Virgina Norfolk M.D.   On:  11/01/2021 19:31    Procedures Procedures   Medications Ordered in ED Medications - No data to display  ED Course  I have reviewed the triage vital signs and the nursing notes.  Pertinent labs & imaging results that were available during my care of the patient were reviewed by me and considered in my medical decision making (see chart for details).    MDM Rules/Calculators/A&P                         MDM:    MDM:  Ekg no acute  chest xray reviewed.  Concern for cardiac event.  I discussed with hospitalist who will admit for further evaltuion Final Clinical Impression(s) / ED Diagnoses Final diagnoses:  Syncope and collapse    Rx / DC Orders ED Discharge Orders     None        Sidney Ace 11/01/21 2341    Luna Fuse, MD 11/09/21 [phone]

## 2021-11-01 NOTE — ED Triage Notes (Signed)
Pt arrived to ED via EMS from [facility] where he was standing in line and had syncopal episode w/ impact to head. Bystander gave pt glucose tabs. Pt is insulin dependent diabetic. CBG w/ EMS was 187. Pt endorses poor oral intake today d/t being busy and not taking any of his medication today d/t being busy. Pt denies any pain or dizziness at this time. No obvious injury noted to head. Pt A&Ox4. Refused IV by EMS. VSS w/ EMS.

## 2021-11-01 NOTE — ED Notes (Signed)
Alvah Lagrow wife [phone] requesting an update

## 2021-11-02 ENCOUNTER — Encounter (HOSPITAL_COMMUNITY): Payer: Self-pay | Admitting: Internal Medicine

## 2021-11-02 ENCOUNTER — Observation Stay (HOSPITAL_COMMUNITY): Payer: No Typology Code available for payment source

## 2021-11-02 DIAGNOSIS — R55 Syncope and collapse: Secondary | ICD-10-CM

## 2021-11-02 LAB — BASIC METABOLIC PANEL
Anion gap: 8 (ref 5–15)
BUN: 16 mg/dL (ref 8–23)
CO2: 25 mmol/L (ref 22–32)
Calcium: 9 mg/dL (ref 8.9–10.3)
Chloride: 98 mmol/L (ref 98–111)
Creatinine, Ser: 1.42 mg/dL — ABNORMAL HIGH (ref 0.61–1.24)
GFR, Estimated: 51 mL/min — ABNORMAL LOW (ref >60)
Glucose, Bld: 225 mg/dL — ABNORMAL HIGH (ref 70–99)
Potassium: 4 mmol/L (ref 3.5–5.1)
Sodium: 131 mmol/L — ABNORMAL LOW (ref 135–145)

## 2021-11-02 LAB — CREATININE, SERUM
Creatinine, Ser: 1.46 mg/dL — ABNORMAL HIGH (ref 0.61–1.24)
GFR, Estimated: 49 mL/min — ABNORMAL LOW (ref >60)

## 2021-11-02 LAB — CBG MONITORING, ED
Glucose-Capillary: 216 mg/dL — ABNORMAL HIGH (ref 70–99)
Glucose-Capillary: 150 mg/dL — ABNORMAL HIGH (ref 70–99)

## 2021-11-02 LAB — CBC
HCT: 39.6 % (ref 39.0–52.0)
HCT: 39 % (ref 39.0–52.0)
Hemoglobin: 12.6 g/dL — ABNORMAL LOW (ref 13.0–17.0)
Hemoglobin: 12.8 g/dL — ABNORMAL LOW (ref 13.0–17.0)
MCH: 29.5 pg (ref 26.0–34.0)
MCH: 29.8 pg (ref 26.0–34.0)
MCHC: 31.8 g/dL (ref 30.0–36.0)
MCHC: 32.8 g/dL (ref 30.0–36.0)
MCV: 92.7 fL (ref 80.0–100.0)
MCV: 90.9 fL (ref 80.0–100.0)
Platelets: 165 10*3/uL (ref 150–400)
Platelets: 177 10*3/uL (ref 150–400)
RBC: 4.27 MIL/uL (ref 4.22–5.81)
RBC: 4.29 MIL/uL (ref 4.22–5.81)
RDW: 13.1 % (ref 11.5–15.5)
RDW: 13 % (ref 11.5–15.5)
WBC: 10.5 10*3/uL (ref 4.0–10.5)
WBC: 9.5 10*3/uL (ref 4.0–10.5)
nRBC: 0 % (ref 0.0–0.2)
nRBC: 0 % (ref 0.0–0.2)

## 2021-11-02 LAB — TROPONIN I (HIGH SENSITIVITY)
Troponin I (High Sensitivity): 10 ng/L (ref <18)
Troponin I (High Sensitivity): 10 ng/L (ref <18)

## 2021-11-02 LAB — ECHOCARDIOGRAM COMPLETE
Area-P 1/2: 5.31 cm2
Height: 68 in
S' Lateral: 2 cm
Weight: 3888 oz

## 2021-11-02 LAB — HEMOGLOBIN A1C
Hgb A1c MFr Bld: 7.6 % — ABNORMAL HIGH (ref 4.8–5.6)
Mean Plasma Glucose: 171.42 mg/dL

## 2021-11-02 LAB — GLUCOSE, CAPILLARY
Glucose-Capillary: 195 mg/dL — ABNORMAL HIGH (ref 70–99)
Glucose-Capillary: 193 mg/dL — ABNORMAL HIGH (ref 70–99)

## 2021-11-02 LAB — RESP PANEL BY RT-PCR (FLU A&B, COVID) ARPGX2
Influenza A by PCR: NEGATIVE
Influenza B by PCR: NEGATIVE
SARS Coronavirus 2 by RT PCR: NEGATIVE

## 2021-11-02 MED ORDER — PERFLUTREN LIPID MICROSPHERE
1.0000 mL | INTRAVENOUS | Status: AC | PRN
Start: 2021-11-02 — End: 2021-11-02
  Administered 2021-11-02: 11:00:00 2 mL via INTRAVENOUS
  Filled 2021-11-02: qty 10

## 2021-11-02 MED ORDER — CARVEDILOL 25 MG PO TABS
25.0000 mg | ORAL_TABLET | Freq: Two times a day (BID) | ORAL | Status: DC
  Administered 2021-11-02 – 2021-11-04 (×5): 25 mg via ORAL
  Filled 2021-11-02: qty 2
  Filled 2021-11-02 (×4): qty 1

## 2021-11-02 MED ORDER — ASPIRIN EC 81 MG PO TBEC
81.0000 mg | DELAYED_RELEASE_TABLET | Freq: Every day | ORAL | Status: DC
  Administered 2021-11-02 – 2021-11-04 (×3): 81 mg via ORAL
  Filled 2021-11-02 (×3): qty 1

## 2021-11-02 MED ORDER — INSULIN GLARGINE-LIXISENATIDE 100-33 UNT-MCG/ML ~~LOC~~ SOPN: 50.0000 [IU] | PEN_INJECTOR | Freq: Every day | SUBCUTANEOUS | Status: DC

## 2021-11-02 MED ORDER — ACETAMINOPHEN 650 MG RE SUPP: 650.0000 mg | Freq: Four times a day (QID) | RECTAL | Status: DC | PRN

## 2021-11-02 MED ORDER — INSULIN ASPART 100 UNIT/ML IJ SOLN
0.0000 [IU] | Freq: Three times a day (TID) | INTRAMUSCULAR | Status: DC
Start: 2021-11-02 — End: 2021-11-04
  Administered 2021-11-02: 13:00:00 1 [IU] via SUBCUTANEOUS
  Administered 2021-11-02: 18:00:00 2 [IU] via SUBCUTANEOUS
  Administered 2021-11-02 – 2021-11-03 (×2): 3 [IU] via SUBCUTANEOUS
  Administered 2021-11-03: 09:00:00 1 [IU] via SUBCUTANEOUS
  Administered 2021-11-03: 12:00:00 3 [IU] via SUBCUTANEOUS
  Administered 2021-11-04: 09:00:00 2 [IU] via SUBCUTANEOUS

## 2021-11-02 MED ORDER — ACETAMINOPHEN 325 MG PO TABS: 650.0000 mg | ORAL_TABLET | Freq: Four times a day (QID) | ORAL | Status: DC | PRN

## 2021-11-02 MED ORDER — LOSARTAN POTASSIUM 50 MG PO TABS
100.0000 mg | ORAL_TABLET | Freq: Every day | ORAL | Status: DC
  Administered 2021-11-02 – 2021-11-03 (×2): 100 mg via ORAL
  Filled 2021-11-02 (×2): qty 2

## 2021-11-02 MED ORDER — GABAPENTIN 300 MG PO CAPS
300.0000 mg | ORAL_CAPSULE | Freq: Two times a day (BID) | ORAL | Status: DC
  Administered 2021-11-02 – 2021-11-04 (×6): 300 mg via ORAL
  Filled 2021-11-02 (×6): qty 1

## 2021-11-02 MED ORDER — INSULIN GLARGINE-YFGN 100 UNIT/ML ~~LOC~~ SOLN
25.0000 [IU] | Freq: Every day | SUBCUTANEOUS | Status: DC
  Administered 2021-11-02 – 2021-11-03 (×2): 25 [IU] via SUBCUTANEOUS
  Filled 2021-11-02 (×3): qty 0.25

## 2021-11-02 MED ORDER — ENOXAPARIN SODIUM 40 MG/0.4ML IJ SOSY
40.0000 mg | PREFILLED_SYRINGE | INTRAMUSCULAR | Status: DC
  Administered 2021-11-02 – 2021-11-03 (×2): 40 mg via SUBCUTANEOUS
  Filled 2021-11-02 (×2): qty 0.4

## 2021-11-02 MED ORDER — PANTOPRAZOLE SODIUM 40 MG PO TBEC
40.0000 mg | DELAYED_RELEASE_TABLET | Freq: Every day | ORAL | Status: DC
  Administered 2021-11-02 – 2021-11-04 (×3): 40 mg via ORAL
  Filled 2021-11-02 (×3): qty 1

## 2021-11-02 MED ORDER — CHLORTHALIDONE 25 MG PO TABS
25.0000 mg | ORAL_TABLET | Freq: Every day | ORAL | Status: DC
  Administered 2021-11-02 – 2021-11-03 (×2): 25 mg via ORAL
  Filled 2021-11-02 (×2): qty 1

## 2021-11-02 NOTE — ED Notes (Signed)
Echo at bedside

## 2021-11-02 NOTE — Progress Notes (Signed)
Inpatient Diabetes Program Recommendations  AACE/ADA: New Consensus Statement on Inpatient Glycemic Control (2015)  Target Ranges:  Prepandial:   less than 140 mg/dL      Peak postprandial:   less than 180 mg/dL (1-2 hours)      Critically ill patients:  140 - 180 mg/dL   Lab Results  Component Value Date   GLUCAP 150 (H) 11/02/2021   HGBA1C 7.6 (H) 11/02/2021    Review of Glycemic Control  Diabetes history: DM2 Outpatient Diabetes medications: Glargine/Lixisenatide 50 units qd, Jardiance 25 mg qd Current orders for Inpatient glycemic control: Glargine/Lixisenatide 50 units qd, Novolog 0-9 units tid  Inpatient Diabetes Program Recommendations:   Please consider: -D/C current Glargine/Lixisenatide (not on formulary) -Add Semglee 25 units qd (50% home basal insulin) Secure chat sent to Dr. Darrick Meigs.  Thank you, Nani Gasser. Kamill Fulbright, RN, MSN, CDE  Diabetes Coordinator Inpatient Glycemic Control Team Team Pager [phone] (8am-5pm) 11/02/2021 12:38 PM

## 2021-11-02 NOTE — Progress Notes (Signed)
Subjective: Patient admitted this morning, see detailed H&P by Dr Hal Hope 75 year old male with history of CAD, hypertension, peripheral vascular disease, hyperlipidemia, diabetes mellitus type 2, CKD stage II, anemia, sleep apnea was brought to the ED after he had episode of loss of consciousness.  [place].  He had brief loss of consciousness and regained consciousness immediately.  Prior to losing consciousness he felt dizzy.  Did not have chest pain or shortness of breath.  Patient had similar episode several months ago.  At that time he did not seek medical care.  In the ED EKG showed normal sinus rhythm with QTC of 425 ms.  Troponin was elevated 26, 10.  COVID test negative.  Patient placed under observation for evaluation for syncope.  Vitals:   11/02/21 0530 11/02/21 0540  BP: (!) 147/65   Pulse: 81   Resp: (!) 30 19  Temp:    SpO2: 95%       A/P Syncope -Unclear etiology -Patient is on multiple antihypertensive medication including diuretics chlorthalidone, Cozaar, Coreg at home -?  Orthostatic hypotension -Will give all the home medications for hypertension and then check orthostatic vital signs -Continue monitoring on telemetry -Cardiac enzymes are negative -Last echocardiogram from 2020 was normal, we will check echocardiogram -Based on above results he might need cardiology evaluation and Holter monitor as outpatient   [place] Hospitalist Pager- [phone]

## 2021-11-02 NOTE — H&P (Signed)
History and Physical    Joel Rivera. WLS:[id number] DOB: [birth year] DOA: 11/01/2021  PCP: Jani Gravel, MD  Patient coming from: Home.  Chief Complaint: Loss of consciousness.  HPI: Joel Rivera. is a 75 y.o. male with history of CAD, hypertension, peripheral vascular disease, hyperlipidemia, diabetes mellitus, chronic and disease stage II, anemia, sleep apnea was brought to the ER after patient had loss of consciousness.  Patient was at shopping center when the incident happened.  Patient states that he briefly lost consciousness and regained immediately.  Prior to losing consciousness patient briefly felt dizzy.  Did not have any chest pain palpitation shortness of breath.  About 7 months ago patient had a similar episode.  At that time he did not seek medical care.  Patient states that he did not take any of his medications today and also did not eat anything prior to going for the shopping.  ED Course: In the ER patient appears nonfocal.  EKG shows normal sinus rhythm with QTC of 424 ms.  Labs are largely at baseline.  High sensitive troponin was 26 and 10.  WBC count was 11.4.  COVID test negative.  Given the cardiac history patient admitted for further observation.  Review of Systems: As per HPI, rest all negative.   Past Medical History:  Diagnosis Date   Agent orange exposure    Allergies    Arthritis    "neck; left shoulder" (10/25/2016)   CAD (coronary artery disease)    Carotid artery disease (HCC)    CHF (congestive heart failure) (HCC)    Chronic post-traumatic stress disorder (PTSD) 03/16/2015   Chronic sinus infection    CKD (chronic kidney disease), stage III ([place])    Claustrophobia 03/16/2015   Depression    Diabetic neuropathy (HCC)    Former tobacco use    GERD (gastroesophageal reflux disease)    Hyperlipidemia    Hypertension    Morbid obesity (HCC)    OSA (obstructive sleep apnea)    "been checked out several times; each time never got any treatment  ordered" (10/25/2016)   PAD (peripheral artery disease) ([place])    a. Diagnostic PV Angio 08/2016 -> s/p RLE PTA in 09/2016. b. s/p LLE PTA in 10/2016.   PTSD (post-traumatic stress disorder)    "I'm a Veteran; 100% disability"   Recurrent boils    abceses   Type II diabetes mellitus ([place])     Past Surgical History:  Procedure Laterality Date   ABCESS DRAINAGE     "fluid-filled boils removed off my upper back, left leg; and back of my neck"   ABDOMINAL HERNIA REPAIR  ~ 2008   CATARACT EXTRACTION W/ INTRAOCULAR LENS  IMPLANT, BILATERAL Bilateral    COLONOSCOPY W/ BIOPSIES AND POLYPECTOMY     "was gettin colonoscopy q 5 yrs; 11 polyps found in Dec. 2016; going back in Feb 2018"   CORONARY ANGIOPLASTY  ~ 2004   CYST EXCISION Left 2008   inguinal area   CYST EXCISION N/A 03/23/2021   Procedure: SEBACEOUS CYST REMOVAL FROM BACK;  Surgeon: Erroll Luna, MD;  Location: [facility];  Service: General;  Laterality: N/A;   HERNIA REPAIR     "stomach; it's coming back again" (10/25/2016)   PERIPHERAL VASCULAR CATHETERIZATION N/A 08/27/2016   Procedure: Lower Extremity Angiography;  Surgeon: Lorretta Harp, MD;  Location: [facility];  Service: Cardiovascular;  Laterality: N/A;   PERIPHERAL VASCULAR CATHETERIZATION N/A 09/17/2016   Procedure: Lower Extremity  Angiography;  Surgeon: Lorretta Harp, MD;  Location: [facility];  Service: Cardiovascular;  Laterality: N/A;   PERIPHERAL VASCULAR CATHETERIZATION Right 09/17/2016   Procedure: Peripheral Vascular Atherectomy;  Surgeon: Lorretta Harp, MD;  Location: [facility];  Service: Cardiovascular;  Laterality: Right;  Common Femoral    PERIPHERAL VASCULAR CATHETERIZATION Left 10/25/2016   Procedure: Peripheral Vascular Atherectomy;  Surgeon: Lorretta Harp, MD;  Location: [facility];  Service: Cardiovascular;  Laterality: Left;  SFA     reports that he quit smoking about 36 years ago. His smoking  use included cigarettes. He has a 39.00 pack-year smoking history. He has never used smokeless tobacco. He reports current alcohol use. He reports that he does not use drugs.  Allergies  Allergen Reactions   Fluticasone    Atorvastatin Other (See Comments)    Muscle pain/weaknesses (Myalgia)   Fosinopril Other (See Comments)   Pravastatin Other (See Comments)    Muscle pain/weaknesses (Myalgia)   Simvastatin Other (See Comments)    Muscle pain/weaknesses (Myalgia)   Terazosin Other (See Comments)    Unknown reaction.    Family History  Problem Relation Age of Onset   Urinary tract infection Mother    Pneumonia Mother    Aneurysm Father    Dementia Sister    Stroke Sister    Sarcoidosis Sister    Healthy Sister     Prior to Admission medications   Medication Sig Start Date End Date Taking? Authorizing Provider  acetaminophen (TYLENOL) 325 MG tablet Take 650 mg by mouth every 6 (six) hours as needed (for pain.).   Yes [provider]  aspirin EC 81 MG tablet Take 81 mg by mouth daily.   Yes [provider]  carvedilol (COREG) 25 MG tablet Take 1 tablet (25 mg total) by mouth 2 (two) times daily with a meal. 09/18/16  Yes Arbutus Leas, NP  cetirizine (ZYRTEC) 10 MG tablet Take 10 mg by mouth daily.   Yes [provider]  chlorthalidone (HYGROTON) 50 MG tablet Take 25 mg by mouth daily.    Yes [provider]  cholecalciferol (VITAMIN D) 1000 UNITS tablet Take 1,000 Units by mouth daily.   Yes [provider]  citalopram (CELEXA) 20 MG tablet Take 10 mg by mouth daily.   Yes [provider]  gabapentin (NEURONTIN) 300 MG capsule Take 300 mg by mouth 2 (two) times daily. 06/17/20  Yes [provider]  guaiFENesin (MUCINEX) 600 MG 12 hr tablet Take 600 mg by mouth daily as needed for cough or to loosen phlegm.   Yes [provider]  HYDROcodone-acetaminophen (NORCO/VICODIN) 5-325 MG tablet Take 1 tablet by mouth  every 6 (six) hours as needed for moderate pain. 03/23/21  Yes Cornett, [name], MD  Insulin Glargine-Lixisenatide 100-33 UNT-MCG/ML SOPN Inject 50 Units into the skin daily.    Yes [provider]  ipratropium (ATROVENT) 0.06 % nasal spray as needed. 10/12/20  Yes [provider]  JARDIANCE 25 MG TABS tablet Take 25 mg by mouth daily.  09/15/14  Yes [provider]  linaclotide (LINZESS) 145 MCG CAPS capsule as needed.   Yes [provider]  losartan (COZAAR) 100 MG tablet Take 1 tablet (100 mg total) by mouth daily. 09/19/16  Yes Arbutus Leas, NP  pantoprazole (PROTONIX) 40 MG tablet Take 40 mg by mouth daily.   Yes [provider]  polyethylene glycol powder (GLYCOLAX/MIRALAX) 17 GM/SCOOP powder Take 17  g by mouth daily. 07/12/20  Yes Darr, Edison Nasuti, PA-C  rosuvastatin (CRESTOR) 40 MG tablet Take 40 mg by mouth daily at 12 noon.    Yes [provider]  sildenafil (VIAGRA) 100 MG tablet Take 100 mg by mouth as needed for erectile dysfunction. 02/16/15  Yes [provider]  sodium chloride (OCEAN) 0.65 % SOLN nasal spray Place 1 spray into both nostrils 3 (three) times daily as needed for congestion.   Yes [provider]  diclofenac Sodium (VOLTAREN) 1 % GEL Apply 4 g topically 4 (four) times daily. 07/12/20   Darr, Edison Nasuti, PA-C  fluticasone (FLONASE) 50 MCG/ACT nasal spray Place 2 sprays into both nostrils daily. 12/04/19   Raylene Everts, MD    Physical Exam: Constitutional: Moderately built and nourished. Vitals:   11/01/21 2030 11/01/21 2041 11/01/21 2130 11/01/21 2312  BP:  (!) 160/65 (!) 159/63 (!) 149/74  Pulse: 73 73 77 80  Resp: 17 12 19 17   Temp:      TempSrc:      SpO2: 97% 98% 98% 97%  Weight:      Height:       Eyes: Anicteric no pallor. ENMT: No discharge from the ears eyes nose and mouth. Neck: No mass felt.  No neck rigidity. Respiratory: No rhonchi or crepitations. Cardiovascular: S1-S2 heard. Abdomen:  Soft nontender bowel sound present. Musculoskeletal: No edema. Skin: No rash. Neurologic: Alert awake oriented time place and person.  Moves all extremities. Psychiatric: Appears normal.  Normal affect.   Labs on Admission: I have personally reviewed following labs and imaging studies  CBC: Recent Labs  Lab 11/01/21 1855  WBC 11.4*  NEUTROABS 8.0*  HGB 12.9*  HCT 39.5  MCV 91.2  PLT 440   Basic Metabolic Panel: Recent Labs  Lab 11/01/21 1855  NA 133*  K 3.9  CL 99  CO2 25  GLUCOSE 118*  BUN 18  CREATININE 1.48*  CALCIUM 9.1   GFR: Estimated Creatinine Clearance: 50.3 mL/min (A) (by C-G formula based on SCr of 1.48 mg/dL (H)). Liver Function Tests: Recent Labs  Lab 11/01/21 1855  AST 19  ALT 15  ALKPHOS 89  BILITOT 1.3*  PROT 8.2*  ALBUMIN 3.2*   No results for input(s): LIPASE, AMYLASE in the last 168 hours. No results for input(s): AMMONIA in the last 168 hours. Coagulation Profile: No results for input(s): INR, PROTIME in the last 168 hours. Cardiac Enzymes: No results for input(s): CKTOTAL, CKMB, CKMBINDEX, TROPONINI in the last 168 hours. BNP (last 3 results) No results for input(s): PROBNP in the last 8760 hours. HbA1C: No results for input(s): HGBA1C in the last 72 hours. CBG: No results for input(s): GLUCAP in the last 168 hours. Lipid Profile: No results for input(s): CHOL, HDL, LDLCALC, TRIG, CHOLHDL, LDLDIRECT in the last 72 hours. Thyroid Function Tests: No results for input(s): TSH, T4TOTAL, FREET4, T3FREE, THYROIDAB in the last 72 hours. Anemia Panel: No results for input(s): VITAMINB12, FOLATE, FERRITIN, TIBC, IRON, RETICCTPCT in the last 72 hours. Urine analysis:    Component Value Date/Time   COLORURINE YELLOW 09/17/2016 2154   APPEARANCEUR CLEAR 09/17/2016 2154   LABSPEC 1.010 09/17/2016 2154   PHURINE 6.0 09/17/2016 2154   GLUCOSEU >1000 (A) 09/17/2016 2154   HGBUR LARGE (A) 09/17/2016 2154   BILIRUBINUR NEGATIVE 09/17/2016  2154   KETONESUR 40 (A) 09/17/2016 2154   PROTEINUR NEGATIVE 09/17/2016 2154   NITRITE NEGATIVE 09/17/2016 2154   LEUKOCYTESUR NEGATIVE 09/17/2016 2154   Sepsis  Labs: @LABRCNTIP (procalcitonin:4,lacticidven:4) ) Recent Results (from the past 240 hour(s))  Resp Panel by RT-PCR (Flu A&B, Covid) Nasopharyngeal Swab     Status: None   Collection Time: 11/01/21 10:34 PM   Specimen: Nasopharyngeal Swab; Nasopharyngeal(NP) swabs in vial transport medium  Result Value Ref Range Status   SARS Coronavirus 2 by RT PCR NEGATIVE NEGATIVE Final    Comment: (NOTE) SARS-CoV-2 target nucleic acids are NOT DETECTED.  The SARS-CoV-2 RNA is generally detectable in upper respiratory specimens during the acute phase of infection. The lowest concentration of SARS-CoV-2 viral copies this assay can detect is 138 copies/mL. A negative result does not preclude SARS-Cov-2 infection and should not be used as the sole basis for treatment or other patient management decisions. A negative result may occur with  improper specimen collection/handling, submission of specimen other than nasopharyngeal swab, presence of viral mutation(s) within the areas targeted by this assay, and inadequate number of viral copies(<138 copies/mL). A negative result must be combined with clinical observations, patient history, and epidemiological information. The expected result is Negative.  Fact Sheet for Patients:  EntrepreneurPulse.com.au  Fact Sheet for Healthcare Providers:  IncredibleEmployment.be  This test is no t yet approved or cleared by the [place] FDA and  has been authorized for detection and/or diagnosis of SARS-CoV-2 by FDA under an Emergency Use Authorization (EUA). This EUA will remain  in effect (meaning this test can be used) for the duration of the COVID-19 declaration under Section 564(b)(1) of the Act, 21 U.S.C.section 360bbb-3(b)(1), unless the authorization is  terminated  or revoked sooner.       Influenza A by PCR NEGATIVE NEGATIVE Final   Influenza B by PCR NEGATIVE NEGATIVE Final    Comment: (NOTE) The Xpert Xpress SARS-CoV-2/FLU/RSV plus assay is intended as an aid in the diagnosis of influenza from Nasopharyngeal swab specimens and should not be used as a sole basis for treatment. Nasal washings and aspirates are unacceptable for Xpert Xpress SARS-CoV-2/FLU/RSV testing.  Fact Sheet for Patients: EntrepreneurPulse.com.au  Fact Sheet for Healthcare Providers: IncredibleEmployment.be  This test is not yet approved or cleared by the [place] FDA and has been authorized for detection and/or diagnosis of SARS-CoV-2 by FDA under an Emergency Use Authorization (EUA). This EUA will remain in effect (meaning this test can be used) for the duration of the COVID-19 declaration under Section 564(b)(1) of the Act, 21 U.S.C. section 360bbb-3(b)(1), unless the authorization is terminated or revoked.  Performed at [facility], [place] [address] [place], [place] [address]      Radiological Exams on Admission: DG Chest Port 1 View  Result Date: 11/01/2021 CLINICAL DATA:  Syncope. EXAM: PORTABLE CHEST 1 VIEW COMPARISON:  August 21, 2016 FINDINGS: Mild areas of atelectasis are seen within the bilateral lung bases. There is no evidence of a pleural effusion or pneumothorax. The heart size and mediastinal contours are within normal limits. Multilevel degenerative changes seen throughout the thoracic spine. IMPRESSION: Mild bibasilar atelectasis. Electronically Signed   By: Virgina Norfolk M.D.   On: 11/01/2021 19:31    EKG: Independently reviewed.  Normal sinus rhythm with QTC of 424 ms.  Assessment/Plan Principal Problem:   Syncope Active Problems:   OSA (obstructive sleep apnea)   Coronary artery disease involving native heart   PAD (peripheral artery disease) (HCC)   CKD (chronic kidney  disease), stage III (HCC)   Hypertension    Syncope -cause not clear.  Patient did feel mildly dizzy prior to the incident.  Will check orthostatics.  Given her cardiac history we will closely monitor in telemetry.  Eventually may need follow-up with cardiology for event monitor. Diabetes mellitus type 2 -takes long-acting insulin 50 units in the morning.  Follow CBGs closely.  Check hemoglobin A1c. Hypertension on hydrochlorothiazide Cozaar and carvedilol. History of CAD denies any chest pain.  On statins beta-blockers and aspirin. Peripheral vascular disease on statins and antiplatelet agents. Chronic and disease stage III creatinine appears to be at baseline. Sleep apnea on CPAP at bedtime. Chronic anemia follow CBC.   DVT prophylaxis: Lovenox. Code Status: Full code. Family Communication: Patient's son. Disposition Plan: Home. Consults called: None. Admission status: Observation.   Rise Patience MD [facility] Pager [phone].  If 7PM-7AM, please contact night-coverage [email or web address] Password [email or web address]  11/02/2021, 1:51 AM

## 2021-11-02 NOTE — ED Notes (Signed)
Joel Rivera wife [phone] requesting an update on the patient

## 2021-11-02 NOTE — ED Notes (Signed)
Spoke to [employer]. Informed pt is able to be transported to IP room

## 2021-11-03 DIAGNOSIS — Z79899 Other long term (current) drug therapy: Secondary | ICD-10-CM | POA: Diagnosis not present

## 2021-11-03 DIAGNOSIS — E871 Hypo-osmolality and hyponatremia: Secondary | ICD-10-CM | POA: Diagnosis present

## 2021-11-03 DIAGNOSIS — E1151 Type 2 diabetes mellitus with diabetic peripheral angiopathy without gangrene: Secondary | ICD-10-CM | POA: Diagnosis present

## 2021-11-03 DIAGNOSIS — I10 Essential (primary) hypertension: Secondary | ICD-10-CM | POA: Diagnosis not present

## 2021-11-03 DIAGNOSIS — Z961 Presence of intraocular lens: Secondary | ICD-10-CM | POA: Diagnosis present

## 2021-11-03 DIAGNOSIS — K219 Gastro-esophageal reflux disease without esophagitis: Secondary | ICD-10-CM | POA: Diagnosis present

## 2021-11-03 DIAGNOSIS — Z20822 Contact with and (suspected) exposure to covid-19: Secondary | ICD-10-CM | POA: Diagnosis present

## 2021-11-03 DIAGNOSIS — I951 Orthostatic hypotension: Principal | ICD-10-CM

## 2021-11-03 DIAGNOSIS — N1831 Chronic kidney disease, stage 3a: Secondary | ICD-10-CM

## 2021-11-03 DIAGNOSIS — I5032 Chronic diastolic (congestive) heart failure: Secondary | ICD-10-CM | POA: Diagnosis present

## 2021-11-03 DIAGNOSIS — I2510 Atherosclerotic heart disease of native coronary artery without angina pectoris: Secondary | ICD-10-CM | POA: Diagnosis present

## 2021-11-03 DIAGNOSIS — F4312 Post-traumatic stress disorder, chronic: Secondary | ICD-10-CM | POA: Diagnosis present

## 2021-11-03 DIAGNOSIS — R55 Syncope and collapse: Secondary | ICD-10-CM

## 2021-11-03 DIAGNOSIS — Z87891 Personal history of nicotine dependence: Secondary | ICD-10-CM | POA: Diagnosis not present

## 2021-11-03 DIAGNOSIS — E1122 Type 2 diabetes mellitus with diabetic chronic kidney disease: Secondary | ICD-10-CM | POA: Diagnosis present

## 2021-11-03 DIAGNOSIS — E860 Dehydration: Secondary | ICD-10-CM | POA: Diagnosis present

## 2021-11-03 DIAGNOSIS — G4733 Obstructive sleep apnea (adult) (pediatric): Secondary | ICD-10-CM | POA: Diagnosis present

## 2021-11-03 DIAGNOSIS — Z6836 Body mass index (BMI) 36.0-36.9, adult: Secondary | ICD-10-CM | POA: Diagnosis not present

## 2021-11-03 DIAGNOSIS — I440 Atrioventricular block, first degree: Secondary | ICD-10-CM | POA: Diagnosis present

## 2021-11-03 DIAGNOSIS — Z794 Long term (current) use of insulin: Secondary | ICD-10-CM | POA: Diagnosis not present

## 2021-11-03 DIAGNOSIS — N179 Acute kidney failure, unspecified: Secondary | ICD-10-CM | POA: Diagnosis present

## 2021-11-03 DIAGNOSIS — D631 Anemia in chronic kidney disease: Secondary | ICD-10-CM | POA: Diagnosis present

## 2021-11-03 DIAGNOSIS — E785 Hyperlipidemia, unspecified: Secondary | ICD-10-CM | POA: Diagnosis present

## 2021-11-03 DIAGNOSIS — I130 Hypertensive heart and chronic kidney disease with heart failure and stage 1 through stage 4 chronic kidney disease, or unspecified chronic kidney disease: Secondary | ICD-10-CM | POA: Diagnosis present

## 2021-11-03 DIAGNOSIS — E1140 Type 2 diabetes mellitus with diabetic neuropathy, unspecified: Secondary | ICD-10-CM | POA: Diagnosis present

## 2021-11-03 LAB — GLUCOSE, CAPILLARY
Glucose-Capillary: 143 mg/dL — ABNORMAL HIGH (ref 70–99)
Glucose-Capillary: 202 mg/dL — ABNORMAL HIGH (ref 70–99)
Glucose-Capillary: 232 mg/dL — ABNORMAL HIGH (ref 70–99)
Glucose-Capillary: 162 mg/dL — ABNORMAL HIGH (ref 70–99)

## 2021-11-03 MED ORDER — LOSARTAN POTASSIUM 50 MG PO TABS
50.0000 mg | ORAL_TABLET | Freq: Every day | ORAL | Status: DC
  Administered 2021-11-04: 09:00:00 50 mg via ORAL
  Filled 2021-11-03: qty 1

## 2021-11-03 MED ORDER — SODIUM CHLORIDE 0.9 % IV SOLN: INTRAVENOUS | Status: DC

## 2021-11-03 NOTE — Care Management (Signed)
[phone] 11-03-21 Case Manager the [facility] centralized Number to make them aware of hospitalization. Office had a thirty minute wait. Secure voicemail was left with arrival time and additional information. Patient states he is a member of the [facility] and is 100% service connected. Patient gets all medications from the [facility]. Weekend Case Manager will assist with any additional information.

## 2021-11-03 NOTE — Progress Notes (Signed)
Pt refused cpap

## 2021-11-03 NOTE — Consult Note (Addendum)
Cardiology Consultation:   Patient ID: Joel Henri. MRN: [id number]; DOB: 03/12/1946  Admit date: 11/01/2021 Date of Consult: 11/03/2021  PCP:  Jani Gravel, MD   [facility] Providers Cardiologist:  Sinclair Grooms, MD     Patient Profile:   Joel Fecher. is a 75 y.o. male with a hx of remote nonobs CAD, HTN with severe LVH, HFpEF, HLD, OSA on Cpap, claudication/PVD and DM who is being seen 11/03/2021 for the evaluation of syncope at the request of Dr. Darrick Meigs.  History of Present Illness:   Joel Rivera is a 75 yo male with PMH noted above. He has been followed by Dr. Gwenlyn Found and Dr. Tamala Julian as an outpatient. He has undergone PV angiogram back 2017 with right and left staged SFA intervention with Dr. Gwenlyn Found and done well since that time. Reports compliance with his home medications. States over the past several years he has lost significant amount of weight from 289>>>231lbs. Has noted recently when he takes his blood pressures in the evenings the reading had been lower in the 25E systolic.   Presented to the ED on 12/22 after an episode of syncope. Reports he was at [facility] shopping. Had not eaten much that afternoon, other than an orange. While standing in line he began to feel dizzy and light-headed. Had a witnessed syncopal episode by bystanders. States when he came to he felt normal, no complaints but was brought in for further evaluation.   In the ED his labs showed Na+ 133, K+ 3.9, Cr 1.48, hsTn 26>>10>>10, WBC 11.4, Hgb 12.9, respiratory panel negative. CXR with mild bibasilar atelectasis. EKG showed SR 80bpm with q waves in inferior leads, similar to prior tracing. Admitted to internal medicine for further evaluation.   Echo 12/22 showed LVEF of >75%, no rWMA, mild LVH  He was not orthostatic on admission, but recheck today was positive.   Past Medical History:  Diagnosis Date   Agent orange exposure    Allergies    Arthritis    "neck; left shoulder" (10/25/2016)   CAD  (coronary artery disease)    Carotid artery disease (HCC)    CHF (congestive heart failure) (HCC)    Chronic post-traumatic stress disorder (PTSD) 03/16/2015   Chronic sinus infection    CKD (chronic kidney disease), stage III ([place])    Claustrophobia 03/16/2015   Depression    Diabetic neuropathy (HCC)    Former tobacco use    GERD (gastroesophageal reflux disease)    Hyperlipidemia    Hypertension    Morbid obesity (HCC)    OSA (obstructive sleep apnea)    "been checked out several times; each time never got any treatment ordered" (10/25/2016)   PAD (peripheral artery disease) ([place])    a. Diagnostic PV Angio 08/2016 -> s/p RLE PTA in 09/2016. b. s/p LLE PTA in 10/2016.   PTSD (post-traumatic stress disorder)    "I'm a Veteran; 100% disability"   Recurrent boils    abceses   Type II diabetes mellitus ([place])     Past Surgical History:  Procedure Laterality Date   ABCESS DRAINAGE     "fluid-filled boils removed off my upper back, left leg; and back of my neck"   ABDOMINAL HERNIA REPAIR  ~ 2008   CATARACT EXTRACTION W/ INTRAOCULAR LENS  IMPLANT, BILATERAL Bilateral    COLONOSCOPY W/ BIOPSIES AND POLYPECTOMY     "was gettin colonoscopy q 5 yrs; 11 polyps found in Dec. 2016; going back in Feb 2018"  CORONARY ANGIOPLASTY  ~ 2004   CYST EXCISION Left 2008   inguinal area   CYST EXCISION N/A 03/23/2021   Procedure: SEBACEOUS CYST REMOVAL FROM BACK;  Surgeon: Erroll Luna, MD;  Location: [facility];  Service: General;  Laterality: N/A;   HERNIA REPAIR     "stomach; it's coming back again" (10/25/2016)   PERIPHERAL VASCULAR CATHETERIZATION N/A 08/27/2016   Procedure: Lower Extremity Angiography;  Surgeon: Lorretta Harp, MD;  Location: [place] [facility];  Service: Cardiovascular;  Laterality: N/A;   PERIPHERAL VASCULAR CATHETERIZATION N/A 09/17/2016   Procedure: Lower Extremity Angiography;  Surgeon: Lorretta Harp, MD;  Location: [place] [facility];  Service:  Cardiovascular;  Laterality: N/A;   PERIPHERAL VASCULAR CATHETERIZATION Right 09/17/2016   Procedure: Peripheral Vascular Atherectomy;  Surgeon: Lorretta Harp, MD;  Location: [place] [facility];  Service: Cardiovascular;  Laterality: Right;  Common Femoral    PERIPHERAL VASCULAR CATHETERIZATION Left 10/25/2016   Procedure: Peripheral Vascular Atherectomy;  Surgeon: Lorretta Harp, MD;  Location: [place] [facility];  Service: Cardiovascular;  Laterality: Left;  SFA     Home Medications:  Prior to Admission medications   Medication Sig Start Date End Date Taking? Authorizing Provider  acetaminophen (TYLENOL) 325 MG tablet Take 650 mg by mouth every 6 (six) hours as needed (for pain.).   Yes [provider]  aspirin EC 81 MG tablet Take 81 mg by mouth daily.   Yes [provider]  carvedilol (COREG) 25 MG tablet Take 1 tablet (25 mg total) by mouth 2 (two) times daily with a meal. 09/18/16  Yes Arbutus Leas, NP  cetirizine (ZYRTEC) 10 MG tablet Take 10 mg by mouth daily.   Yes [provider]  chlorthalidone (HYGROTON) 50 MG tablet Take 25 mg by mouth daily.    Yes [provider]  cholecalciferol (VITAMIN D) 1000 UNITS tablet Take 1,000 Units by mouth daily.   Yes [provider]  citalopram (CELEXA) 20 MG tablet Take 10 mg by mouth daily.   Yes [provider]  gabapentin (NEURONTIN) 300 MG capsule Take 300 mg by mouth 2 (two) times daily. 06/17/20  Yes [provider]  guaiFENesin (MUCINEX) 600 MG 12 hr tablet Take 600 mg by mouth daily as needed for cough or to loosen phlegm.   Yes [provider]  HYDROcodone-acetaminophen (NORCO/VICODIN) 5-325 MG tablet Take 1 tablet by mouth every 6 (six) hours as needed for moderate pain. 03/23/21  Yes Cornett, [name], MD  Insulin Glargine-Lixisenatide 100-33 UNT-MCG/ML SOPN Inject 50 Units into the skin daily.    Yes [provider]  ipratropium (ATROVENT) 0.06 % nasal spray  as needed. 10/12/20  Yes [provider]  JARDIANCE 25 MG TABS tablet Take 25 mg by mouth daily.  09/15/14  Yes [provider]  linaclotide (LINZESS) 145 MCG CAPS capsule as needed.   Yes [provider]  losartan (COZAAR) 100 MG tablet Take 1 tablet (100 mg total) by mouth daily. 09/19/16  Yes Arbutus Leas, NP  pantoprazole (PROTONIX) 40 MG tablet Take 40 mg by mouth daily.   Yes [provider]  polyethylene glycol powder (GLYCOLAX/MIRALAX) 17 GM/SCOOP powder Take 17 g by mouth daily. 07/12/20  Yes Darr, Edison Nasuti, PA-C  rosuvastatin (CRESTOR) 40 MG tablet Take 40 mg by mouth daily at 12 noon.    Yes [provider]  sildenafil (VIAGRA) 100 MG tablet Take 100 mg by mouth as needed for erectile  dysfunction. 02/16/15  Yes [provider]  sodium chloride (OCEAN) 0.65 % SOLN nasal spray Place 1 spray into both nostrils 3 (three) times daily as needed for congestion.   Yes [provider]  diclofenac Sodium (VOLTAREN) 1 % GEL Apply 4 g topically 4 (four) times daily. 07/12/20   Darr, Edison Nasuti, PA-C  fluticasone (FLONASE) 50 MCG/ACT nasal spray Place 2 sprays into both nostrils daily. 12/04/19   Raylene Everts, MD    Inpatient Medications: Scheduled Meds:  aspirin EC  81 mg Oral Daily   carvedilol  25 mg Oral BID WC   enoxaparin (LOVENOX) injection  40 mg Subcutaneous Q24H   gabapentin  300 mg Oral BID   insulin aspart  0-9 Units Subcutaneous TID WC   insulin glargine-yfgn  25 Units Subcutaneous QHS   losartan  100 mg Oral Daily   pantoprazole  40 mg Oral Daily   Continuous Infusions:  sodium chloride     PRN Meds: acetaminophen **OR** acetaminophen  Allergies:    Allergies  Allergen Reactions   Fluticasone    Atorvastatin Other (See Comments)    Muscle pain/weaknesses (Myalgia)   Fosinopril Other (See Comments)   Pravastatin Other (See Comments)    Muscle pain/weaknesses (Myalgia)   Simvastatin Other (See Comments)    Muscle  pain/weaknesses (Myalgia)   Terazosin Other (See Comments)    Unknown reaction.    Social History:   Social History   Socioeconomic History   Marital status: Married    Spouse name: Not on file   Number of children: Not on file   Years of education: Not on file   Highest education level: Not on file  Occupational History   Not on file  Tobacco Use   Smoking status: Former    Packs/day: 3.00    Years: 13.00    Pack years: 39.00    Types: Cigarettes    Quit date: 34    Years since quitting: 37.0   Smokeless tobacco: Never  Substance and Sexual Activity   Alcohol use: Yes    Alcohol/week: 0.0 standard drinks    Comment: 10/25/2016 "quit years ago"   Drug use: No   Sexual activity: Yes    Birth control/protection: None    Comment: Married  Other Topics Concern   Not on file  Social History Narrative   No caffeine.   Social Determinants of Health   Financial Resource Strain: Not on file  Food Insecurity: Not on file  Transportation Needs: Not on file  Physical Activity: Not on file  Stress: Not on file  Social Connections: Not on file  Intimate Partner Violence: Not on file    Family History:    Family History  Problem Relation Age of Onset   Urinary tract infection Mother    Pneumonia Mother    Aneurysm Father    Dementia Sister    Stroke Sister    Sarcoidosis Sister    Healthy Sister      ROS:  Please see the history of present illness.   All other ROS reviewed and negative.     Physical Exam/Data:   Vitals:   11/02/21 1432 11/02/21 1515 11/02/21 2106 11/03/21 0606  BP: (!) 155/70  133/63 133/62  Pulse: 74  93 86  Resp: 20  16 (!) 22  Temp: 98.8 F (37.1 C) 98.1 F (36.7 C) 98.1 F (36.7 C) 98.1 F (36.7 C)  TempSrc: Oral Oral Oral Oral  SpO2: 100%  100% 100%  Weight:      Height:        Intake/Output Summary (Last 24 hours) at 11/03/2021 1343 Last data filed at 11/03/2021 0606 Gross per 24 hour  Intake 240 ml  Output 800 ml  Net  -560 ml   Last 3 Weights 11/01/2021 08/07/2021 03/23/2021  Weight (lbs) 243 lb 243 lb 3.2 oz 265 lb 14 oz  Weight (kg) 110.224 kg 110.315 kg 120.6 kg     Body mass index is 36.95 kg/m.  General:  Well nourished, well developed, in no acute distress HEENT: normal Neck: no JVD Vascular: No carotid bruits; Distal pulses 2+ bilaterally Cardiac:  normal S1, S2; RRR; no murmur  Lungs:  clear to auscultation bilaterally, no wheezing, rhonchi or rales  Abd: soft, nontender, no hepatomegaly  Ext: no edema Musculoskeletal:  No deformities, BUE and BLE strength normal and equal Skin: warm and dry  Neuro:  CNs 2-12 intact, no focal abnormalities noted Psych:  Normal affect   EKG:  The EKG was personally reviewed and demonstrates:  SR, 80 bpm q waves inferior leads (similar to prior tracings) Telemetry:  Telemetry was personally reviewed and demonstrates:  SR  Relevant CV Studies:  Echo: 11/02/21  IMPRESSIONS     1. Left ventricular ejection fraction, by estimation, is >75%. The left  ventricle has hyperdynamic function. The left ventricle has no regional  wall motion abnormalities. There is mild concentric left ventricular  hypertrophy. Left ventricular diastolic  parameters were normal.   2. Right ventricular systolic function is normal. The right ventricular  size is normal.   3. The mitral valve is grossly normal. No evidence of mitral valve  regurgitation. No evidence of mitral stenosis.   4. The aortic valve is tricuspid. There is mild calcification of the  aortic valve. There is mild thickening of the aortic valve. Aortic valve  regurgitation is not visualized. Aortic valve sclerosis is present, with  no evidence of aortic valve stenosis.   5. The inferior vena cava is normal in size with greater than 50%  respiratory variability, suggesting right atrial pressure of 3 mmHg.   Comparison(s): No significant change from prior study.   Conclusion(s)/Recommendation(s): Otherwise  normal echocardiogram, with  minor abnormalities described in the report.   FINDINGS   Left Ventricle: Left ventricular ejection fraction, by estimation, is  >75%. The left ventricle has hyperdynamic function. The left ventricle has  no regional wall motion abnormalities. Definity contrast agent was given  IV to delineate the left  ventricular endocardial borders. The left ventricular internal cavity size  was normal in size. There is mild concentric left ventricular hypertrophy.  Left ventricular diastolic parameters were normal.   Right Ventricle: The right ventricular size is normal. Right vetricular  wall thickness was not well visualized. Right ventricular systolic  function is normal.   Left Atrium: Left atrial size was normal in size.   Right Atrium: Right atrial size was not well visualized.   Pericardium: There is no evidence of pericardial effusion. Presence of  epicardial fat layer.   Mitral Valve: The mitral valve is grossly normal. Mild mitral annular  calcification. No evidence of mitral valve regurgitation. No evidence of  mitral valve stenosis.   Tricuspid Valve: The tricuspid valve is grossly normal. Tricuspid valve  regurgitation is trivial. No evidence of tricuspid stenosis.   Aortic Valve: The aortic valve is tricuspid. There is mild calcification  of the aortic valve. There is mild thickening of the aortic valve. Aortic  valve  regurgitation is not visualized. Aortic valve sclerosis is present,  with no evidence of aortic valve  stenosis.   Pulmonic Valve: The pulmonic valve was not well visualized. Pulmonic valve  regurgitation is not visualized. No evidence of pulmonic stenosis.   Aorta: The aortic root, ascending aorta, aortic arch and descending aorta  are all structurally normal, with no evidence of dilitation or  obstruction.   Venous: The inferior vena cava is normal in size with greater than 50%  respiratory variability, suggesting right atrial  pressure of 3 mmHg.   IAS/Shunts: The atrial septum is grossly normal.   Laboratory Data:  High Sensitivity Troponin:   Recent Labs  Lab 11/01/21 1855 11/01/21 2352 11/02/21 0051  TROPONINIHS 26* 10 10     Chemistry Recent Labs  Lab 11/01/21 1855 11/02/21 0051 11/02/21 0949  NA 133*  --  131*  K 3.9  --  4.0  CL 99  --  98  CO2 25  --  25  GLUCOSE 118*  --  225*  BUN 18  --  16  CREATININE 1.48* 1.46* 1.42*  CALCIUM 9.1  --  9.0  GFRNONAA 48* 49* 51*  ANIONGAP 9  --  8    Recent Labs  Lab 11/01/21 1855  PROT 8.2*  ALBUMIN 3.2*  AST 19  ALT 15  ALKPHOS 89  BILITOT 1.3*   Lipids No results for input(s): CHOL, TRIG, HDL, LABVLDL, LDLCALC, CHOLHDL in the last 168 hours.  Hematology Recent Labs  Lab 11/01/21 1855 11/02/21 0051 11/02/21 0949  WBC 11.4* 10.5 9.5  RBC 4.33 4.27 4.29  HGB 12.9* 12.6* 12.8*  HCT 39.5 39.6 39.0  MCV 91.2 92.7 90.9  MCH 29.8 29.5 29.8  MCHC 32.7 31.8 32.8  RDW 13.0 13.1 13.0  PLT 176 165 177   Thyroid No results for input(s): TSH, FREET4 in the last 168 hours.  BNPNo results for input(s): BNP, PROBNP in the last 168 hours.  DDimer No results for input(s): DDIMER in the last 168 hours.   Radiology/Studies:  DG Chest Port 1 View  Result Date: 11/01/2021 CLINICAL DATA:  Syncope. EXAM: PORTABLE CHEST 1 VIEW COMPARISON:  August 21, 2016 FINDINGS: Mild areas of atelectasis are seen within the bilateral lung bases. There is no evidence of a pleural effusion or pneumothorax. The heart size and mediastinal contours are within normal limits. Multilevel degenerative changes seen throughout the thoracic spine. IMPRESSION: Mild bibasilar atelectasis. Electronically Signed   By: Virgina Norfolk M.D.   On: 11/01/2021 19:31   ECHOCARDIOGRAM COMPLETE  Result Date: 11/02/2021    ECHOCARDIOGRAM REPORT   Patient Name:   Joel Rivera Date of Exam: 11/02/2021 Medical Rec #:  [id number]   Height:       68.0 in Accession #:    [id number]  Weight:        81.5 lb Date of Birth:  [birth year]   BSA:          1.395 m Patient Age:    55 years    BP:           145/77 mmHg Patient Gender: M           HR:           82 bpm. Exam Location:  Inpatient Procedure: 2D Echo, Cardiac Doppler, Color Doppler and Intracardiac            Opacification Agent Indications:    Syncope R55  History:  Patient has prior history of Echocardiogram examinations, most                 recent 05/11/2019. CHF; Risk Factors:Hypertension, Diabetes,                 Dyslipidemia and Sleep Apnea. Chronic kidney disease, Chronic                 anemia, Agent Orange Exposure. Syncope -cause not clear.  Sonographer:    Darlina Sicilian RDCS Referring Phys: Marge Duncans LAMA IMPRESSIONS  1. Left ventricular ejection fraction, by estimation, is >75%. The left ventricle has hyperdynamic function. The left ventricle has no regional wall motion abnormalities. There is mild concentric left ventricular hypertrophy. Left ventricular diastolic parameters were normal.  2. Right ventricular systolic function is normal. The right ventricular size is normal.  3. The mitral valve is grossly normal. No evidence of mitral valve regurgitation. No evidence of mitral stenosis.  4. The aortic valve is tricuspid. There is mild calcification of the aortic valve. There is mild thickening of the aortic valve. Aortic valve regurgitation is not visualized. Aortic valve sclerosis is present, with no evidence of aortic valve stenosis.  5. The inferior vena cava is normal in size with greater than 50% respiratory variability, suggesting right atrial pressure of 3 mmHg. Comparison(s): No significant change from prior study. Conclusion(s)/Recommendation(s): Otherwise normal echocardiogram, with minor abnormalities described in the report. FINDINGS  Left Ventricle: Left ventricular ejection fraction, by estimation, is >75%. The left ventricle has hyperdynamic function. The left ventricle has no regional wall motion abnormalities.  Definity contrast agent was given IV to delineate the left ventricular endocardial borders. The left ventricular internal cavity size was normal in size. There is mild concentric left ventricular hypertrophy. Left ventricular diastolic parameters were normal. Right Ventricle: The right ventricular size is normal. Right vetricular wall thickness was not well visualized. Right ventricular systolic function is normal. Left Atrium: Left atrial size was normal in size. Right Atrium: Right atrial size was not well visualized. Pericardium: There is no evidence of pericardial effusion. Presence of epicardial fat layer. Mitral Valve: The mitral valve is grossly normal. Mild mitral annular calcification. No evidence of mitral valve regurgitation. No evidence of mitral valve stenosis. Tricuspid Valve: The tricuspid valve is grossly normal. Tricuspid valve regurgitation is trivial. No evidence of tricuspid stenosis. Aortic Valve: The aortic valve is tricuspid. There is mild calcification of the aortic valve. There is mild thickening of the aortic valve. Aortic valve regurgitation is not visualized. Aortic valve sclerosis is present, with no evidence of aortic valve stenosis. Pulmonic Valve: The pulmonic valve was not well visualized. Pulmonic valve regurgitation is not visualized. No evidence of pulmonic stenosis. Aorta: The aortic root, ascending aorta, aortic arch and descending aorta are all structurally normal, with no evidence of dilitation or obstruction. Venous: The inferior vena cava is normal in size with greater than 50% respiratory variability, suggesting right atrial pressure of 3 mmHg. IAS/Shunts: The atrial septum is grossly normal.  LEFT VENTRICLE PLAX 2D LVIDd:         4.40 cm   Diastology LVIDs:         2.00 cm   LV e' medial:    5.98 cm/s LV PW:         1.10 cm   LV E/e' medial:  13.9 LV IVS:        1.20 cm   LV e' lateral:   6.42 cm/s LVOT diam:  2.00 cm   LV E/e' lateral: 13.0 LV SV:         58 LV SV  Index:   41 LVOT Area:     3.14 cm  RIGHT VENTRICLE RV S prime:     13.30 cm/s LEFT ATRIUM             Index LA diam:        2.00 cm 1.43 cm/m LA Vol (A2C):   29.1 ml 20.86 ml/m LA Vol (A4C):   31.8 ml 22.79 ml/m LA Biplane Vol: 31.4 ml 22.51 ml/m  AORTIC VALVE LVOT Vmax:   104.00 cm/s LVOT Vmean:  76.200 cm/s LVOT VTI:    0.184 m  AORTA Ao Root diam: 3.40 cm Ao Asc diam:  3.40 cm MITRAL VALVE MV Area (PHT): 5.31 cm     SHUNTS MV Decel Time: 143 msec     Systemic VTI:  0.18 m MV E velocity: 83.30 cm/s   Systemic Diam: 2.00 cm MV A velocity: 127.00 cm/s MV E/A ratio:  0.66 Buford Dresser MD Electronically signed by Buford Dresser MD Signature Date/Time: 11/02/2021/2:11:16 PM    Final      Assessment and Plan:   Joel Henri. is a 75 y.o. male with a hx of remote CAD, HTN with severe LVH, HFpEF, HLD, OSA on Cpap, claudication/PVD and DM who is being seen 11/03/2021 for the evaluation of syncope at the request of Dr. Darrick Meigs.  Syncope: reports poor PO intake that day, had only eaten an orange. Has also noted his blood pressures have been soft in the evenings recently as well. He was noted to be orthostatic this morning when checked. Echo showed hyperdynamic LVEF >75% with no rWMA noted -- chlorthalidone has been stopped -- losartan reduced from 100mg  to 50mg  daily, coreg 25mg  BID continued -- asked to keep a log of his BPs at home and bring to follow up appt  AKI: Cr 1.4 on admission -- as above chlorthalidone held, losartan reduced from 100 to 50mg  daily  HTN: he has actually lost a considerable amount of weight, but blood pressure meds have remained the same. -- rec's noted above  Hyponatremia: Na+ 133>>131  -- chlorthalidone stopped  HLD: on statin  DM: Hgb A1c 7.6 -- per primary   Risk Assessment/Risk Scores:     For questions or updates, please contact [place] Please consult [email or web address] for contact info under    Signed, Reino Bellis, NP  11/03/2021  1:43 PM  Agree with note by Reino Bellis NP-C  Joel Rivera was admitted for witnessed syncope while standing in line at [facility].  He is a patient of Dr. Thompson Caul and a peripheral vascular patient of mine.  He has a history of CAD and PAD in addition to hyperlipidemia, hypertension and diabetes.  Apparently he had only orange juice that day.  He felt dizzy and did not sit down but ultimately lost consciousness but quickly awoke.  His EKG shows no acute changes and telemetry is normal as well.  His troponins were 25.  He is never had chest pain.  His 2D echo was entirely normal.  His exam is benign.  I think an explanation is dehydration and hypotension.  He has lost 60 pounds and his medications for high blood pressure have not been adjusted.  I suggested that we stop his chlorthalidone.  He will keep a blood pressure log at home.  We will arrange close outpatient follow-up with Dr. Tamala Julian.  Lorretta Harp,  M.D., [facility], [place], [place] [address] [place], [place]  [id number]  [phone] 11/03/2021 3:21 PM

## 2021-11-03 NOTE — Progress Notes (Signed)
[facility]  PROGRESS NOTE  Joel Rivera. OEH:[id number] DOB: 1946-03-28 DOA: 11/01/2021 PCP: Jani Gravel, MD   Brief HPI:    75 year old male with history of CAD, hypertension, peripheral vascular disease, hyperlipidemia, diabetes mellitus type 2, CKD stage II, anemia, sleep apnea was brought to the ED after he had episode of loss of consciousness.  [place].  He had brief loss of consciousness and regained consciousness immediately.  Prior to losing consciousness he felt dizzy.  Did not have chest pain or shortness of breath.  Patient had similar episode several months ago.  At that time he did not seek medical care.  In the ED EKG showed normal sinus rhythm with QTC of 425 ms.  Troponin was elevated 26, 10.  COVID test negative.  Patient placed under observation for evaluation for syncope.   Subjective   Patient seen and examined, orthostatic vital signs obtained this morning are positive for orthostatic hypotension, which is likely cause of patient's syncope.   Assessment/Plan:    Syncope -Likely from orthostatic hypotension -Vitals obtained this morning showed blood pressure drop from systolic 037 mmHg to 93 mmHg -This is likely cause of patient's syncope -Patient is on high-dose chlorthalidone at home, I will discontinue chlorthalidone -We will start IV normal saline at 75 mill per hour -We will repeat orthostatic vital signs in a.m. -Echocardiogram shows EF 75%, no other abnormalities noted -Cardiac enzymes x3 were unremarkable -Cardiology also was consulted for possible arrhythmia evaluation with Holter monitor as outpatient  First-degree AV block -PR interval prolonged -Likely due to Coreg  Acute kidney injury on CKD stage IIIa -Likely from diuretics, chlorthalidone -We will discontinue chlorthalidone -We will cut down the dose  of Cozaar from 100 mg daily to 50 mg daily -Started on IV normal saline as  above -Follow BMP in am  Hypertension -Continue Coreg 25 mg twice daily, Cozaar at reduced dose as above -Blood pressure starts to rise, consider starting amlodipine   Medications     aspirin EC  81 mg Oral Daily   carvedilol  25 mg Oral BID WC   enoxaparin (LOVENOX) injection  40 mg Subcutaneous Q24H   gabapentin  300 mg Oral BID   insulin aspart  0-9 Units Subcutaneous TID WC   insulin glargine-yfgn  25 Units Subcutaneous QHS   losartan  100 mg Oral Daily   pantoprazole  40 mg Oral Daily     Data Reviewed:   CBG:  Recent Labs  Lab 11/02/21 1204 11/02/21 1715 11/02/21 2133 11/03/21 0746 11/03/21 1139  GLUCAP 150* 195* 193* 143* 202*    SpO2: 100 %    Vitals:   11/02/21 1432 11/02/21 1515 11/02/21 2106 11/03/21 0606  BP: (!) 155/70  133/63 133/62  Pulse: 74  93 86  Resp: 20  16 (!) 22  Temp: 98.8 F (37.1 C) 98.1 F (36.7 C) 98.1 F (36.7 C) 98.1 F (36.7 C)  TempSrc: Oral Oral Oral Oral  SpO2: 100%  100% 100%  Weight:      Height:         Intake/Output Summary (Last 24 hours) at 11/03/2021 1344 Last data filed at 11/03/2021 0606 Gross per 24 hour  Intake 240 ml  Output 800 ml  Net -560 ml    12/21 1901 - 12/23 0700 In: 240 [P.O.:240] Out: 1500 [Urine:1500]  Filed Weights   11/01/21 1811  Weight: 110.2 kg    Data Reviewed: Basic Metabolic Panel:  Recent Labs  Lab 11/01/21 1855 11/02/21 0051 11/02/21 0949  NA 133*  --  131*  K 3.9  --  4.0  CL 99  --  98  CO2 25  --  25  GLUCOSE 118*  --  225*  BUN 18  --  16  CREATININE 1.48* 1.46* 1.42*  CALCIUM 9.1  --  9.0   Liver Function Tests: Recent Labs  Lab 11/01/21 1855  AST 19  ALT 15  ALKPHOS 89  BILITOT 1.3*  PROT 8.2*  ALBUMIN 3.2*   No results for input(s): LIPASE, AMYLASE in the last 168 hours. No results for input(s): AMMONIA in the last 168 hours. CBC: Recent Labs  Lab 11/01/21 1855 11/02/21 0051 11/02/21 0949  WBC 11.4* 10.5 9.5  NEUTROABS 8.0*  --   --    HGB 12.9* 12.6* 12.8*  HCT 39.5 39.6 39.0  MCV 91.2 92.7 90.9  PLT 176 165 177   Cardiac Enzymes: No results for input(s): CKTOTAL, CKMB, CKMBINDEX, TROPONINI in the last 168 hours. BNP (last 3 results) No results for input(s): BNP in the last 8760 hours.  ProBNP (last 3 results) No results for input(s): PROBNP in the last 8760 hours.  CBG: Recent Labs  Lab 11/02/21 1204 11/02/21 1715 11/02/21 2133 11/03/21 0746 11/03/21 1139  GLUCAP 150* 195* 193* 143* 202*       Radiology Reports  DG Chest Port 1 View  Result Date: 11/01/2021 CLINICAL DATA:  Syncope. EXAM: PORTABLE CHEST 1 VIEW COMPARISON:  August 21, 2016 FINDINGS: Mild areas of atelectasis are seen within the bilateral lung bases. There is no evidence of a pleural effusion or pneumothorax. The heart size and mediastinal contours are within normal limits. Multilevel degenerative changes seen throughout the thoracic spine. IMPRESSION: Mild bibasilar atelectasis. Electronically Signed   By: Virgina Norfolk M.D.   On: 11/01/2021 19:31   ECHOCARDIOGRAM COMPLETE  Result Date: 11/02/2021    ECHOCARDIOGRAM REPORT   Patient Name:   Joel Rivera Date of Exam: 11/02/2021 Medical Rec #:  [id number]   Height:       68.0 in Accession #:    [id number]  Weight:       81.5 lb Date of Birth:  04-Jun-1946   BSA:          1.395 m Patient Age:    1 years    BP:           145/77 mmHg Patient Gender: M           HR:           82 bpm. Exam Location:  Inpatient Procedure: 2D Echo, Cardiac Doppler, Color Doppler and Intracardiac            Opacification Agent Indications:    Syncope R55  History:        Patient has prior history of Echocardiogram examinations, most                 recent 05/11/2019. CHF; Risk Factors:Hypertension, Diabetes,                 Dyslipidemia and Sleep Apnea. Chronic kidney disease, Chronic                 anemia, Agent Orange Exposure. Syncope -cause not clear.  Sonographer:    Darlina Sicilian RDCS Referring Phys: Marge Duncans  Karissa Meenan IMPRESSIONS  1. Left ventricular ejection fraction, by estimation, is >75%. The left ventricle has hyperdynamic function. The left ventricle has  no regional wall motion abnormalities. There is mild concentric left ventricular hypertrophy. Left ventricular diastolic parameters were normal.  2. Right ventricular systolic function is normal. The right ventricular size is normal.  3. The mitral valve is grossly normal. No evidence of mitral valve regurgitation. No evidence of mitral stenosis.  4. The aortic valve is tricuspid. There is mild calcification of the aortic valve. There is mild thickening of the aortic valve. Aortic valve regurgitation is not visualized. Aortic valve sclerosis is present, with no evidence of aortic valve stenosis.  5. The inferior vena cava is normal in size with greater than 50% respiratory variability, suggesting right atrial pressure of 3 mmHg. Comparison(s): No significant change from prior study. Conclusion(s)/Recommendation(s): Otherwise normal echocardiogram, with minor abnormalities described in the report. FINDINGS  Left Ventricle: Left ventricular ejection fraction, by estimation, is >75%. The left ventricle has hyperdynamic function. The left ventricle has no regional wall motion abnormalities. Definity contrast agent was given IV to delineate the left ventricular endocardial borders. The left ventricular internal cavity size was normal in size. There is mild concentric left ventricular hypertrophy. Left ventricular diastolic parameters were normal. Right Ventricle: The right ventricular size is normal. Right vetricular wall thickness was not well visualized. Right ventricular systolic function is normal. Left Atrium: Left atrial size was normal in size. Right Atrium: Right atrial size was not well visualized. Pericardium: There is no evidence of pericardial effusion. Presence of epicardial fat layer. Mitral Valve: The mitral valve is grossly normal. Mild mitral annular  calcification. No evidence of mitral valve regurgitation. No evidence of mitral valve stenosis. Tricuspid Valve: The tricuspid valve is grossly normal. Tricuspid valve regurgitation is trivial. No evidence of tricuspid stenosis. Aortic Valve: The aortic valve is tricuspid. There is mild calcification of the aortic valve. There is mild thickening of the aortic valve. Aortic valve regurgitation is not visualized. Aortic valve sclerosis is present, with no evidence of aortic valve stenosis. Pulmonic Valve: The pulmonic valve was not well visualized. Pulmonic valve regurgitation is not visualized. No evidence of pulmonic stenosis. Aorta: The aortic root, ascending aorta, aortic arch and descending aorta are all structurally normal, with no evidence of dilitation or obstruction. Venous: The inferior vena cava is normal in size with greater than 50% respiratory variability, suggesting right atrial pressure of 3 mmHg. IAS/Shunts: The atrial septum is grossly normal.  LEFT VENTRICLE PLAX 2D LVIDd:         4.40 cm   Diastology LVIDs:         2.00 cm   LV e' medial:    5.98 cm/s LV PW:         1.10 cm   LV E/e' medial:  13.9 LV IVS:        1.20 cm   LV e' lateral:   6.42 cm/s LVOT diam:     2.00 cm   LV E/e' lateral: 13.0 LV SV:         58 LV SV Index:   41 LVOT Area:     3.14 cm  RIGHT VENTRICLE RV S prime:     13.30 cm/s LEFT ATRIUM             Index LA diam:        2.00 cm 1.43 cm/m LA Vol (A2C):   29.1 ml 20.86 ml/m LA Vol (A4C):   31.8 ml 22.79 ml/m LA Biplane Vol: 31.4 ml 22.51 ml/m  AORTIC VALVE LVOT Vmax:   104.00 cm/s LVOT Vmean:  76.200  cm/s LVOT VTI:    0.184 m  AORTA Ao Root diam: 3.40 cm Ao Asc diam:  3.40 cm MITRAL VALVE MV Area (PHT): 5.31 cm     SHUNTS MV Decel Time: 143 msec     Systemic VTI:  0.18 m MV E velocity: 83.30 cm/s   Systemic Diam: 2.00 cm MV A velocity: 127.00 cm/s MV E/A ratio:  0.66 Buford Dresser MD Electronically signed by Buford Dresser MD Signature Date/Time:  11/02/2021/2:11:16 PM    Final        Antibiotics: Anti-infectives (From admission, onward)    None         DVT prophylaxis: Lovenox  Code Status: Full code  Family Communication: No family at bedside   Consultants:   Procedures:     Objective    Physical Examination:  General-appears in no acute distress Heart-S1-S2, regular, no murmur auscultated Lungs-clear to auscultation bilaterally, no wheezing or crackles auscultated Abdomen-soft, nontender, no organomegaly Extremities-no edema in the lower extremities Neuro-alert, oriented x3, no focal deficit noted   Status is: Inpatient  Dispo: The patient is from: Home              Anticipated d/c is to: Home              Anticipated d/c date is: 11/04/2021              Patient currently not stable for discharge  Barrier to discharge-ongoing evaluation for syncope  COVID-19 Labs  No results for input(s): DDIMER, FERRITIN, LDH, CRP in the last 72 hours.  Lab Results  Component Value Date   SARSCOV2NAA NEGATIVE 11/01/2021   [place] NEGATIVE 03/20/2021   SARSCOV2NAA NOT DETECTED 12/04/2019            Recent Results (from the past 240 hour(s))  Resp Panel by RT-PCR (Flu A&B, Covid) Nasopharyngeal Swab     Status: None   Collection Time: 11/01/21 10:34 PM   Specimen: Nasopharyngeal Swab; Nasopharyngeal(NP) swabs in vial transport medium  Result Value Ref Range Status   SARS Coronavirus 2 by RT PCR NEGATIVE NEGATIVE Final    Comment: (NOTE) SARS-CoV-2 target nucleic acids are NOT DETECTED.  The SARS-CoV-2 RNA is generally detectable in upper respiratory specimens during the acute phase of infection. The lowest concentration of SARS-CoV-2 viral copies this assay can detect is 138 copies/mL. A negative result does not preclude SARS-Cov-2 infection and should not be used as the sole basis for treatment or other patient management decisions. A negative result may occur with  improper specimen  collection/handling, submission of specimen other than nasopharyngeal swab, presence of viral mutation(s) within the areas targeted by this assay, and inadequate number of viral copies(<138 copies/mL). A negative result must be combined with clinical observations, patient history, and epidemiological information. The expected result is Negative.  Fact Sheet for Patients:  EntrepreneurPulse.com.au  Fact Sheet for Healthcare Providers:  IncredibleEmployment.be  This test is no t yet approved or cleared by the [place] FDA and  has been authorized for detection and/or diagnosis of SARS-CoV-2 by FDA under an Emergency Use Authorization (EUA). This EUA will remain  in effect (meaning this test can be used) for the duration of the COVID-19 declaration under Section 564(b)(1) of the Act, 21 U.S.C.section 360bbb-3(b)(1), unless the authorization is terminated  or revoked sooner.       Influenza A by PCR NEGATIVE NEGATIVE Final   Influenza B by PCR NEGATIVE NEGATIVE Final    Comment: (NOTE) The Xpert Xpress  SARS-CoV-2/FLU/RSV plus assay is intended as an aid in the diagnosis of influenza from Nasopharyngeal swab specimens and should not be used as a sole basis for treatment. Nasal washings and aspirates are unacceptable for Xpert Xpress SARS-CoV-2/FLU/RSV testing.  Fact Sheet for Patients: EntrepreneurPulse.com.au  Fact Sheet for Healthcare Providers: IncredibleEmployment.be  This test is not yet approved or cleared by the [place] FDA and has been authorized for detection and/or diagnosis of SARS-CoV-2 by FDA under an Emergency Use Authorization (EUA). This EUA will remain in effect (meaning this test can be used) for the duration of the COVID-19 declaration under Section 564(b)(1) of the Act, 21 U.S.C. section 360bbb-3(b)(1), unless the authorization is terminated or revoked.  Performed at [facility], [place] [address] [place], [place] [address]     [facility] If 7PM-7AM, please contact night-coverage at [email or web address], Office  [phone]   11/03/2021, 1:44 PM  LOS: 0 days

## 2021-11-03 NOTE — Progress Notes (Signed)
Patient refused use of CPAP for the evening 

## 2021-11-04 LAB — GLUCOSE, CAPILLARY: Glucose-Capillary: 160 mg/dL — ABNORMAL HIGH (ref 70–99)

## 2021-11-04 MED ORDER — LOSARTAN POTASSIUM 100 MG PO TABS: 50.0000 mg | ORAL_TABLET | Freq: Every day | ORAL | 12 refills | Status: AC

## 2021-11-04 NOTE — Discharge Summary (Signed)
Physician Discharge Summary  [name] PIR:[id number] DOB: Sep 19, 1946 DOA: 11/01/2021  PCP: Jani Gravel, MD  Admit date: 11/01/2021 Discharge date: 11/04/2021  Admitted From: Home Disposition:  Home  Discharge Condition:Stable CODE STATUS:FULL Diet recommendation: Heart Healthy   Brief/Interim Summary:  75 year old male with history of CAD, hypertension, peripheral vascular disease, hyperlipidemia, diabetes mellitus type 2, CKD stage II, anemia, sleep apnea was brought to the ED after he had episode of loss of consciousness.  [place]  He had brief loss of consciousness and regained consciousness immediately.  Prior to losing consciousness he felt dizzy.  Did not have chest pain or shortness of breath.  Patient had similar episode several months ago.  At that time he did not seek medical care.  In the ED EKG showed normal sinus rhythm with QTC of 425 ms.  Troponin was elevated 26, 10.  COVID test negative.  Cardiology was consulted after admission.  His hospital course remained stable.  His syncopal episode was suspected to be from orthostasis.  Chlorthalidone discontinued, dose of losartan decreased.  Currently he is ambulating without any problem.  Cardiology recommended to follow-up as an outpatient.  He is medically stable for discharge.   Following problems were addressed during his hospitalization:   Syncope -Likely from orthostatic hypotension -Vitals obtained this morning showed blood pressure drop from systolic 630 mmHg to 93 mmHg -This is likely cause of patient's syncope -Patient is on high-dose chlorthalidone at home, we will discontinue chlorthalidone -Echocardiogram shows EF 75%, no other abnormalities noted -Cardiac enzymes x3 were unremarkable -Cardiology also was consulted for possible arrhythmia /evaluation with Holter monitor as outpatient.  Now recommended to follow-up as an outpatient   First-degree  AV block -PR interval prolonged -Likely due to Coreg, will be continued   AKI on CKD stage II -Likely from diuretics, chlorthalidone -We will discontinue chlorthalidone -We will cut down the dose  of Cozaar from 100 mg daily to 50 mg daily -Kidney function currently stable   Hypertension -Continue Coreg 25 mg twice daily, Cozaar at reduced dose as above -Currently blood pressure stable  Morbid obesity:  -BMI of 36.9    Discharge Diagnoses:  Principal Problem:   Syncope Active Problems:   OSA (obstructive sleep apnea)   Coronary artery disease involving native heart   PAD (peripheral artery disease) (HCC)   CKD (chronic kidney disease), stage III ([place])   Hypertension    Discharge Instructions  Discharge Instructions     Diet - low sodium heart healthy   Complete by: As directed    Discharge instructions   Complete by: As directed    1)Please take your medications as instructed 2)Follow up with your PCP in a week.  You will be called by cardiology for follow-up appointment 3)Please monitor blood sugars and blood pressure at home   Increase activity slowly   Complete by: As directed       Allergies as of 11/04/2021       Reactions   Fluticasone    Atorvastatin Other (See Comments)   Muscle pain/weaknesses (Myalgia)   Fosinopril Other (See Comments)   Pravastatin Other (See Comments)   Muscle pain/weaknesses (Myalgia)   Simvastatin Other (See Comments)   Muscle pain/weaknesses (Myalgia)   Terazosin Other (See Comments)   Unknown reaction.        Medication List     STOP taking these medications    chlorthalidone 50 MG tablet Commonly known as: HYGROTON  TAKE these medications    acetaminophen 325 MG tablet Commonly known as: TYLENOL Take 650 mg by mouth every 6 (six) hours as needed (for pain.).   aspirin EC 81 MG tablet Take 81 mg by mouth daily.   carvedilol 25 MG tablet Commonly known as: Coreg Take 1 tablet (25 mg total) by  mouth 2 (two) times daily with a meal.   cetirizine 10 MG tablet Commonly known as: ZYRTEC Take 10 mg by mouth daily.   cholecalciferol 1000 units tablet Commonly known as: VITAMIN D Take 1,000 Units by mouth daily.   citalopram 20 MG tablet Commonly known as: CELEXA Take 10 mg by mouth daily.   diclofenac Sodium 1 % Gel Commonly known as: Voltaren Apply 4 g topically 4 (four) times daily.   fluticasone 50 MCG/ACT nasal spray Commonly known as: FLONASE Place 2 sprays into both nostrils daily.   gabapentin 300 MG capsule Commonly known as: NEURONTIN Take 300 mg by mouth 2 (two) times daily.   guaiFENesin 600 MG 12 hr tablet Commonly known as: MUCINEX Take 600 mg by mouth daily as needed for cough or to loosen phlegm.   HYDROcodone-acetaminophen 5-325 MG tablet Commonly known as: NORCO/VICODIN Take 1 tablet by mouth every 6 (six) hours as needed for moderate pain.   Insulin Glargine-Lixisenatide 100-33 UNT-MCG/ML Sopn Inject 50 Units into the skin daily.   ipratropium 0.06 % nasal spray Commonly known as: ATROVENT as needed.   Jardiance 25 MG Tabs tablet Generic drug: empagliflozin Take 25 mg by mouth daily.   linaclotide 145 MCG Caps capsule Commonly known as: LINZESS as needed.   losartan 100 MG tablet Commonly known as: COZAAR Take 0.5 tablets (50 mg total) by mouth daily. What changed: how much to take   pantoprazole 40 MG tablet Commonly known as: PROTONIX Take 40 mg by mouth daily.   polyethylene glycol powder 17 GM/SCOOP powder Commonly known as: GLYCOLAX/MIRALAX Take 17 g by mouth daily.   rosuvastatin 40 MG tablet Commonly known as: CRESTOR Take 40 mg by mouth daily at 12 noon.   sildenafil 100 MG tablet Commonly known as: VIAGRA Take 100 mg by mouth as needed for erectile dysfunction.   sodium chloride 0.65 % Soln nasal spray Commonly known as: OCEAN Place 1 spray into both nostrils 3 (three) times daily as needed for congestion.         Follow-up Information     Jani Gravel, MD. Schedule an appointment as soon as possible for a visit in 1 week(s).   Specialty: Internal Medicine Contact information: [address] [place] [address] [place] [address] [phone]                Allergies  Allergen Reactions   Fluticasone    Atorvastatin Other (See Comments)    Muscle pain/weaknesses (Myalgia)   Fosinopril Other (See Comments)   Pravastatin Other (See Comments)    Muscle pain/weaknesses (Myalgia)   Simvastatin Other (See Comments)    Muscle pain/weaknesses (Myalgia)   Terazosin Other (See Comments)    Unknown reaction.    Consultations: Cardiology   Procedures/Studies: [facility] Chest Port 1 View  Result Date: 11/01/2021 CLINICAL DATA:  Syncope. EXAM: PORTABLE CHEST 1 VIEW COMPARISON:  August 21, 2016 FINDINGS: Mild areas of atelectasis are seen within the bilateral lung bases. There is no evidence of a pleural effusion or pneumothorax. The heart size and mediastinal contours are within normal limits. Multilevel degenerative changes seen throughout the thoracic spine. IMPRESSION: Mild bibasilar atelectasis. Electronically  Signed   By: Virgina Norfolk M.D.   On: 11/01/2021 19:31   ECHOCARDIOGRAM COMPLETE  Result Date: 11/02/2021    ECHOCARDIOGRAM REPORT   Patient Name:   Joel Rivera Date of Exam: 11/02/2021 Medical Rec #:  [id number]   Height:       68.0 in Accession #:    [id number]  Weight:       81.5 lb Date of Birth:  [birth year]   BSA:          1.395 m Patient Age:    22 years    BP:           145/77 mmHg Patient Gender: M           HR:           82 bpm. Exam Location:  Inpatient Procedure: 2D Echo, Cardiac Doppler, Color Doppler and Intracardiac            Opacification Agent Indications:    Syncope R55  History:        Patient has prior history of Echocardiogram examinations, most                 recent 05/11/2019. CHF; Risk Factors:Hypertension, Diabetes,                 Dyslipidemia and Sleep Apnea.  Chronic kidney disease, Chronic                 anemia, Agent Orange Exposure. Syncope -cause not clear.  Sonographer:    Darlina Sicilian RDCS Referring Phys: Marge Duncans LAMA IMPRESSIONS  1. Left ventricular ejection fraction, by estimation, is >75%. The left ventricle has hyperdynamic function. The left ventricle has no regional wall motion abnormalities. There is mild concentric left ventricular hypertrophy. Left ventricular diastolic parameters were normal.  2. Right ventricular systolic function is normal. The right ventricular size is normal.  3. The mitral valve is grossly normal. No evidence of mitral valve regurgitation. No evidence of mitral stenosis.  4. The aortic valve is tricuspid. There is mild calcification of the aortic valve. There is mild thickening of the aortic valve. Aortic valve regurgitation is not visualized. Aortic valve sclerosis is present, with no evidence of aortic valve stenosis.  5. The inferior vena cava is normal in size with greater than 50% respiratory variability, suggesting right atrial pressure of 3 mmHg. Comparison(s): No significant change from prior study. Conclusion(s)/Recommendation(s): Otherwise normal echocardiogram, with minor abnormalities described in the report. FINDINGS  Left Ventricle: Left ventricular ejection fraction, by estimation, is >75%. The left ventricle has hyperdynamic function. The left ventricle has no regional wall motion abnormalities. Definity contrast agent was given IV to delineate the left ventricular endocardial borders. The left ventricular internal cavity size was normal in size. There is mild concentric left ventricular hypertrophy. Left ventricular diastolic parameters were normal. Right Ventricle: The right ventricular size is normal. Right vetricular wall thickness was not well visualized. Right ventricular systolic function is normal. Left Atrium: Left atrial size was normal in size. Right Atrium: Right atrial size was not well visualized.  Pericardium: There is no evidence of pericardial effusion. Presence of epicardial fat layer. Mitral Valve: The mitral valve is grossly normal. Mild mitral annular calcification. No evidence of mitral valve regurgitation. No evidence of mitral valve stenosis. Tricuspid Valve: The tricuspid valve is grossly normal. Tricuspid valve regurgitation is trivial. No evidence of tricuspid stenosis. Aortic Valve: The aortic valve is tricuspid. There is mild calcification of the aortic valve. There  is mild thickening of the aortic valve. Aortic valve regurgitation is not visualized. Aortic valve sclerosis is present, with no evidence of aortic valve stenosis. Pulmonic Valve: The pulmonic valve was not well visualized. Pulmonic valve regurgitation is not visualized. No evidence of pulmonic stenosis. Aorta: The aortic root, ascending aorta, aortic arch and descending aorta are all structurally normal, with no evidence of dilitation or obstruction. Venous: The inferior vena cava is normal in size with greater than 50% respiratory variability, suggesting right atrial pressure of 3 mmHg. IAS/Shunts: The atrial septum is grossly normal.  LEFT VENTRICLE PLAX 2D LVIDd:         4.40 cm   Diastology LVIDs:         2.00 cm   LV e' medial:    5.98 cm/s LV PW:         1.10 cm   LV E/e' medial:  13.9 LV IVS:        1.20 cm   LV e' lateral:   6.42 cm/s LVOT diam:     2.00 cm   LV E/e' lateral: 13.0 LV SV:         58 LV SV Index:   41 LVOT Area:     3.14 cm  RIGHT VENTRICLE RV S prime:     13.30 cm/s LEFT ATRIUM             Index LA diam:        2.00 cm 1.43 cm/m LA Vol (A2C):   29.1 ml 20.86 ml/m LA Vol (A4C):   31.8 ml 22.79 ml/m LA Biplane Vol: 31.4 ml 22.51 ml/m  AORTIC VALVE LVOT Vmax:   104.00 cm/s LVOT Vmean:  76.200 cm/s LVOT VTI:    0.184 m  AORTA Ao Root diam: 3.40 cm Ao Asc diam:  3.40 cm MITRAL VALVE MV Area (PHT): 5.31 cm     SHUNTS MV Decel Time: 143 msec     Systemic VTI:  0.18 m MV E velocity: 83.30 cm/s   Systemic Diam:  2.00 cm MV A velocity: 127.00 cm/s MV E/A ratio:  0.66 Buford Dresser MD Electronically signed by Buford Dresser MD Signature Date/Time: 11/02/2021/2:11:16 PM    Final       Subjective:  Patient seen and examined the bedside this morning.  Hemodynamically stable for discharge today   Discharge Exam: Vitals:   11/04/21 0549 11/04/21 0755  BP: 136/65 (!) 149/69  Pulse:  74  Resp: 18 20  Temp: 98.3 F (36.8 C)   SpO2:     Vitals:   11/03/21 2151 11/03/21 2200 11/04/21 0549 11/04/21 0755  BP: 135/79  136/65 (!) 149/69  Pulse: 73   74  Resp: (!) 21 (!) 22 18 20   Temp: 98.6 F (37 C)  98.3 F (36.8 C)   TempSrc: Oral  Oral   SpO2: 99%     Weight:      Height:        General: Pt is alert, awake, not in acute distress,obese Cardiovascular: RRR, S1/S2 +, no rubs, no gallops Respiratory: CTA bilaterally, no wheezing, no rhonchi Abdominal: Soft, NT, ND, bowel sounds + Extremities: no edema, no cyanosis    The results of significant diagnostics from this hospitalization (including imaging, microbiology, ancillary and laboratory) are listed below for reference.     Microbiology: Recent Results (from the past 240 hour(s))  Resp Panel by RT-PCR (Flu A&B, Covid) Nasopharyngeal Swab     Status: None   Collection Time: 11/01/21 10:34  PM   Specimen: Nasopharyngeal Swab; Nasopharyngeal(NP) swabs in vial transport medium  Result Value Ref Range Status   SARS Coronavirus 2 by RT PCR NEGATIVE NEGATIVE Final    Comment: (NOTE) SARS-CoV-2 target nucleic acids are NOT DETECTED.  The SARS-CoV-2 RNA is generally detectable in upper respiratory specimens during the acute phase of infection. The lowest concentration of SARS-CoV-2 viral copies this assay can detect is 138 copies/mL. A negative result does not preclude SARS-Cov-2 infection and should not be used as the sole basis for treatment or other patient management decisions. A negative result may occur with  improper  specimen collection/handling, submission of specimen other than nasopharyngeal swab, presence of viral mutation(s) within the areas targeted by this assay, and inadequate number of viral copies(<138 copies/mL). A negative result must be combined with clinical observations, patient history, and epidemiological information. The expected result is Negative.  Fact Sheet for Patients:  EntrepreneurPulse.com.au  Fact Sheet for Healthcare Providers:  IncredibleEmployment.be  This test is no t yet approved or cleared by the [place] FDA and  has been authorized for detection and/or diagnosis of SARS-CoV-2 by FDA under an Emergency Use Authorization (EUA). This EUA will remain  in effect (meaning this test can be used) for the duration of the COVID-19 declaration under Section 564(b)(1) of the Act, 21 U.S.C.section 360bbb-3(b)(1), unless the authorization is terminated  or revoked sooner.       Influenza A by PCR NEGATIVE NEGATIVE Final   Influenza B by PCR NEGATIVE NEGATIVE Final    Comment: (NOTE) The Xpert Xpress SARS-CoV-2/FLU/RSV plus assay is intended as an aid in the diagnosis of influenza from Nasopharyngeal swab specimens and should not be used as a sole basis for treatment. Nasal washings and aspirates are unacceptable for Xpert Xpress SARS-CoV-2/FLU/RSV testing.  Fact Sheet for Patients: EntrepreneurPulse.com.au  Fact Sheet for Healthcare Providers: IncredibleEmployment.be  This test is not yet approved or cleared by the [place] FDA and has been authorized for detection and/or diagnosis of SARS-CoV-2 by FDA under an Emergency Use Authorization (EUA). This EUA will remain in effect (meaning this test can be used) for the duration of the COVID-19 declaration under Section 564(b)(1) of the Act, 21 U.S.C. section 360bbb-3(b)(1), unless the authorization is terminated or revoked.  Performed at  [facility], [place] [address], [place], [place] [address]      Labs: BNP (last 3 results) No results for input(s): BNP in the last 8760 hours. Basic Metabolic Panel: Recent Labs  Lab 11/01/21 1855 11/02/21 0051 11/02/21 0949  NA 133*  --  131*  K 3.9  --  4.0  CL 99  --  98  CO2 25  --  25  GLUCOSE 118*  --  225*  BUN 18  --  16  CREATININE 1.48* 1.46* 1.42*  CALCIUM 9.1  --  9.0   Liver Function Tests: Recent Labs  Lab 11/01/21 1855  AST 19  ALT 15  ALKPHOS 89  BILITOT 1.3*  PROT 8.2*  ALBUMIN 3.2*   No results for input(s): LIPASE, AMYLASE in the last 168 hours. No results for input(s): AMMONIA in the last 168 hours. CBC: Recent Labs  Lab 11/01/21 1855 11/02/21 0051 11/02/21 0949  WBC 11.4* 10.5 9.5  NEUTROABS 8.0*  --   --   HGB 12.9* 12.6* 12.8*  HCT 39.5 39.6 39.0  MCV 91.2 92.7 90.9  PLT 176 165 177   Cardiac Enzymes: No results for input(s): CKTOTAL, CKMB, CKMBINDEX, TROPONINI in  the last 168 hours. BNP: Invalid input(s): POCBNP CBG: Recent Labs  Lab 11/03/21 0746 11/03/21 1139 11/03/21 1609 11/03/21 2154 11/04/21 0813  GLUCAP 143* 202* 232* 162* 160*   D-Dimer No results for input(s): DDIMER in the last 72 hours. Hgb A1c Recent Labs    11/02/21 0150  HGBA1C 7.6*   Lipid Profile No results for input(s): CHOL, HDL, LDLCALC, TRIG, CHOLHDL, LDLDIRECT in the last 72 hours. Thyroid function studies No results for input(s): TSH, T4TOTAL, T3FREE, THYROIDAB in the last 72 hours.  Invalid input(s): FREET3 Anemia work up No results for input(s): VITAMINB12, FOLATE, FERRITIN, TIBC, IRON, RETICCTPCT in the last 72 hours. Urinalysis    Component Value Date/Time   COLORURINE YELLOW 09/17/2016 2154   APPEARANCEUR CLEAR 09/17/2016 2154   LABSPEC 1.010 09/17/2016 2154   PHURINE 6.0 09/17/2016 2154   GLUCOSEU >1000 (A) 09/17/2016 2154   HGBUR LARGE (A) 09/17/2016 2154   BILIRUBINUR NEGATIVE 09/17/2016 2154   KETONESUR 40 (A)  09/17/2016 2154   PROTEINUR NEGATIVE 09/17/2016 2154   NITRITE NEGATIVE 09/17/2016 2154   LEUKOCYTESUR NEGATIVE 09/17/2016 2154   Sepsis Labs Invalid input(s): PROCALCITONIN,  WBC,  LACTICIDVEN Microbiology Recent Results (from the past 240 hour(s))  Resp Panel by RT-PCR (Flu A&B, Covid) Nasopharyngeal Swab     Status: None   Collection Time: 11/01/21 10:34 PM   Specimen: Nasopharyngeal Swab; Nasopharyngeal(NP) swabs in vial transport medium  Result Value Ref Range Status   SARS Coronavirus 2 by RT PCR NEGATIVE NEGATIVE Final    Comment: (NOTE) SARS-CoV-2 target nucleic acids are NOT DETECTED.  The SARS-CoV-2 RNA is generally detectable in upper respiratory specimens during the acute phase of infection. The lowest concentration of SARS-CoV-2 viral copies this assay can detect is 138 copies/mL. A negative result does not preclude SARS-Cov-2 infection and should not be used as the sole basis for treatment or other patient management decisions. A negative result may occur with  improper specimen collection/handling, submission of specimen other than nasopharyngeal swab, presence of viral mutation(s) within the areas targeted by this assay, and inadequate number of viral copies(<138 copies/mL). A negative result must be combined with clinical observations, patient history, and epidemiological information. The expected result is Negative.  Fact Sheet for Patients:  EntrepreneurPulse.com.au  Fact Sheet for Healthcare Providers:  IncredibleEmployment.be  This test is no t yet approved or cleared by the [place] FDA and  has been authorized for detection and/or diagnosis of SARS-CoV-2 by FDA under an Emergency Use Authorization (EUA). This EUA will remain  in effect (meaning this test can be used) for the duration of the COVID-19 declaration under Section 564(b)(1) of the Act, 21 U.S.C.section 360bbb-3(b)(1), unless the authorization is  terminated  or revoked sooner.       Influenza A by PCR NEGATIVE NEGATIVE Final   Influenza B by PCR NEGATIVE NEGATIVE Final    Comment: (NOTE) The Xpert Xpress SARS-CoV-2/FLU/RSV plus assay is intended as an aid in the diagnosis of influenza from Nasopharyngeal swab specimens and should not be used as a sole basis for treatment. Nasal washings and aspirates are unacceptable for Xpert Xpress SARS-CoV-2/FLU/RSV testing.  Fact Sheet for Patients: EntrepreneurPulse.com.au  Fact Sheet for Healthcare Providers: IncredibleEmployment.be  This test is not yet approved or cleared by the [place] FDA and has been authorized for detection and/or diagnosis of SARS-CoV-2 by FDA under an Emergency Use Authorization (EUA). This EUA will remain in effect (meaning this test can be used) for the duration of the COVID-19  declaration under Section 564(b)(1) of the Act, 21 U.S.C. section 360bbb-3(b)(1), unless the authorization is terminated or revoked.  Performed at [facility], [place] [address], [place], [place] [address]     Please note: You were cared for by a hospitalist during your hospital stay. Once you are discharged, your primary care physician will handle any further medical issues. Please note that NO REFILLS for any discharge medications will be authorized once you are discharged, as it is imperative that you return to your primary care physician (or establish a relationship with a primary care physician if you do not have one) for your post hospital discharge needs so that they can reassess your need for medications and monitor your lab values.    Time coordinating discharge: 40 minutes  SIGNED:   Shelly Coss, MD  [facility] 11/04/2021, 8:43 AM Pager [phone]  If 7PM-7AM, please contact night-coverage [email or web address] Password [email or web address]

## 2021-11-07 ENCOUNTER — Telehealth: Payer: Self-pay

## 2021-11-07 NOTE — Telephone Encounter (Signed)
Patient contacted regarding discharge from [facility] on 11/04/21  Patient understands to follow up with provider Laurann [place] NP on 12/08/21 at 10:05 am at [facility]. Patient understands discharge instructions? yes Patient understands medications and regiment? yes Patient understands to bring all medications to this visit? yes

## 2021-12-07 NOTE — Progress Notes (Signed)
Cardiology Office Note:    Date:  12/08/2021   ID:  Joel Henri., DOB 06/14/1946, MRN [id number]  PCP:  Merrilee Seashore, MD   [facility] Providers Cardiologist:  Sinclair Grooms, MD     Referring MD: Jani Gravel, MD   Chief Complaint: Follow-up of syncope and collapse  History of Present Illness:    Joel Dehn. is a 76 y.o. male with a hx of remote nonobstructive CAD, HTN with severe LVH by previous echo, PVC, hyperlipidemia, DM, CKD, sleep apnea, HFpEF, and PTSD.  He reports a history of cardiac catheterization in 2003 revealing non-critical CAD. He underwent staged right and left SFA intervention in 2017. He has been followed by Dr. Gwenlyn Found for PVD.   He was last seen in our office on 08/07/21 by Dr. Tamala Julian at which time he reported a 30 lb intentional weight loss since 10/21. His EKG demonstrated significant first-degree AV block with PR interval @ 276 ms. Dr/ Tamala Julian asked the patient to monitor his HR at home for a potential decrease in carvedilol dose.   He was brought to [facility] ED on 11/01/21 after an episode of loss of consciousness while inside a store shopping. He reported that he had not had much to eat that day and was standing in a check-out line when he began to feel dizzy and lightheaded. He had witnessed syncopal episode by bystanders. He recovered quickly and states he felt normal immediately following the event. EKG showed SR at 80 bpm with q waves in inferior leads, similar to prior tracing. His PR interval was 269 ms. Hs troponins peaked at 25. His echo on 12/22 revealed LVEF > 75%, no rwma, mild LVH. Initially his orthostatic vital signs were negative but later during admission they were positive. The cause of his syncope was thought to be dehydration and hypotension.  His medication doses were reduced in the setting of recent weight loss and orthostasis.  Today, he is here alone for follow-up. He reports he is feeling well, states his main problem is with sinus  drainage. He reports the symptoms he felt on 12/21 were a sudden dizziness and then found himself on the ground. He denies dizziness, lightheadedness, presyncope, or syncope since hospital discharge. He is monitoring his BP at home and reports it is high (140-150 mmHg) when he first wakes up. BP readings after he takes his medications are much better consistently < 130 mmHg. He sometimes feels that BP is too low later in the day after evening medications but it has not been < 95 mmHg which is a parameter he was given by another provider. He denies chest pain, shortness of breath, lower extremity edema, fatigue, palpitations, melena, hematuria, hemoptysis, diaphoresis, weakness, orthopnea, and PND.   Past Medical History:  Diagnosis Date   Agent orange exposure    Allergies    Arthritis    "neck; left shoulder" (10/25/2016)   CAD (coronary artery disease)    Carotid artery disease (HCC)    CHF (congestive heart failure) (HCC)    Chronic post-traumatic stress disorder (PTSD) 03/16/2015   Chronic sinus infection    CKD (chronic kidney disease), stage III ([place])    Claustrophobia 03/16/2015   Depression    Diabetic neuropathy (HCC)    Former tobacco use    GERD (gastroesophageal reflux disease)    Hyperlipidemia    Hypertension    Morbid obesity (HCC)    OSA (obstructive sleep apnea)    "been checked out several  times; each time never got any treatment ordered" (10/25/2016)   PAD (peripheral artery disease) ([place])    a. Diagnostic PV Angio 08/2016 -> s/p RLE PTA in 09/2016. b. s/p LLE PTA in 10/2016.   PTSD (post-traumatic stress disorder)    "I'm a Veteran; 100% disability"   Recurrent boils    abceses   Type II diabetes mellitus ([place])     Past Surgical History:  Procedure Laterality Date   ABCESS DRAINAGE     "fluid-filled boils removed off my upper back, left leg; and back of my neck"   ABDOMINAL HERNIA REPAIR  ~ 2008   CATARACT EXTRACTION W/ INTRAOCULAR LENS  IMPLANT, BILATERAL  Bilateral    COLONOSCOPY W/ BIOPSIES AND POLYPECTOMY     "was gettin colonoscopy q 5 yrs; 11 polyps found in Dec. 2016; going back in Feb 2018"   CORONARY ANGIOPLASTY  ~ 2004   CYST EXCISION Left 2008   inguinal area   CYST EXCISION N/A 03/23/2021   Procedure: SEBACEOUS CYST REMOVAL FROM BACK;  Surgeon: Erroll Luna, MD;  Location: [facility];  Service: General;  Laterality: N/A;   HERNIA REPAIR     "stomach; it's coming back again" (10/25/2016)   PERIPHERAL VASCULAR CATHETERIZATION N/A 08/27/2016   Procedure: Lower Extremity Angiography;  Surgeon: Lorretta Harp, MD;  Location: [facility];  Service: Cardiovascular;  Laterality: N/A;   PERIPHERAL VASCULAR CATHETERIZATION N/A 09/17/2016   Procedure: Lower Extremity Angiography;  Surgeon: Lorretta Harp, MD;  Location: [facility];  Service: Cardiovascular;  Laterality: N/A;   PERIPHERAL VASCULAR CATHETERIZATION Right 09/17/2016   Procedure: Peripheral Vascular Atherectomy;  Surgeon: Lorretta Harp, MD;  Location: [facility];  Service: Cardiovascular;  Laterality: Right;  Common Femoral    PERIPHERAL VASCULAR CATHETERIZATION Left 10/25/2016   Procedure: Peripheral Vascular Atherectomy;  Surgeon: Lorretta Harp, MD;  Location: [facility];  Service: Cardiovascular;  Laterality: Left;  SFA    Current Medications: Current Meds  Medication Sig   acetaminophen (TYLENOL) 325 MG tablet Take 650 mg by mouth every 6 (six) hours as needed (for pain.).   aspirin EC 81 MG tablet Take 81 mg by mouth daily.   carvedilol (COREG) 25 MG tablet Take 1 tablet (25 mg total) by mouth 2 (two) times daily with a meal.   cholecalciferol (VITAMIN D) 1000 UNITS tablet Take 1,000 Units by mouth daily.   citalopram (CELEXA) 20 MG tablet Take 10 mg by mouth daily.   diclofenac Sodium (VOLTAREN) 1 % GEL Apply 4 g topically 4 (four) times daily.   fluticasone (FLONASE) 50 MCG/ACT nasal spray Place 2 sprays into both  nostrils daily.   gabapentin (NEURONTIN) 300 MG capsule Take 300 mg by mouth 2 (two) times daily.   guaiFENesin (MUCINEX) 600 MG 12 hr tablet Take 600 mg by mouth daily as needed for cough or to loosen phlegm.   HYDROcodone-acetaminophen (NORCO/VICODIN) 5-325 MG tablet Take 1 tablet by mouth every 6 (six) hours as needed for moderate pain.   Insulin Glargine-Lixisenatide 100-33 UNT-MCG/ML SOPN Inject 43 Units into the skin daily.   ipratropium (ATROVENT) 0.06 % nasal spray as needed.   JARDIANCE 25 MG TABS tablet Take 25 mg by mouth daily.    linaclotide (LINZESS) 145 MCG CAPS capsule as needed.   losartan (COZAAR) 100 MG tablet Take 0.5 tablets (50 mg total) by mouth daily.   pantoprazole (PROTONIX) 40 MG tablet Take 40 mg by mouth daily.  polyethylene glycol powder (GLYCOLAX/MIRALAX) 17 GM/SCOOP powder Take 17 g by mouth daily.   sildenafil (VIAGRA) 100 MG tablet Take 100 mg by mouth as needed for erectile dysfunction.   sodium chloride (OCEAN) 0.65 % SOLN nasal spray Place 1 spray into both nostrils 3 (three) times daily as needed for congestion.     Allergies:   Fluticasone, Atorvastatin, Fosinopril, Pravastatin, Simvastatin, and Terazosin   Social History   Socioeconomic History   Marital status: Married    Spouse name: Not on file   Number of children: Not on file   Years of education: Not on file   Highest education level: Not on file  Occupational History   Not on file  Tobacco Use   Smoking status: Former    Packs/day: 3.00    Years: 13.00    Pack years: 39.00    Types: Cigarettes    Quit date: 22    Years since quitting: 37.0   Smokeless tobacco: Never  Substance and Sexual Activity   Alcohol use: Yes    Alcohol/week: 0.0 standard drinks    Comment: 10/25/2016 "quit years ago"   Drug use: No   Sexual activity: Yes    Birth control/protection: None    Comment: Married  Other Topics Concern   Not on file  Social History Narrative   No caffeine.   Social  Determinants of Health   Financial Resource Strain: Not on file  Food Insecurity: Not on file  Transportation Needs: Not on file  Physical Activity: Not on file  Stress: Not on file  Social Connections: Not on file     Family History: The patient's family history includes Aneurysm in his father; Dementia in his sister; Healthy in his sister; Pneumonia in his mother; Sarcoidosis in his sister; Stroke in his sister; Urinary tract infection in his mother.  ROS:   Please see the history of present illness.  All other systems reviewed and are negative.  Labs/Other Studies Reviewed:    The following studies were reviewed today:  Echo 11/02/21  Left Ventricle: Left ventricular ejection fraction, by estimation, is  >75%. The left ventricle has hyperdynamic function. The left ventricle has  no regional wall motion abnormalities. Definity contrast agent was given  IV to delineate the left ventricular endocardial borders. The left ventricular internal cavity size was normal in size. There is mild concentric left ventricular hypertrophy. Left ventricular diastolic parameters were normal.  Right Ventricle: The right ventricular size is normal. Right vetricular  wall thickness was not well visualized. Right ventricular systolic  function is normal.  Left Atrium: Left atrial size was normal in size.  Right Atrium: Right atrial size was not well visualized.  Pericardium: There is no evidence of pericardial effusion. Presence of  epicardial fat layer.  Mitral Valve: The mitral valve is grossly normal. Mild mitral annular  calcification. No evidence of mitral valve regurgitation. No evidence of  mitral valve stenosis.  Tricuspid Valve: The tricuspid valve is grossly normal. Tricuspid valve  regurgitation is trivial. No evidence of tricuspid stenosis.  Aortic Valve: The aortic valve is tricuspid. There is mild calcification  of the aortic valve. There is mild thickening of the aortic valve.  Aortic  valve regurgitation is not visualized. Aortic valve sclerosis is present,  with no evidence of aortic valve stenosis.  Pulmonic Valve: The pulmonic valve was not well visualized. Pulmonic valve  regurgitation is not visualized. No evidence of pulmonic stenosis.  Aorta: The aortic root, ascending aorta, aortic arch  and descending aorta  are all structurally normal, with no evidence of dilitation or  obstruction.  Venous: The inferior vena cava is normal in size with greater than 50%  respiratory variability, suggesting right atrial pressure of 3 mmHg.  IAS/Shunts: The atrial septum is grossly normal.   Recent Labs: 11/01/2021: ALT 15 11/02/2021: BUN 16; Creatinine, Ser 1.42; Hemoglobin 12.8; Platelets 177; Potassium 4.0; Sodium 131  Recent Lipid Panel No results found for: CHOL, TRIG, HDL, CHOLHDL, VLDL, LDLCALC, LDLDIRECT   Risk Assessment/Calculations:      Physical Exam:    VS:  BP 114/62    Pulse 70    Ht 5\' 8"  (1.727 m)    Wt 239 lb (108.4 kg)    SpO2 98%    BMI 36.34 kg/m     Wt Readings from Last 3 Encounters:  12/08/21 239 lb (108.4 kg)  11/01/21 243 lb (110.2 kg)  08/07/21 243 lb 3.2 oz (110.3 kg)     GEN:  Well nourished, well developed in no acute distress HEENT: Normal NECK: No JVD; No carotid bruits CARDIAC: RRR, no murmurs, rubs, gallops RESPIRATORY:  Clear to auscultation without rales, wheezing or rhonchi  ABDOMEN: Soft, non-tender, non-distended MUSCULOSKELETAL:  No edema; No deformity. 2+ pedal pulses, equal bilaterally SKIN: Warm and dry NEUROLOGIC:  Alert and oriented x 3 PSYCHIATRIC:  Normal affect   EKG:  EKG is not ordered today.    Diagnoses:    1. Coronary artery disease involving native coronary artery of native heart without angina pectoris   2. Syncope and collapse   3. Essential hypertension   4. Claudication ([place])   5. Hyperlipidemia LDL goal <70   6. Chronic heart failure with preserved ejection fraction (HFpEF) (HCC)     Assessment and Plan:     Syncope and collapse: No further episodes of syncope, presyncope, lightheadedness or dizziness since event that occurred on 11/01/21. He had poor po intake that day and had been shopping for over an hour when the event occurred. Symptoms most concerning for orthostasis with low suspicion for arrhythmia. He admits that he does not eat consistently throughout the day. We discussed the impact this can have on his BP and glucose. He hydrates well. Encouraged regular low sodium  meals and continued good hydration. Encouraged him to regularly monitor home BP and notify us if if SBP is consistently outside the parameters of < 100 or > 130 mmHg. If symptoms reoccur, would consider placement of Zio cardiac monitor.   Essential hypertension: Hypotension 12/22 was felt to be due to significant weight loss of 60 lbs over the last 12-14 months without reduction in BP medications. He is feeling well today and BP is stable. Encouraged him to monitor home BP readings. Encouraged increased physical activity to achieve 150 minutes moderate exercise each week, low sodium diet.  Continue losartan, carvedilol. Will recheck bmet today.   Chronic HFpEF: He denies dyspnea, orthopnea, pnd, edema. Appears euvolemic on exam today. He denies increase in dyspnea, weight, or edema since discontinuing chlorthalidone in December. No indication for further evaluation of CHF at this time. Continue GDMT including losartan, carvedilol, SGLT2 inhibitor.   CAD native without angina: He denies chest pain, dyspnea, or other symptom concerning for angina. He is not very active at home but has access to a treadmill. I have encouraged him to walk on the treadmill 30 minutes per day. No indication for further evaluation of ischemia at this time. No bleeding problems on aspirin. Continue GDMT including  aspirin, beta blocker, SGLT2 inhibitor.   Hyperlipidemia LDL goal < 70: LDL 65 3/22. He reported his statin was reduced  during hospitalization in December.  I later realized that he reported to the [facility] that he has discontinued the rosuvastatin. Would recommend that he restart statin at a lower dose and that we follow-up with lipid panel in 2-3 months. Start rosuvastatin 10 mg daily.   Disposition: 6 months with Dr. Tamala Julian   Medication Adjustments/Labs and Tests Ordered: Current medicines are reviewed at length with the patient today.  Concerns regarding medicines are outlined above.  Orders Placed This Encounter  Procedures   Basic metabolic panel   No orders of the defined types were placed in this encounter.   Patient Instructions  Medication Instructions:  Your Physician recommend you continue on your current medication as directed.    *If you need a refill on your cardiac medications before your next appointment, please call your pharmacy*   Lab Work: Your physician recommends that you return for lab work Today- BMET   If you have labs (blood work) drawn today and your tests are completely normal, you will receive your results only by: MyChart Message (if you have MyChart) OR A paper copy in the mail If you have any lab test that is abnormal or we need to change your treatment, we will call you to review the results.   Testing/Procedures: None ordered today    Follow-Up: At [facility], you and your health needs are our priority.  As part of our continuing mission to provide you with exceptional heart care, we have created designated Provider Care Teams.  These Care Teams include your primary Cardiologist (physician) and Advanced Practice Providers (APPs -  Physician Assistants and Nurse Practitioners) who all work together to provide you with the care you need, when you need it.  We recommend signing up for the patient portal called "MyChart".  Sign up information is provided on this After Visit Summary.  MyChart is used to connect with patients for Virtual Visits (Telemedicine).  Patients  are able to view lab/test results, encounter notes, upcoming appointments, etc.  Non-urgent messages can be sent to your provider as well.   To learn more about what you can do with MyChart, go to [email or web address].    Your next appointment:   6 month(s)  The format for your next appointment:   In Person  Provider:   Sinclair Grooms, MD     Other Instructions Tips to Measure your Blood Pressure Correctly  To determine whether you have hypertension, a medical professional will take a blood pressure reading. How you prepare for the test, the position of your arm, and other factors can change a blood pressure reading by 10% or more. That could be enough to hide high blood pressure, start you on a drug you don't really need, or lead your doctor to incorrectly adjust your medications.  National and international guidelines offer specific instructions for measuring blood pressure. If a doctor, nurse, or medical assistant isn't doing it right, don't hesitate to ask him or her to get with the guidelines.  Here's what you can do to ensure a correct reading:  Don't drink a caffeinated beverage or smoke during the 30 minutes before the test.  Sit quietly for five minutes before the test begins.  During the measurement, sit in a chair with your feet on the floor and your arm supported so your elbow is at about heart level.  The  inflatable part of the cuff should completely cover at least 80% of your upper arm, and the cuff should be placed on bare skin, not over a shirt.  Don't talk during the measurement.  Have your blood pressure measured twice, with a brief break in between. If the readings are different by 5 points or more, have it done a third time.  In 2017, new guidelines from the [place], the [employer] of Cardiology, and nine other health organizations lowered the diagnosis of high blood pressure to 130/80 mm Hg or higher for all adults. The guidelines also  redefined the various blood pressure categories to now include normal, elevated, Stage 1 hypertension, Stage 2 hypertension, and hypertensive crisis (see "Blood pressure categories").  Blood pressure categories  Blood pressure category SYSTOLIC (upper number)  DIASTOLIC (lower number)  Normal Less than 120 mm Hg and Less than 80 mm Hg  Elevated 120-129 mm Hg and Less than 80 mm Hg  High blood pressure: Stage 1 hypertension 130-139 mm Hg or 80-89 mm Hg  High blood pressure: Stage 2 hypertension 140 mm Hg or higher or 90 mm Hg or higher  Hypertensive crisis (consult your doctor immediately) Higher than 180 mm Hg and/or Higher than 120 mm Hg  Source: American Heart Association and American Stroke Association. For more on getting your blood pressure under control, buy Controlling Your Blood Pressure, a Special Health Report from [facility].   Blood Pressure Log   Date   Time  Blood Pressure  Position  Example: Nov 1 9 AM 124/78 sitting                                                     DASH Eating Plan DASH stands for Dietary Approaches to Stop Hypertension. The DASH eating plan is a healthy eating plan that has been shown to: Reduce high blood pressure (hypertension). Reduce your risk for type 2 diabetes, heart disease, and stroke. Help with weight loss. What are tips for following this plan? Reading food labels Check food labels for the amount of salt (sodium) per serving. Choose foods with less than 5 percent of the Daily Value of sodium. Generally, foods with less than 300 milligrams (mg) of sodium per serving fit into this eating plan. To find whole grains, look for the word "whole" as the first word in the ingredient list. Shopping Buy products labeled as "low-sodium" or "no salt added." Buy fresh foods. Avoid canned foods and pre-made or frozen meals. Cooking Avoid adding salt when cooking. Use salt-free seasonings or herbs instead of table salt  or sea salt. Check with your health care provider or pharmacist before using salt substitutes. Do not fry foods. Cook foods using healthy methods such as baking, boiling, grilling, roasting, and broiling instead. Cook with heart-healthy oils, such as olive, canola, avocado, soybean, or sunflower oil. Meal planning  Eat a balanced diet that includes: 4 or more servings of fruits and 4 or more servings of vegetables each day. Try to fill one-half of your plate with fruits and vegetables. 6-8 servings of whole grains each day. Less than 6 oz (170 g) of lean meat, poultry, or fish each day. A 3-oz (85-g) serving of meat is about the same size as a deck of cards. One egg equals 1 oz (28 g). 2-3 servings  of low-fat dairy each day. One serving is 1 cup (237 mL). 1 serving of nuts, seeds, or beans 5 times each week. 2-3 servings of heart-healthy fats. Healthy fats called omega-3 fatty acids are found in foods such as walnuts, flaxseeds, fortified milks, and eggs. These fats are also found in cold-water fish, such as sardines, salmon, and mackerel. Limit how much you eat of: Canned or prepackaged foods. Food that is high in trans fat, such as some fried foods. Food that is high in saturated fat, such as fatty meat. Desserts and other sweets, sugary drinks, and other foods with added sugar. Full-fat dairy products. Do not salt foods before eating. Do not eat more than 4 egg yolks a week. Try to eat at least 2 vegetarian meals a week. Eat more home-cooked food and less restaurant, buffet, and fast food. Lifestyle When eating at a restaurant, ask that your food be prepared with less salt or no salt, if possible. If you drink alcohol: Limit how much you use to: 0-1 drink a day for women who are not pregnant. 0-2 drinks a day for men. Be aware of how much alcohol is in your drink. In the [place] one drink equals one 12 oz bottle of beer (355 mL), one 5 oz glass of wine (148 mL), or one 1 oz glass of  hard liquor (44 mL). General information Avoid eating more than 2,300 mg of salt a day. If you have hypertension, you may need to reduce your sodium intake to 1,500 mg a day. Work with your health care provider to maintain a healthy body weight or to lose weight. Ask what an ideal weight is for you. Get at least 30 minutes of exercise that causes your heart to beat faster (aerobic exercise) most days of the week. Activities may include walking, swimming, or biking. Work with your health care provider or dietitian to adjust your eating plan to your individual calorie needs. What foods should I eat? Fruits All fresh, dried, or frozen fruit. Canned fruit in natural juice (without added sugar). Vegetables Fresh or frozen vegetables (raw, steamed, roasted, or grilled). Low-sodium or reduced-sodium tomato and vegetable juice. Low-sodium or reduced-sodium tomato sauce and tomato paste. Low-sodium or reduced-sodium canned vegetables. Grains Whole-grain or whole-wheat bread. Whole-grain or whole-wheat pasta. Brown rice. [place]. Bulgur. Whole-grain and low-sodium cereals. Pita bread. Low-fat, low-sodium crackers. Whole-wheat flour tortillas. Meats and other proteins Skinless chicken or [place]. Ground chicken or [place]. Pork with fat trimmed off. Fish and seafood. Egg whites. Dried beans, peas, or lentils. Unsalted nuts, nut butters, and seeds. Unsalted canned beans. Lean cuts of beef with fat trimmed off. Low-sodium, lean precooked or cured meat, such as sausages or meat loaves. Dairy Low-fat (1%) or fat-free (skim) milk. Reduced-fat, low-fat, or fat-free cheeses. Nonfat, low-sodium ricotta or cottage cheese. Low-fat or nonfat yogurt. Low-fat, low-sodium cheese. Fats and oils Soft margarine without trans fats. Vegetable oil. Reduced-fat, low-fat, or light mayonnaise and salad dressings (reduced-sodium). Canola, safflower, olive, avocado, soybean, and sunflower oils. Avocado. Seasonings and  condiments Herbs. Spices. Seasoning mixes without salt. Other foods Unsalted popcorn and pretzels. Fat-free sweets. The items listed above may not be a complete list of foods and beverages you can eat. Contact a dietitian for more information. What foods should I avoid? Fruits Canned fruit in a light or heavy syrup. Fried fruit. Fruit in cream or butter sauce. Vegetables Creamed or fried vegetables. Vegetables in a cheese sauce. Regular canned vegetables (not low-sodium or reduced-sodium). Regular canned  tomato sauce and paste (not low-sodium or reduced-sodium). Regular tomato and vegetable juice (not low-sodium or reduced-sodium). Angie Fava. Olives. Grains Baked goods made with fat, such as croissants, muffins, or some breads. Dry pasta or rice meal packs. Meats and other proteins Fatty cuts of meat. Ribs. Fried meat. Berniece Salines. Bologna, salami, and other precooked or cured meats, such as sausages or meat loaves. Fat from the back of a pig (fatback). Bratwurst. Salted nuts and seeds. Canned beans with added salt. Canned or smoked fish. Whole eggs or egg yolks. Chicken or [place] with skin. Dairy Whole or 2% milk, cream, and half-and-half. Whole or full-fat cream cheese. Whole-fat or sweetened yogurt. Full-fat cheese. Nondairy creamers. Whipped toppings. Processed cheese and cheese spreads. Fats and oils Butter. Stick margarine. Lard. Shortening. Ghee. Bacon fat. Tropical oils, such as coconut, palm kernel, or palm oil. Seasonings and condiments Onion salt, garlic salt, seasoned salt, table salt, and sea salt. Worcestershire sauce. Tartar sauce. Barbecue sauce. Teriyaki sauce. Soy sauce, including reduced-sodium. Steak sauce. Canned and packaged gravies. Fish sauce. Oyster sauce. Cocktail sauce. Store-bought horseradish. Ketchup. Mustard. Meat flavorings and tenderizers. Bouillon cubes. Hot sauces. Pre-made or packaged marinades. Pre-made or packaged taco seasonings. Relishes. Regular salad  dressings. Other foods Salted popcorn and pretzels. The items listed above may not be a complete list of foods and beverages you should avoid. Contact a dietitian for more information. Where to find more information National Heart, Lung, and Blood Institute: https://wilson-eaton.com/ American Heart Association: www.heart.org Academy of Nutrition and Dietetics: www.eatright.[place]: www.kidney.org Summary The DASH eating plan is a healthy eating plan that has been shown to reduce high blood pressure (hypertension). It may also reduce your risk for type 2 diabetes, heart disease, and stroke. When on the DASH eating plan, aim to eat more fresh fruits and vegetables, whole grains, lean proteins, low-fat dairy, and heart-healthy fats. With the DASH eating plan, you should limit salt (sodium) intake to 2,300 mg a day. If you have hypertension, you may need to reduce your sodium intake to 1,500 mg a day. Work with your health care provider or dietitian to adjust your eating plan to your individual calorie needs. This information is not intended to replace advice given to you by your health care provider. Make sure you discuss any questions you have with your health care provider. Document Revised: 10/02/2019 Document Reviewed: 10/02/2019 Elsevier Patient Education  2022 [place], Emmaline Life, NP  12/08/2021 9:58 AM    [place]

## 2021-12-08 ENCOUNTER — Encounter (HOSPITAL_BASED_OUTPATIENT_CLINIC_OR_DEPARTMENT_OTHER): Payer: Self-pay | Admitting: Nurse Practitioner

## 2021-12-08 ENCOUNTER — Other Ambulatory Visit: Payer: Self-pay

## 2021-12-08 ENCOUNTER — Ambulatory Visit (INDEPENDENT_AMBULATORY_CARE_PROVIDER_SITE_OTHER): Payer: No Typology Code available for payment source | Admitting: Nurse Practitioner

## 2021-12-08 VITALS — BP 114/62 | HR 70 | Ht 68.0 in | Wt 239.0 lb

## 2021-12-08 DIAGNOSIS — I2510 Atherosclerotic heart disease of native coronary artery without angina pectoris: Secondary | ICD-10-CM

## 2021-12-08 DIAGNOSIS — R55 Syncope and collapse: Secondary | ICD-10-CM

## 2021-12-08 DIAGNOSIS — I739 Peripheral vascular disease, unspecified: Secondary | ICD-10-CM

## 2021-12-08 DIAGNOSIS — E785 Hyperlipidemia, unspecified: Secondary | ICD-10-CM

## 2021-12-08 DIAGNOSIS — I10 Essential (primary) hypertension: Secondary | ICD-10-CM | POA: Diagnosis not present

## 2021-12-08 DIAGNOSIS — I5032 Chronic diastolic (congestive) heart failure: Secondary | ICD-10-CM

## 2021-12-08 LAB — BASIC METABOLIC PANEL
BUN/Creatinine Ratio: 13 (ref 10–24)
BUN: 18 mg/dL (ref 8–27)
CO2: 23 mmol/L (ref 20–29)
Calcium: 8.9 mg/dL (ref 8.6–10.2)
Chloride: 102 mmol/L (ref 96–106)
Creatinine, Ser: 1.42 mg/dL — ABNORMAL HIGH (ref 0.76–1.27)
Glucose: 153 mg/dL — ABNORMAL HIGH (ref 70–99)
Potassium: 4.6 mmol/L (ref 3.5–5.2)
Sodium: 139 mmol/L (ref 134–144)
eGFR: 51 mL/min/{1.73_m2} — ABNORMAL LOW (ref >59)

## 2021-12-08 NOTE — Patient Instructions (Signed)
Medication Instructions:  Your Physician recommend you continue on your current medication as directed.    *If you need a refill on your cardiac medications before your next appointment, please call your pharmacy*   Lab Work: Your physician recommends that you return for lab work Today- BMET   If you have labs (blood work) drawn today and your tests are completely normal, you will receive your results only by: MyChart Message (if you have MyChart) OR A paper copy in the mail If you have any lab test that is abnormal or we need to change your treatment, we will call you to review the results.   Testing/Procedures: None ordered today    Follow-Up: At [facility], you and your health needs are our priority.  As part of our continuing mission to provide you with exceptional heart care, we have created designated Provider Care Teams.  These Care Teams include your primary Cardiologist (physician) and Advanced Practice Providers (APPs -  Physician Assistants and Nurse Practitioners) who all work together to provide you with the care you need, when you need it.  We recommend signing up for the patient portal called "MyChart".  Sign up information is provided on this After Visit Summary.  MyChart is used to connect with patients for Virtual Visits (Telemedicine).  Patients are able to view lab/test results, encounter notes, upcoming appointments, etc.  Non-urgent messages can be sent to your provider as well.   To learn more about what you can do with MyChart, go to [email or web address].    Your next appointment:   6 month(s)  The format for your next appointment:   In Person  Provider:   Sinclair Grooms, MD     Other Instructions Tips to Measure your Blood Pressure Correctly  To determine whether you have hypertension, a medical professional will take a blood pressure reading. How you prepare for the test, the position of your arm, and other factors can change a blood pressure  reading by 10% or more. That could be enough to hide high blood pressure, start you on a drug you don't really need, or lead your doctor to incorrectly adjust your medications.  National and international guidelines offer specific instructions for measuring blood pressure. If a doctor, nurse, or medical assistant isn't doing it right, don't hesitate to ask him or her to get with the guidelines.  Here's what you can do to ensure a correct reading:  Don't drink a caffeinated beverage or smoke during the 30 minutes before the test.  Sit quietly for five minutes before the test begins.  During the measurement, sit in a chair with your feet on the floor and your arm supported so your elbow is at about heart level.  The inflatable part of the cuff should completely cover at least 80% of your upper arm, and the cuff should be placed on bare skin, not over a shirt.  Don't talk during the measurement.  Have your blood pressure measured twice, with a brief break in between. If the readings are different by 5 points or more, have it done a third time.  In 2017, new guidelines from the [place], the [employer] of Cardiology, and nine other health organizations lowered the diagnosis of high blood pressure to 130/80 mm Hg or higher for all adults. The guidelines also redefined the various blood pressure categories to now include normal, elevated, Stage 1 hypertension, Stage 2 hypertension, and hypertensive crisis (see "Blood pressure categories").  Blood pressure  categories  Blood pressure category SYSTOLIC (upper number)  DIASTOLIC (lower number)  Normal Less than 120 mm Hg and Less than 80 mm Hg  Elevated 120-129 mm Hg and Less than 80 mm Hg  High blood pressure: Stage 1 hypertension 130-139 mm Hg or 80-89 mm Hg  High blood pressure: Stage 2 hypertension 140 mm Hg or higher or 90 mm Hg or higher  Hypertensive crisis (consult your doctor immediately) Higher than 180 mm Hg and/or  Higher than 120 mm Hg  Source: American Heart Association and American Stroke Association. For more on getting your blood pressure under control, buy Controlling Your Blood Pressure, a Special Health Report from [facility] [place].   Blood Pressure Log   Date   Time  Blood Pressure  Position  Example: Nov 1 9 AM 124/78 sitting                                                     DASH Eating Plan DASH stands for Dietary Approaches to Stop Hypertension. The DASH eating plan is a healthy eating plan that has been shown to: Reduce high blood pressure (hypertension). Reduce your risk for type 2 diabetes, heart disease, and stroke. Help with weight loss. What are tips for following this plan? Reading food labels Check food labels for the amount of salt (sodium) per serving. Choose foods with less than 5 percent of the Daily Value of sodium. Generally, foods with less than 300 milligrams (mg) of sodium per serving fit into this eating plan. To find whole grains, look for the word "whole" as the first word in the ingredient list. Shopping Buy products labeled as "low-sodium" or "no salt added." Buy fresh foods. Avoid canned foods and pre-made or frozen meals. Cooking Avoid adding salt when cooking. Use salt-free seasonings or herbs instead of table salt or sea salt. Check with your health care provider or pharmacist before using salt substitutes. Do not fry foods. Cook foods using healthy methods such as baking, boiling, grilling, roasting, and broiling instead. Cook with heart-healthy oils, such as olive, canola, avocado, soybean, or sunflower oil. Meal planning  Eat a balanced diet that includes: 4 or more servings of fruits and 4 or more servings of vegetables each day. Try to fill one-half of your plate with fruits and vegetables. 6-8 servings of whole grains each day. Less than 6 oz (170 g) of lean meat, poultry, or fish each day. A 3-oz (85-g) serving of meat is  about the same size as a deck of cards. One egg equals 1 oz (28 g). 2-3 servings of low-fat dairy each day. One serving is 1 cup (237 mL). 1 serving of nuts, seeds, or beans 5 times each week. 2-3 servings of heart-healthy fats. Healthy fats called omega-3 fatty acids are found in foods such as walnuts, flaxseeds, fortified milks, and eggs. These fats are also found in cold-water fish, such as sardines, salmon, and mackerel. Limit how much you eat of: Canned or prepackaged foods. Food that is high in trans fat, such as some fried foods. Food that is high in saturated fat, such as fatty meat. Desserts and other sweets, sugary drinks, and other foods with added sugar. Full-fat dairy products. Do not salt foods before eating. Do not eat more than 4 egg yolks a week. Try to eat at least  2 vegetarian meals a week. Eat more home-cooked food and less restaurant, buffet, and fast food. Lifestyle When eating at a restaurant, ask that your food be prepared with less salt or no salt, if possible. If you drink alcohol: Limit how much you use to: 0-1 drink a day for women who are not pregnant. 0-2 drinks a day for men. Be aware of how much alcohol is in your drink. In the [place] one drink equals one 12 oz bottle of beer (355 mL), one 5 oz glass of wine (148 mL), or one 1 oz glass of hard liquor (44 mL). General information Avoid eating more than 2,300 mg of salt a day. If you have hypertension, you may need to reduce your sodium intake to 1,500 mg a day. Work with your health care provider to maintain a healthy body weight or to lose weight. Ask what an ideal weight is for you. Get at least 30 minutes of exercise that causes your heart to beat faster (aerobic exercise) most days of the week. Activities may include walking, swimming, or biking. Work with your health care provider or dietitian to adjust your eating plan to your individual calorie needs. What foods should I eat? Fruits All fresh, dried,  or frozen fruit. Canned fruit in natural juice (without added sugar). Vegetables Fresh or frozen vegetables (raw, steamed, roasted, or grilled). Low-sodium or reduced-sodium tomato and vegetable juice. Low-sodium or reduced-sodium tomato sauce and tomato paste. Low-sodium or reduced-sodium canned vegetables. Grains Whole-grain or whole-wheat bread. Whole-grain or whole-wheat pasta. Brown rice. [place]. Bulgur. Whole-grain and low-sodium cereals. Pita bread. Low-fat, low-sodium crackers. Whole-wheat flour tortillas. Meats and other proteins Skinless chicken or [place]. Ground chicken or [place]. Pork with fat trimmed off. Fish and seafood. Egg whites. Dried beans, peas, or lentils. Unsalted nuts, nut butters, and seeds. Unsalted canned beans. Lean cuts of beef with fat trimmed off. Low-sodium, lean precooked or cured meat, such as sausages or meat loaves. Dairy Low-fat (1%) or fat-free (skim) milk. Reduced-fat, low-fat, or fat-free cheeses. Nonfat, low-sodium ricotta or cottage cheese. Low-fat or nonfat yogurt. Low-fat, low-sodium cheese. Fats and oils Soft margarine without trans fats. Vegetable oil. Reduced-fat, low-fat, or light mayonnaise and salad dressings (reduced-sodium). Canola, safflower, olive, avocado, soybean, and sunflower oils. Avocado. Seasonings and condiments Herbs. Spices. Seasoning mixes without salt. Other foods Unsalted popcorn and pretzels. Fat-free sweets. The items listed above may not be a complete list of foods and beverages you can eat. Contact a dietitian for more information. What foods should I avoid? Fruits Canned fruit in a light or heavy syrup. Fried fruit. Fruit in cream or butter sauce. Vegetables Creamed or fried vegetables. Vegetables in a cheese sauce. Regular canned vegetables (not low-sodium or reduced-sodium). Regular canned tomato sauce and paste (not low-sodium or reduced-sodium). Regular tomato and vegetable juice (not low-sodium or  reduced-sodium). Angie Fava. Olives. Grains Baked goods made with fat, such as croissants, muffins, or some breads. Dry pasta or rice meal packs. Meats and other proteins Fatty cuts of meat. Ribs. Fried meat. Berniece Salines. Bologna, salami, and other precooked or cured meats, such as sausages or meat loaves. Fat from the back of a pig (fatback). Bratwurst. Salted nuts and seeds. Canned beans with added salt. Canned or smoked fish. Whole eggs or egg yolks. Chicken or [place] with skin. Dairy Whole or 2% milk, cream, and half-and-half. Whole or full-fat cream cheese. Whole-fat or sweetened yogurt. Full-fat cheese. Nondairy creamers. Whipped toppings. Processed cheese and cheese spreads. Fats and oils Butter. Stick  margarine. Lard. Shortening. Ghee. Bacon fat. Tropical oils, such as coconut, palm kernel, or palm oil. Seasonings and condiments Onion salt, garlic salt, seasoned salt, table salt, and sea salt. Worcestershire sauce. Tartar sauce. Barbecue sauce. Teriyaki sauce. Soy sauce, including reduced-sodium. Steak sauce. Canned and packaged gravies. Fish sauce. Oyster sauce. Cocktail sauce. Store-bought horseradish. Ketchup. Mustard. Meat flavorings and tenderizers. Bouillon cubes. Hot sauces. Pre-made or packaged marinades. Pre-made or packaged taco seasonings. Relishes. Regular salad dressings. Other foods Salted popcorn and pretzels. The items listed above may not be a complete list of foods and beverages you should avoid. Contact a dietitian for more information. Where to find more information National Heart, Lung, and Blood Institute: https://[place]kal-eaton.com/ American Heart Association: www.heart.org Academy of Nutrition and Dietetics: www.eatright.[place]: www.kidney.org Summary The DASH eating plan is a healthy eating plan that has been shown to reduce high blood pressure (hypertension). It may also reduce your risk for type 2 diabetes, heart disease, and stroke. When on the DASH  eating plan, aim to eat more fresh fruits and vegetables, whole grains, lean proteins, low-fat dairy, and heart-healthy fats. With the DASH eating plan, you should limit salt (sodium) intake to 2,300 mg a day. If you have hypertension, you may need to reduce your sodium intake to 1,500 mg a day. Work with your health care provider or dietitian to adjust your eating plan to your individual calorie needs. This information is not intended to replace advice given to you by your health care provider. Make sure you discuss any questions you have with your health care provider. Document Revised: 10/02/2019 Document Reviewed: 10/02/2019 Elsevier Patient Education  2022 [employer].

## 2021-12-11 ENCOUNTER — Telehealth (HOSPITAL_BASED_OUTPATIENT_CLINIC_OR_DEPARTMENT_OTHER): Payer: Self-pay

## 2021-12-11 DIAGNOSIS — Z789 Other specified health status: Secondary | ICD-10-CM

## 2021-12-11 DIAGNOSIS — E785 Hyperlipidemia, unspecified: Secondary | ICD-10-CM

## 2021-12-11 NOTE — Telephone Encounter (Addendum)
Patient is not the rosuvastatin due to muscle aches, but is open to trying something else if he needs it!     ----- Message from Emmaline Life, NP sent at 12/11/2021  7:45 AM EST ----- Kidney function and electrolytes are stable. Please ask him if he is taking rosuvastatin for his cholesterol (there was a discrepancy on his medication  list at the office visit last week). If he is not taking rosuvastatin, he needs to restart at 10 mg daily and we will recheck lipids/liver panel in 2-3 months. If he is already taking rosuvastatin, he should continue at current dose.

## 2021-12-11 NOTE — Telephone Encounter (Addendum)
RN attempted to call patient and got VM. Left message with instructions to call back or respond to mychart message!    ----- Message from Emmaline Life, NP sent at 12/11/2021  7:45 AM EST ----- Kidney function and electrolytes are stable. Please ask him if he is taking rosuvastatin for his cholesterol (there was a discrepancy on his medication  list at the office visit last week). If he is not taking rosuvastatin, he needs to restart at 10 mg daily and we will recheck lipids/liver panel in 2-3 months. If he is already taking rosuvastatin, he should continue at current dose.

## 2021-12-12 NOTE — Telephone Encounter (Signed)
If he is willing, please have him start rosuvastatin 10 mg every other day and then gradually increase to daily in about 2-3 weeks. Ask him to please call back to let us know if he is tolerating this and if not, we will refer to lipid clinic to get him on PCSK9-inhibitor.

## 2021-12-12 NOTE — Telephone Encounter (Signed)
Returned call to patient with Christen Bame, NP recommendations. Patient states he will not take the statin due to previous intolerances of statins , but is willing to be refereed to the lipid clinic! Orders placed and update routed to Christen Bame, NP for review!    "If he is willing, please have him start rosuvastatin 10 mg every other day and then gradually increase to daily in about 2-3 weeks. Ask him to please call back to let us know if he is tolerating this and if not, we will refer to lipid clinic to get him on PCSK9-inhibitor. "

## 2021-12-12 NOTE — Addendum Note (Signed)
Addended by: Gerald Stabs on: 12/12/2021 09:23 AM   Modules accepted: Orders

## 2022-01-03 ENCOUNTER — Ambulatory Visit: Payer: Medicare Other | Admitting: Pharmacist

## 2022-01-03 ENCOUNTER — Other Ambulatory Visit: Payer: Self-pay

## 2022-01-03 ENCOUNTER — Telehealth: Payer: Self-pay

## 2022-01-03 DIAGNOSIS — E782 Mixed hyperlipidemia: Secondary | ICD-10-CM

## 2022-01-03 LAB — LIPID PANEL
Chol/HDL Ratio: 5.7 ratio — ABNORMAL HIGH (ref 0.0–5.0)
Cholesterol, Total: 218 mg/dL — ABNORMAL HIGH (ref 100–199)
HDL: 38 mg/dL — ABNORMAL LOW (ref >39)
LDL Chol Calc (NIH): 161 mg/dL — ABNORMAL HIGH (ref 0–99)
Triglycerides: 107 mg/dL (ref 0–149)
VLDL Cholesterol Cal: 19 mg/dL (ref 5–40)

## 2022-01-03 NOTE — Telephone Encounter (Signed)
Lmom pcp to send over via fax any lab work done w/in last yr

## 2022-01-03 NOTE — Patient Instructions (Addendum)
It was nice to see you today  Your LDL cholesterol goal is < 55  I will send information to the [place] to see if they will cover Repatha injections for you

## 2022-01-03 NOTE — Progress Notes (Signed)
Patient ID: Joel Rivera.                 DOB: 06-27-1946                    MRN: [id number]     HPI: Joel Rivera. is a 76 y.o. male patient of Dr Tamala Julian referred to lipid clinic by Christen Bame, NP. PMH is significant for nonobstructive CAD, PAD s/p RLE and LLE PTA in 2017, HTN with severe LVH, HLD, DM, CKD, HFpEF, OSA, and PTSD. Prior syncopal episode 10/2021 secondary to dehydration, hypotension, and notable weight loss. Most recently seen 12/08/21 where he reported discontinuing his rosuvastatin secondary to intolerance. He was referred to lipid clinic for further management.  Pt reports experiencing myalgias on multiple statins. He experienced leg pain on atorvastatin, pravastatin, simvastatin, and rosuvastatin. He does not wish to retry statin therapy. He has been working on weight loss. Reports previously weighing 298 lbs and is now down to 239 lbs. Focused on walking more and watching his diet. States he stopped his rosuvastatin about 3 months ago.  Current Medications: none Intolerances: atorvastatin, pravastatin 5mg  every other day, simvastatin, rosuvastatin 20-40mg  daily - myalgias Risk Factors: CAD, PAD, DM, CKD, HTN, CHF, age LDL goal: 55mg /dL  Diet: No pork, more chicken and fish, more veggies, no more Pepsi will have Crystal Light  Exercise: Has been walking more  Family History: Aneurysm in his father; Dementia in his sister; Healthy in his sister; Pneumonia in his mother; Sarcoidosis in his sister; Stroke in his sister; Urinary tract infection in his mother.  Social History: Former tobacco use 3 PPD for 13 years, quit in 1986, denies alcohol and drug use.  Labs: 01/30/21: TC 177, TG 74, HDL 40, LDL 65 (was on statin)  Past Medical History:  Diagnosis Date   Agent orange exposure    Allergies    Arthritis    "neck; left shoulder" (10/25/2016)   CAD (coronary artery disease)    Carotid artery disease (HCC)    CHF (congestive heart failure) (HCC)    Chronic  post-traumatic stress disorder (PTSD) 03/16/2015   Chronic sinus infection    CKD (chronic kidney disease), stage III ([place])    Claustrophobia 03/16/2015   Depression    Diabetic neuropathy (HCC)    Former tobacco use    GERD (gastroesophageal reflux disease)    Hyperlipidemia    Hypertension    Morbid obesity (HCC)    OSA (obstructive sleep apnea)    "been checked out several times; each time never got any treatment ordered" (10/25/2016)   PAD (peripheral artery disease) ([place])    a. Diagnostic PV Angio 08/2016 -> s/p RLE PTA in 09/2016. b. s/p LLE PTA in 10/2016.   PTSD (post-traumatic stress disorder)    "I'm a Veteran; 100% disability"   Recurrent boils    abceses   Type II diabetes mellitus ([place])     Current Outpatient Medications on File Prior to Visit  Medication Sig Dispense Refill   acetaminophen (TYLENOL) 325 MG tablet Take 650 mg by mouth every 6 (six) hours as needed (for pain.).     aspirin EC 81 MG tablet Take 81 mg by mouth daily.     carvedilol (COREG) 25 MG tablet Take 1 tablet (25 mg total) by mouth 2 (two) times daily with a meal. 60 tablet 12   cholecalciferol (VITAMIN D) 1000 UNITS tablet Take 1,000 Units by mouth daily.     citalopram (CELEXA)  20 MG tablet Take 10 mg by mouth daily.     diclofenac Sodium (VOLTAREN) 1 % GEL Apply 4 g topically 4 (four) times daily. 100 g 0   fluticasone (FLONASE) 50 MCG/ACT nasal spray Place 2 sprays into both nostrils daily. 16 g 0   gabapentin (NEURONTIN) 300 MG capsule Take 300 mg by mouth 2 (two) times daily.     guaiFENesin (MUCINEX) 600 MG 12 hr tablet Take 600 mg by mouth daily as needed for cough or to loosen phlegm.     HYDROcodone-acetaminophen (NORCO/VICODIN) 5-325 MG tablet Take 1 tablet by mouth every 6 (six) hours as needed for moderate pain. 15 tablet 0   Insulin Glargine-Lixisenatide 100-33 UNT-MCG/ML SOPN Inject 43 Units into the skin daily.     ipratropium (ATROVENT) 0.06 % nasal spray as needed.     JARDIANCE 25  MG TABS tablet Take 25 mg by mouth daily.      linaclotide (LINZESS) 145 MCG CAPS capsule as needed.     losartan (COZAAR) 100 MG tablet Take 0.5 tablets (50 mg total) by mouth daily. 30 tablet 12   pantoprazole (PROTONIX) 40 MG tablet Take 40 mg by mouth daily.     polyethylene glycol powder (GLYCOLAX/MIRALAX) 17 GM/SCOOP powder Take 17 g by mouth daily. 255 g 0   sildenafil (VIAGRA) 100 MG tablet Take 100 mg by mouth as needed for erectile dysfunction.     sodium chloride (OCEAN) 0.65 % SOLN nasal spray Place 1 spray into both nostrils 3 (three) times daily as needed for congestion.     No current facility-administered medications on file prior to visit.    Allergies  Allergen Reactions   Fluticasone    Atorvastatin Other (See Comments)    Muscle pain/weaknesses (Myalgia)   Fosinopril Other (See Comments)   Pravastatin Other (See Comments)    Muscle pain/weaknesses (Myalgia)   Simvastatin Other (See Comments)    Muscle pain/weaknesses (Myalgia)   Terazosin Other (See Comments)    Unknown reaction.    Assessment/Plan:  1. Hyperlipidemia - LDL goal is < 55, pt currently on no lipid lowering therapy with prior intolerance to 4 statins. Rechecking baseline lipid panel today. Discussed starting PCSK9i which pt is agreeable to. Copay would be $45 with his [facility] Medicare plan but pt states he gets all of his medications for free from the [place] and requests to have prescription filled there. Will fax over prescription and chart notes to [place], they will need to provide final determination on coverage. Checking updated lipid panel today.  Tamyra Fojtik E. Matheson Vandehei, PharmD, BCACP, [place] [address], [place], [place] [address] Phone: [phone]; Fax: [phone] 01/03/2022 10:54 AM

## 2022-01-04 ENCOUNTER — Telehealth: Payer: Self-pay | Admitting: Pharmacist

## 2022-01-04 NOTE — Telephone Encounter (Signed)
Baseline LDL elevated at 161 since pt stopped his statin, goal < 55. I have faxed chart notes and updated labs to the [facility] at [phone] with attn to his doctor, Nolene Bernheim. [facility] is for [facility] to [facility] Repatha [facility] for pt as he does not wish to have rx filled through his Part D insurance with $45 copay.

## 2022-01-24 ENCOUNTER — Telehealth: Payer: Self-pay | Admitting: Pharmacist

## 2022-01-24 MED ORDER — ROSUVASTATIN CALCIUM 5 MG PO TABS
5.0000 mg | ORAL_TABLET | Freq: Every day | ORAL | 11 refills | Status: AC
Start: 2022-01-24 — End: ?

## 2022-01-24 NOTE — Addendum Note (Signed)
Addended by: Derward Marple E on: 01/24/2022 11:28 AM ? ? Modules accepted: Orders ? ?

## 2022-01-24 NOTE — Telephone Encounter (Signed)
Pt states the [facility] has not contacted him about starting Repatha. Advised him to give them a call to check. ? ?He wishes to take medication in the mean time for his cholesterol while he is waiting on the [place] for Repatha coverage. Will try low dose of rosuvastatin '5mg'$  daily, pt reports previously taking CoQ10 which helped. Advised he can resume this as well. ? ?I'll call pt in another month for statin tolerability and [facility] Repatha update. ?

## 2022-01-24 NOTE — Telephone Encounter (Signed)
Called pt and left message to see if [facility] agreed to approve Repatha coverage for him and if he has started on therapy. ?

## 2022-02-21 ENCOUNTER — Telehealth: Payer: Self-pay | Admitting: Pharmacist

## 2022-02-21 NOTE — Telephone Encounter (Signed)
Called pt to follow up with tolerability of rosuvastatin '5mg'$  daily - reports tolerating well. He has not heard from the [place] regarding Repatha coverage. States he reached out a bit ago and they stated they never received any paperwork from [place]. I faxed over information almost 2 months ago. Will refax again. Pt advised to call the [place] in another 2 weeks for update, and I'll call him in another month. He will continue on rosuvastatin '5mg'$  daily. ?

## 2022-03-21 ENCOUNTER — Telehealth: Payer: Self-pay | Admitting: Pharmacist

## 2022-03-21 IMAGING — DX DG CHEST 1V PORT
1 series · 1 of 1 positions shown · non-contrast
Comparison: August 21, 2016

CLINICAL DATA: Syncope.

EXAM:
PORTABLE CHEST 1 VIEW

[chest ap]
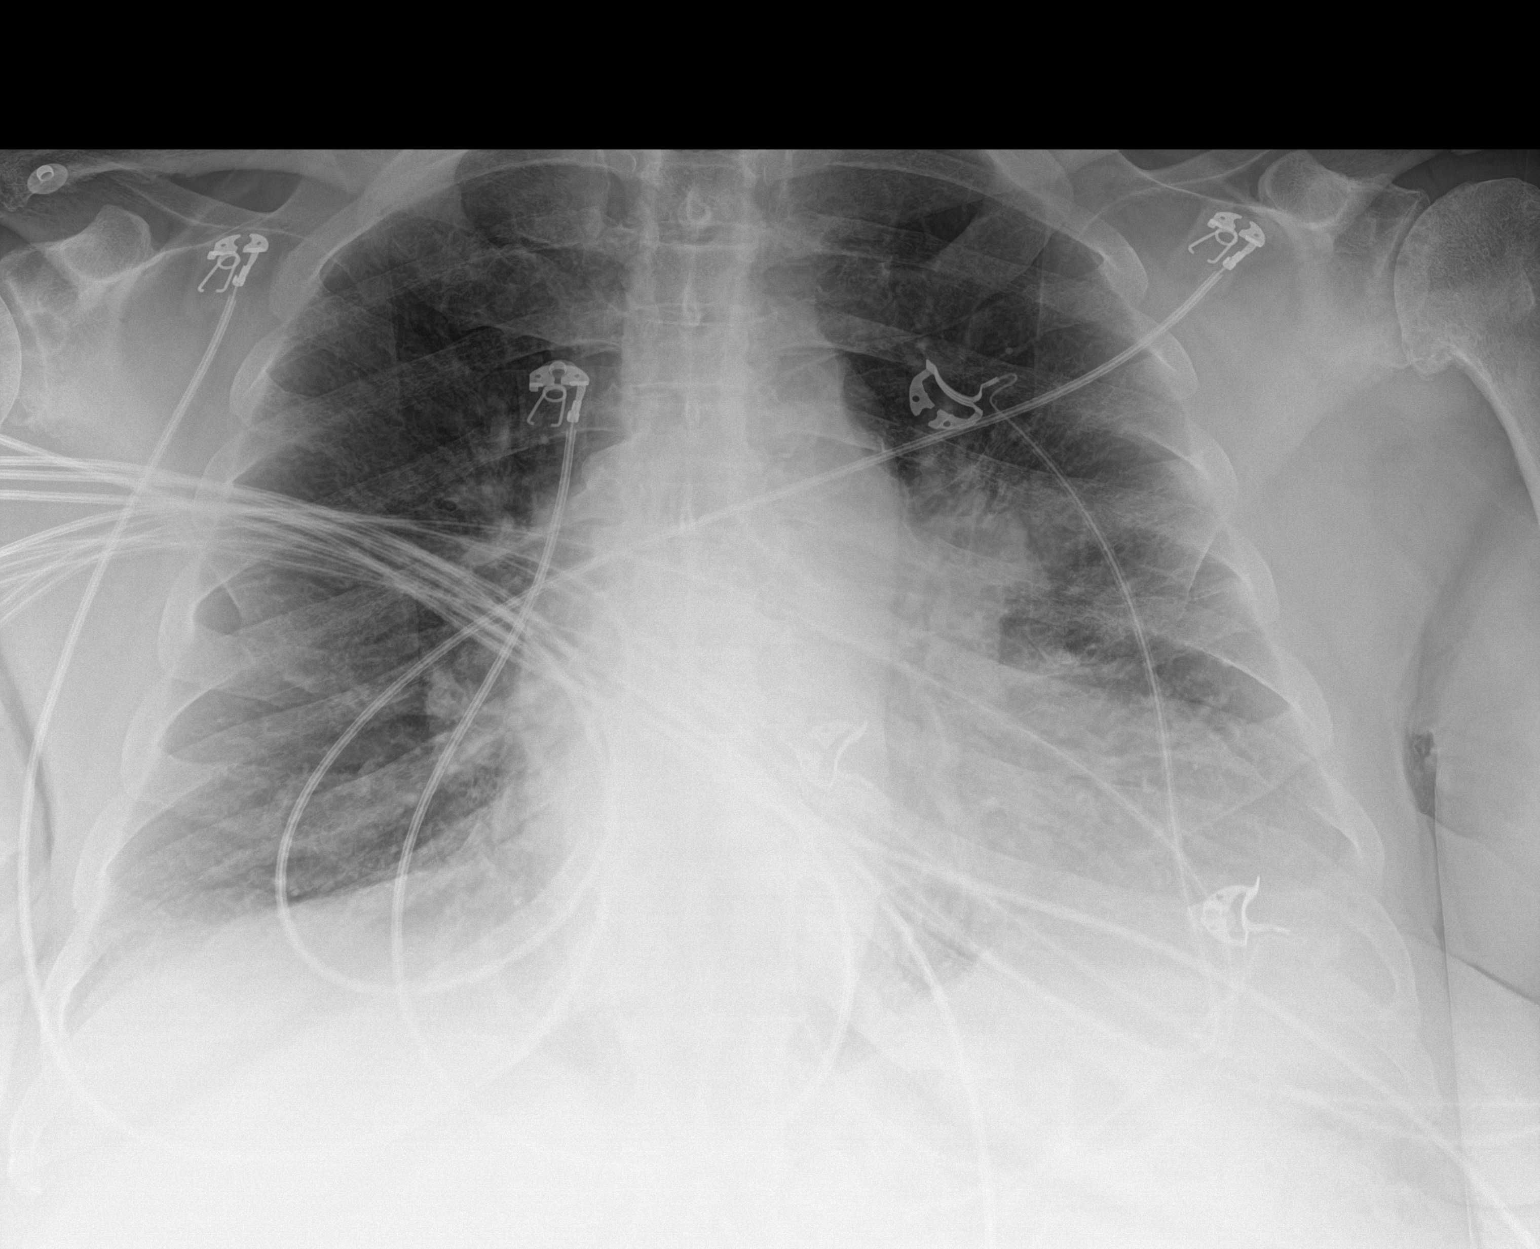

[1 of 1 positions shown; findings below may reference images not displayed]

FINDINGS: Mild areas of atelectasis are seen within the bilateral lung bases.
There is no evidence of a pleural effusion or pneumothorax. The
heart size and mediastinal contours are within normal limits.
Multilevel degenerative changes seen throughout the thoracic spine.
IMPRESSION: Mild bibasilar atelectasis.

## 2022-03-21 NOTE — Telephone Encounter (Signed)
Called pt. He states [facility] still has not contacted him about covering [place]. I faxed them information 3 months ago requesting coverage, and again 1 month ago after they didn't reach out to pt at all. They still have not. Pt states he will call them today. He is tolerating rechallenge of low dose rosuvastatin '5mg'$  well. Advised him we can check his cholesterol in office if he'd like and try titrating the dose or adding on ezetimibe if the [place] continues to deny coverage/not follow up with pt regarding request for Repatha coverage they've been sent multiple times. ?

## 2022-05-09 DIAGNOSIS — R519 Headache, unspecified: Secondary | ICD-10-CM | POA: Diagnosis not present

## 2022-05-09 DIAGNOSIS — R9082 White matter disease, unspecified: Secondary | ICD-10-CM | POA: Diagnosis not present

## 2022-05-17 DIAGNOSIS — J310 Chronic rhinitis: Secondary | ICD-10-CM | POA: Diagnosis not present

## 2022-05-30 DIAGNOSIS — I129 Hypertensive chronic kidney disease with stage 1 through stage 4 chronic kidney disease, or unspecified chronic kidney disease: Secondary | ICD-10-CM | POA: Diagnosis not present

## 2022-05-30 DIAGNOSIS — I739 Peripheral vascular disease, unspecified: Secondary | ICD-10-CM | POA: Diagnosis not present

## 2022-05-30 DIAGNOSIS — E1122 Type 2 diabetes mellitus with diabetic chronic kidney disease: Secondary | ICD-10-CM | POA: Diagnosis not present

## 2022-05-30 DIAGNOSIS — N183 Chronic kidney disease, stage 3 unspecified: Secondary | ICD-10-CM | POA: Diagnosis not present

## 2022-06-04 DIAGNOSIS — K118 Other diseases of salivary glands: Secondary | ICD-10-CM | POA: Diagnosis not present

## 2022-06-15 DIAGNOSIS — N390 Urinary tract infection, site not specified: Secondary | ICD-10-CM | POA: Diagnosis not present

## 2022-06-15 DIAGNOSIS — N1831 Chronic kidney disease, stage 3a: Secondary | ICD-10-CM | POA: Diagnosis not present

## 2022-06-18 DIAGNOSIS — J310 Chronic rhinitis: Secondary | ICD-10-CM | POA: Diagnosis not present

## 2022-07-18 DIAGNOSIS — Z794 Long term (current) use of insulin: Secondary | ICD-10-CM | POA: Diagnosis not present

## 2022-07-18 DIAGNOSIS — K118 Other diseases of salivary glands: Secondary | ICD-10-CM | POA: Diagnosis not present

## 2022-07-18 DIAGNOSIS — I739 Peripheral vascular disease, unspecified: Secondary | ICD-10-CM | POA: Diagnosis not present

## 2022-07-18 DIAGNOSIS — I10 Essential (primary) hypertension: Secondary | ICD-10-CM | POA: Diagnosis not present

## 2022-07-18 DIAGNOSIS — I2510 Atherosclerotic heart disease of native coronary artery without angina pectoris: Secondary | ICD-10-CM | POA: Diagnosis not present

## 2022-07-18 DIAGNOSIS — E119 Type 2 diabetes mellitus without complications: Secondary | ICD-10-CM | POA: Diagnosis not present

## 2022-07-18 DIAGNOSIS — N183 Chronic kidney disease, stage 3 unspecified: Secondary | ICD-10-CM | POA: Diagnosis not present

## 2022-07-18 DIAGNOSIS — G4733 Obstructive sleep apnea (adult) (pediatric): Secondary | ICD-10-CM | POA: Diagnosis not present

## 2022-07-18 DIAGNOSIS — K219 Gastro-esophageal reflux disease without esophagitis: Secondary | ICD-10-CM | POA: Diagnosis not present

## 2022-07-18 DIAGNOSIS — Z01818 Encounter for other preprocedural examination: Secondary | ICD-10-CM | POA: Diagnosis not present

## 2022-08-01 DIAGNOSIS — G4733 Obstructive sleep apnea (adult) (pediatric): Secondary | ICD-10-CM | POA: Diagnosis not present

## 2022-08-01 DIAGNOSIS — K219 Gastro-esophageal reflux disease without esophagitis: Secondary | ICD-10-CM | POA: Diagnosis not present

## 2022-08-01 DIAGNOSIS — E1151 Type 2 diabetes mellitus with diabetic peripheral angiopathy without gangrene: Secondary | ICD-10-CM | POA: Diagnosis not present

## 2022-08-01 DIAGNOSIS — E1140 Type 2 diabetes mellitus with diabetic neuropathy, unspecified: Secondary | ICD-10-CM | POA: Diagnosis not present

## 2022-08-01 DIAGNOSIS — Z87891 Personal history of nicotine dependence: Secondary | ICD-10-CM | POA: Diagnosis not present

## 2022-08-01 DIAGNOSIS — E1142 Type 2 diabetes mellitus with diabetic polyneuropathy: Secondary | ICD-10-CM | POA: Diagnosis not present

## 2022-08-01 DIAGNOSIS — E1122 Type 2 diabetes mellitus with diabetic chronic kidney disease: Secondary | ICD-10-CM | POA: Diagnosis not present

## 2022-08-01 DIAGNOSIS — I129 Hypertensive chronic kidney disease with stage 1 through stage 4 chronic kidney disease, or unspecified chronic kidney disease: Secondary | ICD-10-CM | POA: Diagnosis not present

## 2022-08-01 DIAGNOSIS — Z79899 Other long term (current) drug therapy: Secondary | ICD-10-CM | POA: Diagnosis not present

## 2022-08-01 DIAGNOSIS — Z794 Long term (current) use of insulin: Secondary | ICD-10-CM | POA: Diagnosis not present

## 2022-08-01 DIAGNOSIS — I2510 Atherosclerotic heart disease of native coronary artery without angina pectoris: Secondary | ICD-10-CM | POA: Diagnosis not present

## 2022-08-01 DIAGNOSIS — G8929 Other chronic pain: Secondary | ICD-10-CM | POA: Diagnosis not present

## 2022-08-01 DIAGNOSIS — R54 Age-related physical debility: Secondary | ICD-10-CM | POA: Diagnosis not present

## 2022-08-01 DIAGNOSIS — Z7984 Long term (current) use of oral hypoglycemic drugs: Secondary | ICD-10-CM | POA: Diagnosis not present

## 2022-08-01 DIAGNOSIS — D649 Anemia, unspecified: Secondary | ICD-10-CM | POA: Diagnosis not present

## 2022-08-01 DIAGNOSIS — L0211 Cutaneous abscess of neck: Secondary | ICD-10-CM | POA: Diagnosis not present

## 2022-08-01 DIAGNOSIS — D119 Benign neoplasm of major salivary gland, unspecified: Secondary | ICD-10-CM | POA: Diagnosis not present

## 2022-08-01 DIAGNOSIS — M1990 Unspecified osteoarthritis, unspecified site: Secondary | ICD-10-CM | POA: Diagnosis not present

## 2022-08-01 DIAGNOSIS — N183 Chronic kidney disease, stage 3 unspecified: Secondary | ICD-10-CM | POA: Diagnosis not present

## 2022-08-01 DIAGNOSIS — D110 Benign neoplasm of parotid gland: Secondary | ICD-10-CM | POA: Diagnosis not present

## 2022-08-01 DIAGNOSIS — Z7982 Long term (current) use of aspirin: Secondary | ICD-10-CM | POA: Diagnosis not present

## 2022-08-02 DIAGNOSIS — K219 Gastro-esophageal reflux disease without esophagitis: Secondary | ICD-10-CM | POA: Diagnosis not present

## 2022-08-02 DIAGNOSIS — Z7982 Long term (current) use of aspirin: Secondary | ICD-10-CM | POA: Diagnosis not present

## 2022-08-02 DIAGNOSIS — I2510 Atherosclerotic heart disease of native coronary artery without angina pectoris: Secondary | ICD-10-CM | POA: Diagnosis not present

## 2022-08-02 DIAGNOSIS — E1122 Type 2 diabetes mellitus with diabetic chronic kidney disease: Secondary | ICD-10-CM | POA: Diagnosis not present

## 2022-08-02 DIAGNOSIS — D110 Benign neoplasm of parotid gland: Secondary | ICD-10-CM | POA: Diagnosis not present

## 2022-08-02 DIAGNOSIS — Z794 Long term (current) use of insulin: Secondary | ICD-10-CM | POA: Diagnosis not present

## 2022-08-02 DIAGNOSIS — G8929 Other chronic pain: Secondary | ICD-10-CM | POA: Diagnosis not present

## 2022-08-02 DIAGNOSIS — Z79899 Other long term (current) drug therapy: Secondary | ICD-10-CM | POA: Diagnosis not present

## 2022-08-02 DIAGNOSIS — R54 Age-related physical debility: Secondary | ICD-10-CM | POA: Diagnosis not present

## 2022-08-02 DIAGNOSIS — E1151 Type 2 diabetes mellitus with diabetic peripheral angiopathy without gangrene: Secondary | ICD-10-CM | POA: Diagnosis not present

## 2022-08-02 DIAGNOSIS — Z87891 Personal history of nicotine dependence: Secondary | ICD-10-CM | POA: Diagnosis not present

## 2022-08-02 DIAGNOSIS — N183 Chronic kidney disease, stage 3 unspecified: Secondary | ICD-10-CM | POA: Diagnosis not present

## 2022-08-02 DIAGNOSIS — G4733 Obstructive sleep apnea (adult) (pediatric): Secondary | ICD-10-CM | POA: Diagnosis not present

## 2022-08-02 DIAGNOSIS — E1142 Type 2 diabetes mellitus with diabetic polyneuropathy: Secondary | ICD-10-CM | POA: Diagnosis not present

## 2022-08-02 DIAGNOSIS — M1990 Unspecified osteoarthritis, unspecified site: Secondary | ICD-10-CM | POA: Diagnosis not present

## 2022-08-02 DIAGNOSIS — I129 Hypertensive chronic kidney disease with stage 1 through stage 4 chronic kidney disease, or unspecified chronic kidney disease: Secondary | ICD-10-CM | POA: Diagnosis not present

## 2022-08-03 DIAGNOSIS — E1142 Type 2 diabetes mellitus with diabetic polyneuropathy: Secondary | ICD-10-CM | POA: Diagnosis not present

## 2022-08-03 DIAGNOSIS — M1990 Unspecified osteoarthritis, unspecified site: Secondary | ICD-10-CM | POA: Diagnosis not present

## 2022-08-03 DIAGNOSIS — R54 Age-related physical debility: Secondary | ICD-10-CM | POA: Diagnosis not present

## 2022-08-03 DIAGNOSIS — E1122 Type 2 diabetes mellitus with diabetic chronic kidney disease: Secondary | ICD-10-CM | POA: Diagnosis not present

## 2022-08-03 DIAGNOSIS — Z794 Long term (current) use of insulin: Secondary | ICD-10-CM | POA: Diagnosis not present

## 2022-08-03 DIAGNOSIS — Z79899 Other long term (current) drug therapy: Secondary | ICD-10-CM | POA: Diagnosis not present

## 2022-08-03 DIAGNOSIS — G8929 Other chronic pain: Secondary | ICD-10-CM | POA: Diagnosis not present

## 2022-08-03 DIAGNOSIS — I129 Hypertensive chronic kidney disease with stage 1 through stage 4 chronic kidney disease, or unspecified chronic kidney disease: Secondary | ICD-10-CM | POA: Diagnosis not present

## 2022-08-03 DIAGNOSIS — G4733 Obstructive sleep apnea (adult) (pediatric): Secondary | ICD-10-CM | POA: Diagnosis not present

## 2022-08-03 DIAGNOSIS — E1151 Type 2 diabetes mellitus with diabetic peripheral angiopathy without gangrene: Secondary | ICD-10-CM | POA: Diagnosis not present

## 2022-08-03 DIAGNOSIS — D110 Benign neoplasm of parotid gland: Secondary | ICD-10-CM | POA: Diagnosis not present

## 2022-08-03 DIAGNOSIS — K219 Gastro-esophageal reflux disease without esophagitis: Secondary | ICD-10-CM | POA: Diagnosis not present

## 2022-08-03 DIAGNOSIS — I2510 Atherosclerotic heart disease of native coronary artery without angina pectoris: Secondary | ICD-10-CM | POA: Diagnosis not present

## 2022-08-03 DIAGNOSIS — N183 Chronic kidney disease, stage 3 unspecified: Secondary | ICD-10-CM | POA: Diagnosis not present

## 2022-08-03 DIAGNOSIS — Z87891 Personal history of nicotine dependence: Secondary | ICD-10-CM | POA: Diagnosis not present

## 2022-08-03 DIAGNOSIS — Z7982 Long term (current) use of aspirin: Secondary | ICD-10-CM | POA: Diagnosis not present

## 2022-08-09 DIAGNOSIS — Z4802 Encounter for removal of sutures: Secondary | ICD-10-CM | POA: Diagnosis not present

## 2022-08-09 DIAGNOSIS — D119 Benign neoplasm of major salivary gland, unspecified: Secondary | ICD-10-CM | POA: Diagnosis not present

## 2022-08-09 DIAGNOSIS — L0211 Cutaneous abscess of neck: Secondary | ICD-10-CM | POA: Diagnosis not present

## 2022-08-17 DIAGNOSIS — L0211 Cutaneous abscess of neck: Secondary | ICD-10-CM | POA: Diagnosis not present

## 2022-09-14 DIAGNOSIS — L0211 Cutaneous abscess of neck: Secondary | ICD-10-CM | POA: Diagnosis not present

## 2022-09-14 DIAGNOSIS — L720 Epidermal cyst: Secondary | ICD-10-CM | POA: Diagnosis not present

## 2022-09-17 NOTE — Progress Notes (Unsigned)
Office Visit    Patient Name: Joel Rivera. Date of Encounter: 09/17/2022  Primary Care Provider:  Merrilee Seashore, MD Primary Cardiologist:  Sinclair Grooms, MD Primary Electrophysiologist: None  Chief Complaint    Joel Rivera. is a 76 y.o. male with PMH of nonobstructive CAD s/p LHC 2003, HTN, DM II, CKD stage III, PAD s/p left and right staged SFA interventions, OSA (on CPAP), HFpEF, PTSD, HLD who presents today for overdue 81-monthfollow-up of coronary artery disease.  Past Medical History    Past Medical History:  Diagnosis Date   Agent orange exposure    Allergies    Arthritis    "neck; left shoulder" (10/25/2016)   CAD (coronary artery disease)    Carotid artery disease (HCC)    CHF (congestive heart failure) (HCC)    Chronic post-traumatic stress disorder (PTSD) 03/16/2015   Chronic sinus infection    CKD (chronic kidney disease), stage III ([facility]    Claustrophobia 03/16/2015   Depression    Diabetic neuropathy (HCC)    Former tobacco use    GERD (gastroesophageal reflux disease)    Hyperlipidemia    Hypertension    Morbid obesity (HCC)    OSA (obstructive sleep apnea)    "been checked out several times; each time never got any treatment ordered" (10/25/2016)   PAD (peripheral artery disease) ([facility]    a. Diagnostic PV Angio 08/2016 -> s/p RLE PTA in 09/2016. b. s/p LLE PTA in 10/2016.   PTSD (post-traumatic stress disorder)    "I'm a Veteran; 100% disability"   Recurrent boils    abceses   Type II diabetes mellitus ([facility] [place]    Past Surgical History:  Procedure Laterality Date   ABCESS DRAINAGE     "fluid-filled boils removed off my upper back, left leg; and back of my neck"   ABDOMINAL HERNIA REPAIR  ~ 2008   CATARACT EXTRACTION W/ INTRAOCULAR LENS  IMPLANT, BILATERAL Bilateral    COLONOSCOPY W/ BIOPSIES AND POLYPECTOMY     "was gettin colonoscopy q 5 yrs; 11 polyps found in Dec. 2016; going back in Feb 2018"   CORONARY ANGIOPLASTY  ~ 2004   CYST  EXCISION Left 2008   inguinal area   CYST EXCISION N/A 03/23/2021   Procedure: SEBACEOUS CYST REMOVAL FROM BACK;  Surgeon: CErroll Luna MD;  [facility]  Service: General;  Laterality: N/A;   HERNIA REPAIR     "stomach; it's coming back again" (10/25/2016)   PERIPHERAL VASCULAR CATHETERIZATION N/A 08/27/2016   Procedure: Lower Extremity Angiography;  Surgeon: JLorretta Harp MD;  [facility];  Service: Cardiovascular;  Laterality: N/A;   PERIPHERAL VASCULAR CATHETERIZATION N/A 09/17/2016   Procedure: Lower Extremity Angiography;  Surgeon: JLorretta Harp MD;  [facility];  Service: Cardiovascular;  Laterality: N/A;   PERIPHERAL VASCULAR CATHETERIZATION Right 09/17/2016   Procedure: Peripheral Vascular Atherectomy;  Surgeon: JLorretta Harp MD;  [facility];  Service: Cardiovascular;  Laterality: Right;  Common Femoral    PERIPHERAL VASCULAR CATHETERIZATION Left 10/25/2016   Procedure: Peripheral Vascular Atherectomy;  Surgeon: JLorretta Harp MD;  [facility];  Service: Cardiovascular;  Laterality: Left;  SFA    Allergies  Allergies  Allergen Reactions   Fluticasone    Atorvastatin Other (See Comments)    Muscle pain/weaknesses (Myalgia)   Fosinopril Other (See Comments)   Pravastatin Other (See Comments)    Muscle  pain/weaknesses (Myalgia)   Simvastatin Other (See Comments)    Muscle pain/weaknesses (Myalgia)   Terazosin Other (See Comments)    Unknown reaction.    History of Present Illness    Joel Rivera.  is a 76 year old male with the above mention past medical history who presents today for 63-monthfollow-up of coronary artery disease and PAD.  Mr. HFarrawas recently seen by Dr. STamala Julianin 2016 for intermittent claudication.  He had a previous left heart catheterization completed in 2003 that demonstrated moderate CAD and normal systolic function.  2D echo was completed  revealed LVH with preserved systolic function and carotid Dopplers completed that showed 50% obstruction bilaterally.  He underwent Lexiscan Myoview on 07/2016 due to complaint of possible chest pain that revealed low risk study with no ischemia.  He underwent LAD angiogram in 2016 and 2017 with diamondback orbital atherectomy to the right common femoral artery and SFA with DES placed in the right SFA and lower extremity runoff.  He had repeat lower extremity arterial Doppler studies performed 2021 that revealed moderate distal RS FA stenosis with no claudication reported by patient.  He was admitted 10/2021 following episode of syncope that occurred while shopping.  2D echo was performed and revealed no RWMA with mild LVH and EF of 75% hyperdynamic.  Troponins peaked at 25 however ACS was ruled out and following consultation patient was found to have orthostasis and chlorthalidone was discontinued with reduction in losartan.  He was last seen on 11/2021 for follow-up.  During visit patient denied any dizziness or lightheadedness.  No medication changes were made and patient was encouraged to continue to check blood pressures at home.  He had discontinued his rosuvastatin and was referred to lipid clinic for consideration of Repatha.  [facility].  He was rechallenged on rosuvastatin 5 mg and was tolerating well.  Plan for recheck of lipids and possible addition of ezetimibe if coverage continue to be denied.  Since last being seen in the office patient reports***.  Patient denies chest pain, palpitations, dyspnea, PND, orthopnea, nausea, vomiting, dizziness, syncope, edema, weight gain, or early satiety.     ***Notes:  Home Medications    Current Outpatient Medications  Medication Sig Dispense Refill   acetaminophen (TYLENOL) 325 MG tablet Take 650 mg by mouth every 6 (six) hours as needed (for pain.).     aspirin EC 81 MG tablet  Take 81 mg by mouth daily.     carvedilol (COREG) 25 MG tablet Take 1 tablet (25 mg total) by mouth 2 (two) times daily with a meal. 60 tablet 12   cholecalciferol (VITAMIN D) 1000 UNITS tablet Take 1,000 Units by mouth daily.     citalopram (CELEXA) 20 MG tablet Take 10 mg by mouth daily.     diclofenac Sodium (VOLTAREN) 1 % GEL Apply 4 g topically 4 (four) times daily. 100 g 0   fluticasone (FLONASE) 50 MCG/ACT nasal spray Place 2 sprays into both nostrils daily. 16 g 0   gabapentin (NEURONTIN) 300 MG capsule Take 300 mg by mouth 2 (two) times daily.     guaiFENesin (MUCINEX) 600 MG 12 hr tablet Take 600 mg by mouth daily as needed for cough or to loosen phlegm.     HYDROcodone-acetaminophen (NORCO/VICODIN) 5-325 MG tablet Take 1 tablet by mouth every 6 (six) hours as needed for moderate pain. 15 tablet 0   Insulin Glargine-Lixisenatide 100-33  UNT-MCG/ML SOPN Inject 43 Units into the skin daily.     ipratropium (ATROVENT) 0.06 % nasal spray as needed.     JARDIANCE 25 MG TABS tablet Take 25 mg by mouth daily.      linaclotide (LINZESS) 145 MCG CAPS capsule as needed.     losartan (COZAAR) 100 MG tablet Take 0.5 tablets (50 mg total) by mouth daily. 30 tablet 12   pantoprazole (PROTONIX) 40 MG tablet Take 40 mg by mouth daily.     polyethylene glycol powder (GLYCOLAX/MIRALAX) 17 GM/SCOOP powder Take 17 g by mouth daily. 255 g 0   rosuvastatin (CRESTOR) 5 MG tablet Take 1 tablet (5 mg total) by mouth daily. 30 tablet 11   sildenafil (VIAGRA) 100 MG tablet Take 100 mg by mouth as needed for erectile dysfunction.     sodium chloride (OCEAN) 0.65 % SOLN nasal spray Place 1 spray into both nostrils 3 (three) times daily as needed for congestion.     No current facility-administered medications for this visit.     Review of Systems  Please see the history of present illness.    (+)*** (+)***  All other systems reviewed and are otherwise negative except as noted above.  Physical Exam    Wt  Readings from Last 3 Encounters:  12/08/21 239 lb (108.4 kg)  11/01/21 243 lb (110.2 kg)  08/07/21 243 lb 3.2 oz (110.3 kg)   DJ:SHFWY were no vitals filed for this visit.,There is no height or weight on file to calculate BMI.  Constitutional:      Appearance: Healthy appearance. Not in distress.  Neck:     Vascular: JVD normal.  Pulmonary:     Effort: Pulmonary effort is normal.     Breath sounds: No wheezing. No rales. Diminished in the bases Cardiovascular:     Normal rate. Regular rhythm. Normal S1. Normal S2.      Murmurs: There is no murmur.  Edema:    Peripheral edema absent.  Abdominal:     Palpations: Abdomen is soft non tender. There is no hepatomegaly.  Skin:    General: Skin is warm and dry.  Neurological:     General: No focal deficit present.     Mental Status: Alert and oriented to person, place and time.     Cranial Nerves: Cranial nerves are intact.  EKG/LABS/Other Studies Reviewed    ECG personally reviewed by me today - ***  Risk Assessment/Calculations:   {Does this patient have ATRIAL FIBRILLATION?[phone]}        Lab Results  Component Value Date   WBC 9.5 11/02/2021   HGB 12.8 (L) 11/02/2021   HCT 39.0 11/02/2021   MCV 90.9 11/02/2021   PLT 177 11/02/2021   Lab Results  Component Value Date   CREATININE 1.42 (H) 12/08/2021   BUN 18 12/08/2021   NA 139 12/08/2021   K 4.6 12/08/2021   CL 102 12/08/2021   CO2 23 12/08/2021   Lab Results  Component Value Date   ALT 15 11/01/2021   AST 19 11/01/2021   ALKPHOS 89 11/01/2021   BILITOT 1.3 (H) 11/01/2021   Lab Results  Component Value Date   CHOL 218 (H) 01/03/2022   HDL 38 (L) 01/03/2022   LDLCALC 161 (H) 01/03/2022   TRIG 107 01/03/2022   CHOLHDL 5.7 (H) 01/03/2022    Lab Results  Component Value Date   HGBA1C 7.6 (H) 11/02/2021    Assessment & Plan    1.  Coronary artery  disease: -Lexiscan Myoview completed 2017 showing low risk and no evidence of ischemia -Patient  currently*** -Continue GDMT with ASA 81 mg, carvedilol 25 mg, Jardiance 25 mg, and Crestor 5 mg  2.  HFpEF: -Last 2D echo completed 10/2021 with hyperdynamic EF of 75%, no RWMA, mild concentric LVH, no MVR, or AVR -Today patient is***on exam -Current GDMT consist of Jardiance 25 mg, losartan 50 mg, and carvedilol 25 mg twice daily  3.  Peripheral artery disease: - s/p left and right staged SFA interventions -Patient currently followed by Dr.Berry  4.  Hyperlipidemia: -Patient reports*** -Patient's last LDL was*** -He is currently on Crestor 5 mg  5.  Essential hypertension: -Blood pressure today was*** -Continue Coreg 25 mg twice daily, losartan 50 mg,      Disposition: Follow-up with Belva Crome III, MD or APP in *** months {Are you ordering a CV Procedure (e.g. stress test, cath, DCCV, TEE, etc)?   Press F2        :[id number]}   Medication Adjustments/Labs and Tests Ordered: Current medicines are reviewed at length with the patient today.  Concerns regarding medicines are outlined above.   Signed, Mable Fill, Marissa Nestle, NP 09/17/2022, 9:42 AM [facility]  Note:  This document was prepared using Dragon voice recognition software and may include unintentional dictation errors.

## 2022-09-19 ENCOUNTER — Encounter: Payer: Self-pay | Admitting: Nurse Practitioner

## 2022-09-19 ENCOUNTER — Ambulatory Visit: Payer: Medicare Other | Attending: Nurse Practitioner | Admitting: Nurse Practitioner

## 2022-09-19 VITALS — BP 148/78 | HR 67 | Ht 68.0 in | Wt 236.0 lb

## 2022-09-19 DIAGNOSIS — I10 Essential (primary) hypertension: Secondary | ICD-10-CM | POA: Diagnosis not present

## 2022-09-19 DIAGNOSIS — E782 Mixed hyperlipidemia: Secondary | ICD-10-CM

## 2022-09-19 DIAGNOSIS — I2510 Atherosclerotic heart disease of native coronary artery without angina pectoris: Secondary | ICD-10-CM | POA: Diagnosis not present

## 2022-09-19 DIAGNOSIS — I5040 Unspecified combined systolic (congestive) and diastolic (congestive) heart failure: Secondary | ICD-10-CM | POA: Diagnosis not present

## 2022-09-19 DIAGNOSIS — I739 Peripheral vascular disease, unspecified: Secondary | ICD-10-CM | POA: Diagnosis not present

## 2022-09-19 NOTE — Patient Instructions (Signed)
Medication Instructions:  Your physician recommends that you continue on your current medications as directed. Please refer to the Current Medication list given to you today. *If you need a refill on your cardiac medications before your next appointment, please call your pharmacy*   Lab Work: None Ordered   Testing/Procedures: None Ordered   Follow-Up: At [facility], you and your health needs are our priority.  As part of our continuing mission to provide you with exceptional heart care, we have created designated Provider Care Teams.  These Care Teams include your primary Cardiologist (physician) and Advanced Practice Providers (APPs -  Physician Assistants and Nurse Practitioners) who all work together to provide you with the care you need, when you need it.  We recommend signing up for the patient portal called "MyChart".  Sign up information is provided on this After Visit Summary.  MyChart is used to connect with patients for Virtual Visits (Telemedicine).  Patients are able to view lab/test results, encounter notes, upcoming appointments, etc.  Non-urgent messages can be sent to your provider as well.   To learn more about what you can do with MyChart, go to [email or web address].    Your next appointment:   6 month(s)  The format for your next appointment:   In Person  Provider:   Ambrose Pancoast, NP        Other Instructions   Important Information About Sugar

## 2022-09-21 DIAGNOSIS — Z872 Personal history of diseases of the skin and subcutaneous tissue: Secondary | ICD-10-CM | POA: Diagnosis not present

## 2022-09-21 DIAGNOSIS — L720 Epidermal cyst: Secondary | ICD-10-CM | POA: Diagnosis not present

## 2022-09-21 DIAGNOSIS — Z9889 Other specified postprocedural states: Secondary | ICD-10-CM | POA: Diagnosis not present

## 2022-10-12 ENCOUNTER — Ambulatory Visit (HOSPITAL_COMMUNITY)
Admission: RE | Admit: 2022-10-12 | Discharge: 2022-10-12 | Disposition: A | Discharge status: Home / Self Care | Payer: Medicare Other | Source: Ambulatory Visit | Attending: Cardiovascular Disease | Admitting: Cardiovascular Disease

## 2022-10-12 DIAGNOSIS — I739 Peripheral vascular disease, unspecified: Secondary | ICD-10-CM | POA: Diagnosis not present

## 2022-10-15 DIAGNOSIS — R519 Headache, unspecified: Secondary | ICD-10-CM | POA: Diagnosis not present

## 2022-10-15 DIAGNOSIS — D119 Benign neoplasm of major salivary gland, unspecified: Secondary | ICD-10-CM | POA: Diagnosis not present

## 2023-01-15 DIAGNOSIS — N183 Chronic kidney disease, stage 3 unspecified: Secondary | ICD-10-CM | POA: Diagnosis not present

## 2023-01-15 DIAGNOSIS — N390 Urinary tract infection, site not specified: Secondary | ICD-10-CM | POA: Diagnosis not present

## 2023-01-15 DIAGNOSIS — I739 Peripheral vascular disease, unspecified: Secondary | ICD-10-CM | POA: Diagnosis not present

## 2023-01-15 DIAGNOSIS — I129 Hypertensive chronic kidney disease with stage 1 through stage 4 chronic kidney disease, or unspecified chronic kidney disease: Secondary | ICD-10-CM | POA: Diagnosis not present

## 2023-01-15 DIAGNOSIS — E1122 Type 2 diabetes mellitus with diabetic chronic kidney disease: Secondary | ICD-10-CM | POA: Diagnosis not present

## 2023-04-15 ENCOUNTER — Ambulatory Visit (HOSPITAL_COMMUNITY)
Admission: EM | Admit: 2023-04-15 | Discharge: 2023-04-15 | Disposition: A | Discharge status: Home / Self Care | Payer: Medicare Other | Attending: Internal Medicine | Admitting: Internal Medicine

## 2023-04-15 ENCOUNTER — Encounter (HOSPITAL_COMMUNITY): Payer: Self-pay | Admitting: Emergency Medicine

## 2023-04-15 DIAGNOSIS — Z794 Long term (current) use of insulin: Secondary | ICD-10-CM | POA: Diagnosis not present

## 2023-04-15 DIAGNOSIS — E1142 Type 2 diabetes mellitus with diabetic polyneuropathy: Secondary | ICD-10-CM

## 2023-04-15 DIAGNOSIS — R202 Paresthesia of skin: Secondary | ICD-10-CM | POA: Insufficient documentation

## 2023-04-15 LAB — BASIC METABOLIC PANEL
Anion gap: 10 (ref 5–15)
BUN: 16 mg/dL (ref 8–23)
CO2: 22 mmol/L (ref 22–32)
Calcium: 9 mg/dL (ref 8.9–10.3)
Chloride: 104 mmol/L (ref 98–111)
Creatinine, Ser: 1.2 mg/dL (ref 0.61–1.24)
GFR, Estimated: >60 mL/min (ref >60)
Glucose, Bld: 93 mg/dL (ref 70–99)
Potassium: 4 mmol/L (ref 3.5–5.1)
Sodium: 136 mmol/L (ref 135–145)

## 2023-04-15 LAB — CBC
HCT: 40.2 % (ref 39.0–52.0)
Hemoglobin: 13.2 g/dL (ref 13.0–17.0)
MCH: 29.7 pg (ref 26.0–34.0)
MCHC: 32.8 g/dL (ref 30.0–36.0)
MCV: 90.3 fL (ref 80.0–100.0)
Platelets: 210 10*3/uL (ref 150–400)
RBC: 4.45 MIL/uL (ref 4.22–5.81)
RDW: 12.8 % (ref 11.5–15.5)
WBC: 8.1 10*3/uL (ref 4.0–10.5)
nRBC: 0 % (ref 0.0–0.2)

## 2023-04-15 NOTE — ED Triage Notes (Signed)
Pt reports right arm and hand numbness and tightness that is intermittent for several weeks. Denies falls or injury.

## 2023-04-15 NOTE — Discharge Instructions (Addendum)
Your right hand pain and tingling is likely due to your diabetic neuropathy.  Take gabapentin 3 times daily.  Wear the right wrist brace as needed to stabilize the nerves in your wrist and help with your symptoms.  Please schedule a follow-up appointment with your primary care provider for ongoing evaluation of your symptoms.  Return to urgent care as needed.

## 2023-04-15 NOTE — ED Provider Notes (Signed)
[facility]    CSN: [id number] Arrival date & time: 04/15/23  1058      History   Chief Complaint Chief Complaint  Patient presents with   Numbness    HPI Joel Rivera. is a 77 y.o. male.   Patient with past medical history of diabetic neuropathy, type 2 diabetes with insulin use, CKD stage III, claudication, PAD, and hypertensive heart disease presents to urgent care for evaluation of pain and intermittent numbness to the tips of the fingers of the right hand that started several weeks ago. He wonders if this could be arthritis versus a problem with his potassium. No recent traumas/injuries to the right hand/right wrist. Denies temperature change to the right upper extremity/right hand. No overlying rash to area of pain/intermittent numbness. No weakness to the extremity. He has a history of diabetic neuropathy and currently takes gabapentin twice daily for lower extremity neuropathy. States sugars have been within normal range recently. Takes all of his medications for DM2 as prescribed (ozempic and insulin).  He has not attempted use of any over-the-counter medications to help with symptoms before coming to urgent care.     Past Medical History:  Diagnosis Date   Agent orange exposure    Allergies    Arthritis    "neck; left shoulder" (10/25/2016)   CAD (coronary artery disease)    Carotid artery disease (HCC)    CHF (congestive heart failure) (HCC)    Chronic post-traumatic stress disorder (PTSD) 03/16/2015   Chronic sinus infection    CKD (chronic kidney disease), stage III (HCC)    Claustrophobia 03/16/2015   Depression    Diabetic neuropathy (HCC)    Former tobacco use    GERD (gastroesophageal reflux disease)    Hyperlipidemia    Hypertension    Morbid obesity (HCC)    OSA (obstructive sleep apnea)    "been checked out several times; each time never got any treatment ordered" (10/25/2016)   PAD (peripheral artery disease) (HCC)    a. Diagnostic PV Angio  08/2016 -> s/p RLE PTA in 09/2016. b. s/p LLE PTA in 10/2016.   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 2016; going back in Feb 2018"   CORONARY ANGIOPLASTY  ~ 2004   CYST EXCISION Left 2008   inguinal area   CYST EXCISION N/A 03/23/2021   Procedure: SEBACEOUS CYST REMOVAL FROM BACK;  Surgeon: Harriette Bouillon, MD;  Location:  [facility];  Service: General;  Laterality: N/A;   HERNIA REPAIR     "stomach; it's coming back again" (10/25/2016)   PERIPHERAL VASCULAR CATHETERIZATION N/A 08/27/2016   Procedure: Lower Extremity Angiography;  Surgeon: Runell Gess, MD;  Location: [facility] INVASIVE CV LAB;  Service: Cardiovascular;  Laterality: N/A;   PERIPHERAL VASCULAR CATHETERIZATION N/A 09/17/2016   Procedure: Lower Extremity Angiography;  Surgeon: Runell Gess, MD;  Location: [facility] INVASIVE CV LAB;  Service: Cardiovascular;  Laterality: N/A;   PERIPHERAL VASCULAR CATHETERIZATION Right 09/17/2016    Procedure: Peripheral Vascular Atherectomy;  Surgeon: Runell Gess, MD;  Location: [facility] INVASIVE CV LAB;  Service: Cardiovascular;  Laterality: Right;  Common Femoral    PERIPHERAL VASCULAR CATHETERIZATION Left 10/25/2016   Procedure: Peripheral Vascular Atherectomy;  Surgeon: Runell Gess, MD;  Location: [facility] INVASIVE CV LAB;  Service: Cardiovascular;  Laterality: Left;  SFA       Home Medications    Prior to Admission medications   Medication Sig Start Date End Date Taking? Authorizing Provider  acetaminophen (TYLENOL) 325 MG tablet Take 650 mg by mouth every 6 (six) hours as needed (for pain.).    [provider]  aspirin EC 81 MG tablet Take 81 mg by mouth daily.    [provider]  carvedilol (COREG) 25 MG tablet Take 1 tablet (25 mg total) by mouth 2 (two) times daily with a meal. 09/18/16   Little Ishikawa, NP  cholecalciferol (VITAMIN D) 1000 UNITS tablet Take 1,000 Units by mouth daily.    [provider]  citalopram (CELEXA) 20 MG tablet Take 10 mg by mouth daily.    [provider]  diclofenac Sodium (VOLTAREN) 1 % GEL Apply 4 g topically 4 (four) times daily. 07/12/20   Darr, Gerilyn Pilgrim, PA-C  fluticasone (FLONASE) 50 MCG/ACT nasal spray Place 2 sprays into both nostrils daily. 12/04/19   Eustace Moore, MD  gabapentin (NEURONTIN) 300 MG capsule Take 300 mg by mouth 2 (two) times daily. 06/17/20   [provider]  guaiFENesin (MUCINEX) 600 MG 12 hr tablet Take 600 mg by mouth daily as needed for cough or to loosen phlegm.    [provider]  HYDROcodone-acetaminophen (NORCO/VICODIN) 5-325 MG tablet Take 1 tablet by mouth every 6 (six) hours as needed for moderate pain. 03/23/21   Cornett, Maisie Fus, MD  Insulin Glargine-Lixisenatide 100-33 UNT-MCG/ML SOPN Inject 43 Units into the skin daily.    [provider]  ipratropium (ATROVENT) 0.06 % nasal spray as needed. 10/12/20   [provider]  JARDIANCE 25 MG TABS tablet  Take 25 mg by mouth daily.  09/15/14   [provider]  linaclotide Karlene Einstein) 145 MCG CAPS capsule as needed.    [provider]  losartan (COZAAR) 100 MG tablet Take 0.5 tablets (50 mg total) by mouth daily. 11/04/21   Burnadette Pop, MD  pantoprazole (PROTONIX) 40 MG tablet Take 40 mg by mouth daily.    [provider]  polyethylene glycol powder (GLYCOLAX/MIRALAX) 17 GM/SCOOP powder Take 17 g by mouth daily. 07/12/20   Darr, Gerilyn Pilgrim, PA-C  rosuvastatin (CRESTOR) 5 MG tablet Take 1 tablet (5 mg total) by mouth daily. 01/24/22   Lyn Records, MD  sildenafil (VIAGRA) 100 MG tablet Take 100 mg by mouth as needed for erectile dysfunction. 02/16/15   [provider]  sodium chloride (OCEAN) 0.65 % SOLN nasal spray Place 1 spray into both nostrils 3 (  three) times daily as needed for congestion.    [provider]    Family History Family History  Problem Relation Age of Onset   Urinary tract infection Mother    Pneumonia Mother    Aneurysm Father    Dementia Sister    Stroke Sister    Sarcoidosis Sister    Healthy Sister     Social History Social History   Tobacco Use   Smoking status: Former    Packs/day: 3.00    Years: 13.00    Additional pack years: 0.00    Total pack years: 39.00    Types: Cigarettes    Quit date: 1986    Years since quitting: 38.4   Smokeless tobacco: Never  Substance Use Topics   Alcohol use: Yes    Alcohol/week: 0.0 standard drinks of alcohol    Comment: 10/25/2016 "quit years ago"   Drug use: No     Allergies   Fluticasone, Atorvastatin, Fosinopril, Pravastatin, Simvastatin, and Terazosin   Review of Systems Review of Systems Per HPI  Physical Exam Triage Vital Signs ED Triage Vitals  Enc Vitals Group     BP 04/15/23 1238 127/71     Pulse Rate 04/15/23 1238 64     Resp 04/15/23 1238 17     Temp 04/15/23 1238 98.1 F (36.7 C)     Temp Source 04/15/23 1238 Oral     SpO2 04/15/23 1238 96 %      Weight --      Height --      Head Circumference --      Peak Flow --      Pain Score 04/15/23 1237 0     Pain Loc --      Pain Edu? --      Excl. in GC? --    No data found.  Updated Vital Signs BP 127/71 (BP Location: Left Arm)   Pulse 64   Temp 98.1 F (36.7 C) (Oral)   Resp 17   SpO2 96%   Visual Acuity Right Eye Distance:   Left Eye Distance:   Bilateral Distance:    Right Eye Near:   Left Eye Near:    Bilateral Near:     Physical Exam Vitals and nursing note reviewed.  Constitutional:      Appearance: He is not ill-appearing or toxic-appearing.  HENT:     Head: Normocephalic and atraumatic.     Right Ear: Hearing and external ear normal.     Left Ear: Hearing and external ear normal.     Nose: Nose normal.     Mouth/Throat:     Lips: Pink.  Eyes:     General: Lids are normal. Vision grossly intact. Gaze aligned appropriately.     Extraocular Movements: Extraocular movements intact.     Conjunctiva/sclera: Conjunctivae normal.  Cardiovascular:     Rate and Rhythm: Normal rate and regular rhythm.     Heart sounds: Normal heart sounds, S1 normal and S2 normal.  Pulmonary:     Effort: Pulmonary effort is normal. No respiratory distress.     Breath sounds: Normal breath sounds and air entry.  Musculoskeletal:     Right wrist: Normal.     Left wrist: Normal.     Right hand: Normal.     Left hand: Normal.     Cervical back: Neck supple.     Comments: Full sensation present to distal right hand.  Nontender to palpation of the joints of the  right hand.  5/5 grip strength to bilateral upper extremities.  +2 right radial pulse with less than 3 capillary refill to the distal right hand.  Phalen's test negative bilaterally.  Skin:    General: Skin is warm and dry.     Capillary Refill: Capillary refill takes less than 2 seconds.     Findings: No rash.  Neurological:     General: No focal deficit present.     Mental Status: He is alert and oriented to person,  place, and time. Mental status is at baseline.     Cranial Nerves: No dysarthria or facial asymmetry.  Psychiatric:        Mood and Affect: Mood normal.        Speech: Speech normal.        Behavior: Behavior normal.        Thought Content: Thought content normal.        Judgment: Judgment normal.      UC Treatments / Results  Labs (all labs ordered are listed, but only abnormal results are displayed) Labs Reviewed  CBC  BASIC METABOLIC PANEL    EKG   Radiology No results found.  Procedures Procedures (including critical care time)  Medications Ordered in UC Medications - No data to display  Initial Impression / Assessment and Plan / UC Course  I have reviewed the triage vital signs and the nursing notes.  Pertinent labs & imaging results that were available during my care of the patient were reviewed by me and considered in my medical decision making (see chart for details).   1.  Diabetic polyneuropathy associated with type 2 diabetes mellitus, paresthesias in right hand Presentation consistent with worsening diabetic neuropathy.  Patient may increase gabapentin dose to 3 times daily as recommended by his PCP at his last appointment. Musculoskeletal exam stable, therefore deferred imaging of the right wrist/hand.  Neurovascularly intact to baseline to the distal right upper extremity. Encouraged PCP follow-up in the next 1 to 2 weeks.  Basic blood work drawn to assess for electrolyte abnormalities contributing to symptoms. Staff will call patient if blood work is abnormal and we will change treatment plan as indicated based on blood work.   Discussed physical exam and available lab work findings in clinic with patient.  Counseled patient regarding appropriate use of medications and potential side effects for all medications recommended or prescribed today. Discussed red flag signs and symptoms of worsening condition,when to call the PCP office, return to urgent care, and  when to seek higher level of care in the emergency department. Patient verbalizes understanding and agreement with plan. All questions answered. Patient discharged in stable condition.   Final Clinical Impressions(s) / UC Diagnoses   Final diagnoses:  Diabetic polyneuropathy associated with type 2 diabetes mellitus (HCC)  Type 2 diabetes mellitus with diabetic polyneuropathy, with long-term current use of insulin (HCC)  Paresthesias in right hand     Discharge Instructions      Your right hand pain and tingling is likely due to your diabetic neuropathy.  Take gabapentin 3 times daily.  Wear the right wrist brace as needed to stabilize the nerves in your wrist and help with your symptoms.  Please schedule a follow-up appointment with your primary care provider for ongoing evaluation of your symptoms.  Return to urgent care as needed.     ED Prescriptions   None    PDMP not reviewed this encounter.   Carlisle Beers, [place] 04/15/23 1329

## 2023-05-06 DIAGNOSIS — D649 Anemia, unspecified: Secondary | ICD-10-CM | POA: Diagnosis not present

## 2023-05-06 DIAGNOSIS — E785 Hyperlipidemia, unspecified: Secondary | ICD-10-CM | POA: Diagnosis not present

## 2023-05-06 DIAGNOSIS — Z1159 Encounter for screening for other viral diseases: Secondary | ICD-10-CM | POA: Diagnosis not present

## 2023-05-06 DIAGNOSIS — I10 Essential (primary) hypertension: Secondary | ICD-10-CM | POA: Diagnosis not present

## 2023-05-06 DIAGNOSIS — E119 Type 2 diabetes mellitus without complications: Secondary | ICD-10-CM | POA: Diagnosis not present

## 2023-07-25 ENCOUNTER — Encounter (HOSPITAL_COMMUNITY): Payer: Self-pay | Admitting: Emergency Medicine

## 2023-07-25 ENCOUNTER — Ambulatory Visit (HOSPITAL_COMMUNITY)
Admission: EM | Admit: 2023-07-25 | Discharge: 2023-07-25 | Disposition: A | Discharge status: Home / Self Care | Payer: Medicare Other

## 2023-07-25 ENCOUNTER — Other Ambulatory Visit: Payer: Self-pay

## 2023-07-25 DIAGNOSIS — J019 Acute sinusitis, unspecified: Secondary | ICD-10-CM | POA: Diagnosis not present

## 2023-07-25 DIAGNOSIS — M25512 Pain in left shoulder: Secondary | ICD-10-CM | POA: Diagnosis not present

## 2023-07-25 DIAGNOSIS — G8929 Other chronic pain: Secondary | ICD-10-CM | POA: Diagnosis not present

## 2023-07-25 DIAGNOSIS — M25511 Pain in right shoulder: Secondary | ICD-10-CM | POA: Diagnosis not present

## 2023-07-25 MED ORDER — AMOXICILLIN-POT CLAVULANATE 875-125 MG PO TABS
1.0000 | ORAL_TABLET | Freq: Two times a day (BID) | ORAL | 0 refills | Status: AC
Start: 2023-07-25 — End: 2023-08-01

## 2023-07-25 MED ORDER — PREDNISONE 10 MG PO TABS
10.0000 mg | ORAL_TABLET | Freq: Every day | ORAL | 0 refills | Status: AC
Start: 2023-07-25 — End: 2023-07-30

## 2023-07-25 NOTE — ED Provider Notes (Signed)
[facility]    CSN: [id number] Arrival date & time: 07/25/23  1717      History   Chief Complaint No chief complaint on file.   HPI Joel Rivera. is a 77 y.o. male.   Patient was seen and evaluated with Armandina Gemma, NP Student.  Patient presents today with bilateral shoulder pain that has been ongoing for the past couple of months.  Reports only has pain when he extends his arms above his head.  Denies any recent fall, trauma, or injury to the shoulders.  Reports [facility] Aspercreme without benefit.  He is also taking Tylenol for the pain without much benefit.  No numbness or tingling to the fingertips or decreased range of motion, but does have pain when moving hands overhead.  Patient also concerned about left sinus pain that has been ongoing for the past couple of years.  Reports the [facility] has told him to use saline rinses and he uses 1 bottle of saline in each sinus daily.  Reports if he does not do this, he developed severe pain in his sinuses.  Reports earlier today, he had severe sinus pain but this is now improved.  He is wondering if he may have a sinus infection.  He denies fever, cough, congestion, or sore throat.  No abdominal pain, nausea/vomiting, or diarrhea.  Medical history complicated by CKD, CAD, CHF, diabetes.  Last A1c approximately 3 months ago 6.3%.    Past Medical History:  Diagnosis Date   Agent orange exposure    Allergies    Arthritis    "neck; left shoulder" (10/25/2016)   CAD (coronary artery disease)    Carotid artery disease (HCC)    CHF (congestive heart failure) (HCC)    Chronic post-traumatic stress disorder (PTSD) 03/16/2015   Chronic sinus infection    CKD (chronic kidney disease), stage III (HCC)    Claustrophobia 03/16/2015   Depression    Diabetic neuropathy (HCC)    Former tobacco use    GERD (gastroesophageal reflux disease)    Hyperlipidemia    Hypertension    Morbid obesity (HCC)    OSA (obstructive  sleep apnea)    "been checked out several times; each time never got any treatment ordered" (10/25/2016)   PAD (peripheral artery disease) (HCC)    a. Diagnostic PV Angio 08/2016 -> s/p RLE PTA in 09/2016. b. s/p LLE PTA in 10/2016.   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 2016; going back in Feb 2018"   CORONARY ANGIOPLASTY  ~ 2004   CYST EXCISION Left 2008   inguinal area   CYST EXCISION N/A 03/23/2021   Procedure: SEBACEOUS CYST REMOVAL FROM BACK;  Surgeon: Harriette Bouillon, MD;  Location: [facility];  Service: General;  Laterality: N/A;   HERNIA REPAIR     "stomach; it's coming back again" (10/25/2016)   PERIPHERAL VASCULAR CATHETERIZATION N/A 08/27/2016   Procedure: Lower Extremity Angiography;  Surgeon: Runell Gess, MD;  Location: [facility];  Service: Cardiovascular;  Laterality: N/A;   PERIPHERAL VASCULAR CATHETERIZATION N/A 09/17/2016   Procedure: Lower Extremity  Angiography;  Surgeon: Runell Gess, MD;  Location: [facility] INVAS[facility] CV LAB;  Service: Cardiovascular;  Laterality: N/A;   PERIPHERAL VASCULAR CATHETERIZATION Right 09/17/2016   Procedure: Peripheral Vascular Atherectomy;  Surgeon: Runell Gess, MD;  Location: [facility];  Service: Cardiovascular;  Laterality: Right;  Common Femoral    PERIPHERAL VASCULAR CATHETERIZATION Left 10/25/2016   Procedure: Peripheral Vascular Atherectomy;  Surgeon: Runell Gess, MD;  Location: [facility];  Service: Cardiovascular;  Laterality: Left;  SFA       Home Medications    Prior to Admission medications   Medication Sig Start Date End Date Taking? Authorizing Provider  amoxicillin-clavulanate (AUGMENTIN) 875-125 MG tablet Take 1 tablet by mouth 2 (two) times daily for 7 days. 07/25/23 08/01/23 Yes Valentino Nose, NP  predniSONE (DELTASONE) 10 MG tablet Take 1 tablet (10 mg total) by mouth daily with breakfast for 5 doses. 07/25/23 07/30/23 Yes Valentino Nose, NP  Semaglutide (OZEMPIC, 1 MG/DOSE, Jacobus) Inject into the skin.   Yes [provider]  acetaminophen (TYLENOL) 325 MG tablet Take 650 mg by mouth every 6 (six) hours as needed (for pain.).    [provider]  aspirin EC 81 MG tablet Take 81 mg by mouth daily.    [provider]  carvedilol (COREG) 25 MG tablet Take 1 tablet (25 mg total) by mouth 2 (two) times daily with a meal. 09/18/16   Little Ishikawa, NP  cholecalciferol (VITAMIN D) 1000 UNITS tablet Take 1,000 Units by mouth daily.    [provider]  citalopram (CELEXA) 20 MG tablet Take 10 mg by mouth daily.    [provider]  diclofenac Sodium (VOLTAREN) 1 % GEL Apply 4 g topically 4 (four) times daily. 07/12/20   Darr, Gerilyn Pilgrim, PA-C  fluticasone (FLONASE) 50 MCG/ACT nasal spray Place 2 sprays into both nostrils daily. 12/04/19   Eustace Moore, MD  gabapentin (NEURONTIN) 300 MG capsule Take 300 mg by mouth 2 (two) times  daily. 06/17/20   [provider]  guaiFENesin (MUCINEX) 600 MG 12 hr tablet Take 600 mg by mouth daily as needed for cough or to loosen phlegm.    [provider]  HYDROcodone-acetaminophen (NORCO/VICODIN) 5-325 MG tablet Take 1 tablet by mouth every 6 (six) hours as needed for moderate pain. 03/23/21   Cornett, Maisie Fus, MD  Insulin Glargine-Lixisenatide 100-33 UNT-MCG/ML SOPN Inject 43 Units into the skin daily.    [provider]  ipratropium (ATROVENT) 0.06 % nasal spray as needed. 10/12/20   [provider]  JARDIANCE 25 MG TABS tablet Take 25 mg by mouth daily.  09/15/14   [provider]  linaclotide Karlene Einstein) 145 MCG CAPS capsule as needed.  [provider]  losartan (COZAAR) 100 MG tablet Take 0.5 tablets (50 mg total) by mouth daily. 11/04/21   Burnadette Pop, MD  pantoprazole (PROTONIX) 40 MG tablet Take 40 mg by mouth daily.    [provider]  polyethylene glycol powder (GLYCOLAX/MIRALAX) 17 GM/SCOOP powder Take 17 g by mouth daily. 07/12/20   Darr, Gerilyn Pilgrim, PA-C  rosuvastatin (CRESTOR) 5 MG tablet Take 1 tablet (5 mg total) by mouth daily. 01/24/22   Lyn Records, MD  sildenafil (VIAGRA) 100 MG tablet Take 100 mg by mouth as needed for erectile dysfunction. 02/16/15   [provider]  sodium chloride (OCEAN) 0.65 % SOLN nasal spray Place 1 spray into both nostrils 3 (three) times daily as needed for congestion.    [provider]    Family History Family History  Problem Relation Age of Onset   Urinary tract infection Mother    Pneumonia Mother    Aneurysm Father    Dementia Sister    Stroke Sister    Sarcoidosis Sister    Healthy Sister     Social History Social History   Tobacco Use   Smoking status: Former    Current packs/day: 0.00    Average packs/day: 3.0 packs/day for 13.0 years (39.0 ttl pk-yrs)    Types: Cigarettes    Start date: 60    Quit date: 1986    Years since quitting: 38.7    Smokeless tobacco: Never  Vaping Use   Vaping status: Never Used  Substance Use Topics   Alcohol use: Yes    Alcohol/week: 0.0 standard drinks of alcohol    Comment: 10/25/2016 "quit years ago"   Drug use: No     Allergies   Fluticasone, Atorvastatin, Fosinopril, Pravastatin, Simvastatin, and Terazosin   Review of Systems Review of Systems Per HPI  Physical Exam Triage Vital Signs ED Triage Vitals  Encounter Vitals Group     BP 07/25/23 1821 138/75     Systolic BP Percentile --      Diastolic BP Percentile --      Pulse Rate 07/25/23 1821 68     Resp 07/25/23 1821 18     Temp 07/25/23 1821 98 F (36.7 C)     Temp Source 07/25/23 1821 Oral     SpO2 07/25/23 1821 97 %     Weight --      Height --      Head Circumference --      Peak Flow --      Pain Score 07/25/23 1817 9     Pain Loc --      Pain Education --      Exclude from Growth Chart --    No data found.  Updated Vital Signs BP 138/75 (BP Location: Right Arm) Comment (BP Location): large cuff  Pulse 68   Temp 98 F (36.7 C) (Oral)   Resp 18   SpO2 97%   Visual Acuity Right Eye Distance:   Left Eye Distance:   Bilateral Distance:    Right Eye Near:   Left Eye Near:    Bilateral Near:     Physical Exam Vitals and nursing note reviewed.  Constitutional:      General: He is not in acute distress.    Appearance: Normal appearance. He is not ill-appearing or toxic-appearing.  HENT:     Head: Normocephalic and atraumatic.     Right Ear: Tympanic membrane, ear canal and external ear normal.  Left Ear: Tympanic membrane, ear canal and external ear normal.     Nose: Congestion present. No rhinorrhea.     Left Sinus: Maxillary sinus tenderness and frontal sinus tenderness present.     Mouth/Throat:     Mouth: Mucous membranes are moist.     Pharynx: Oropharynx is clear. No oropharyngeal exudate or posterior oropharyngeal erythema.  Eyes:     General: No scleral icterus.    Extraocular  Movements: Extraocular movements intact.  Cardiovascular:     Rate and Rhythm: Normal rate and regular rhythm.  Pulmonary:     Effort: Pulmonary effort is normal. No respiratory distress.     Breath sounds: Normal breath sounds. No wheezing, rhonchi or rales.  Abdominal:     General: Abdomen is flat. Bowel sounds are normal. There is no distension.     Palpations: Abdomen is soft.  Musculoskeletal:     Cervical back: Normal range of motion and neck supple.     Comments: Inspection: no swelling, bruising, obvious deformity or redness to bilateral upper extremities Palpation: tender to palpation to bilateral shoulders diffiusely; no obvious deformities palpated ROM: Difficult to assess secondary to pain Strength: 5/5 bilateral upper extremities Neurovascular: neurovascularly intact in bilateral upper extremities  Lymphadenopathy:     Cervical: No cervical adenopathy.  Skin:    General: Skin is warm and dry.     Coloration: Skin is not jaundiced or pale.     Findings: No erythema or rash.  Neurological:     Mental Status: He is alert and oriented to person, place, and time.     Motor: No weakness.  Psychiatric:        Mood and Affect: Mood normal.        Behavior: Behavior normal.      UC Treatments / Results  Labs (all labs ordered are listed, but only abnormal results are displayed) Labs Reviewed - No data to display  EKG   Radiology No results found.  Procedures Procedures (including critical care time)  Medications Ordered in UC Medications - No data to display  Initial Impression / Assessment and Plan / UC Course  I have reviewed the triage vital signs and the nursing notes.  Pertinent labs & imaging results that were available during my care of the patient were reviewed by me and considered in my medical decision making (see chart for details).   Patient is well-appearing, normotensive, afebrile, not tachycardic, not tachypneic, oxygenating well on room air.     1. Acute non-recurrent sinusitis, unspecified location Will attempt to treat for bacterial sinusitis with Augmentin twice daily for 7 days Renal dosing not indicated; creatinine clearance estimate 62.98 mL/min with most recent creatinine 1.44 on 05/06/2023 Discussed with patient that if this does not improve symptoms, he will need to follow-up with ENT and contact information provided today  2. Chronic pain of both shoulders Patient has not already been taking Tylenol, using Aspercreme, has tried Voltaren as well Need to avoid NSAIDs secondary to chronic kidney disease Will try low-dose prednisone to help with possible inflammation in shoulders Recommended follow-up with Ortho if symptoms do not improve with treatment  The patient was given the opportunity to ask questions.  All questions answered to their satisfaction.  The patient is in agreement to this plan.    Final Clinical Impressions(s) / UC Diagnoses   Final diagnoses:  Acute non-recurrent sinusitis, unspecified location  Chronic pain of both shoulders     Discharge Instructions  Take the Augmentin as prescribed to treat sinus infection.  If this does not help clear up your sinuses, follow-up with ENT.  Contact information is provided for you.  For the shoulder pain, start taking the prednisone that have sent to the pharmacy.  Follow-up with orthopedic provider if the pain does not improve with this treatment.     ED Prescriptions     Medication Sig Dispense Auth. Provider   amoxicillin-clavulanate (AUGMENTIN) 875-125 MG tablet Take 1 tablet by mouth 2 (two) times daily for 7 days. 14 tablet Cathlean Marseilles A, NP   predniSONE (DELTASONE) 10 MG tablet Take 1 tablet (10 mg total) by mouth daily with breakfast for 5 doses. 5 tablet Valentino Nose, NP      PDMP not reviewed this encounter.   Valentino Nose, NP 07/25/23 2052

## 2023-07-25 NOTE — Discharge Instructions (Signed)
Take the Augmentin as prescribed to treat sinus infection.  If this does not help clear up your sinuses, follow-up with ENT.  Contact information is provided for you.  For the shoulder pain, start taking the prednisone that have sent to the pharmacy.  Follow-up with orthopedic provider if the pain does not improve with this treatment.

## 2023-07-25 NOTE — ED Triage Notes (Addendum)
Limited range of motion with both arms due to pain. This episode has been for 3 weeks.  Patient has been using Aspercreme   Patient has sinus pain.  Performs sinus rinses.  Uses sinus sprays from [place], but no relief.

## 2023-08-12 DIAGNOSIS — J310 Chronic rhinitis: Secondary | ICD-10-CM | POA: Diagnosis not present

## 2023-08-12 DIAGNOSIS — R519 Headache, unspecified: Secondary | ICD-10-CM | POA: Diagnosis not present

## 2023-08-20 DIAGNOSIS — R42 Dizziness and giddiness: Secondary | ICD-10-CM | POA: Diagnosis not present

## 2023-08-20 DIAGNOSIS — Z9889 Other specified postprocedural states: Secondary | ICD-10-CM | POA: Diagnosis not present

## 2023-08-20 DIAGNOSIS — I440 Atrioventricular block, first degree: Secondary | ICD-10-CM | POA: Diagnosis not present

## 2023-08-20 DIAGNOSIS — Z743 Need for continuous supervision: Secondary | ICD-10-CM | POA: Diagnosis not present

## 2023-08-20 DIAGNOSIS — Z87891 Personal history of nicotine dependence: Secondary | ICD-10-CM | POA: Diagnosis not present

## 2023-08-20 DIAGNOSIS — G4730 Sleep apnea, unspecified: Secondary | ICD-10-CM | POA: Diagnosis not present

## 2023-08-20 DIAGNOSIS — R221 Localized swelling, mass and lump, neck: Secondary | ICD-10-CM | POA: Diagnosis not present

## 2023-08-20 DIAGNOSIS — D491 Neoplasm of unspecified behavior of respiratory system: Secondary | ICD-10-CM | POA: Diagnosis not present

## 2023-08-20 DIAGNOSIS — Z86018 Personal history of other benign neoplasm: Secondary | ICD-10-CM | POA: Diagnosis not present

## 2023-08-20 DIAGNOSIS — E1142 Type 2 diabetes mellitus with diabetic polyneuropathy: Secondary | ICD-10-CM | POA: Diagnosis not present

## 2023-08-20 DIAGNOSIS — Z7985 Long-term (current) use of injectable non-insulin antidiabetic drugs: Secondary | ICD-10-CM | POA: Diagnosis not present

## 2023-08-20 DIAGNOSIS — R55 Syncope and collapse: Secondary | ICD-10-CM | POA: Diagnosis not present

## 2023-08-20 DIAGNOSIS — R079 Chest pain, unspecified: Secondary | ICD-10-CM | POA: Diagnosis not present

## 2023-08-20 DIAGNOSIS — I288 Other diseases of pulmonary vessels: Secondary | ICD-10-CM | POA: Diagnosis not present

## 2023-08-20 DIAGNOSIS — Z7982 Long term (current) use of aspirin: Secondary | ICD-10-CM | POA: Diagnosis not present

## 2023-08-20 DIAGNOSIS — Z79899 Other long term (current) drug therapy: Secondary | ICD-10-CM | POA: Diagnosis not present

## 2023-08-20 DIAGNOSIS — R918 Other nonspecific abnormal finding of lung field: Secondary | ICD-10-CM | POA: Diagnosis not present

## 2023-08-20 DIAGNOSIS — E119 Type 2 diabetes mellitus without complications: Secondary | ICD-10-CM | POA: Diagnosis not present

## 2023-08-20 DIAGNOSIS — Z7984 Long term (current) use of oral hypoglycemic drugs: Secondary | ICD-10-CM | POA: Diagnosis not present

## 2023-08-20 DIAGNOSIS — I119 Hypertensive heart disease without heart failure: Secondary | ICD-10-CM | POA: Diagnosis not present

## 2023-08-21 DIAGNOSIS — R918 Other nonspecific abnormal finding of lung field: Secondary | ICD-10-CM | POA: Diagnosis not present

## 2023-08-21 DIAGNOSIS — D491 Neoplasm of unspecified behavior of respiratory system: Secondary | ICD-10-CM | POA: Diagnosis not present

## 2023-08-21 DIAGNOSIS — I359 Nonrheumatic aortic valve disorder, unspecified: Secondary | ICD-10-CM | POA: Diagnosis not present

## 2023-08-26 DIAGNOSIS — I129 Hypertensive chronic kidney disease with stage 1 through stage 4 chronic kidney disease, or unspecified chronic kidney disease: Secondary | ICD-10-CM | POA: Diagnosis not present

## 2023-08-26 DIAGNOSIS — N183 Chronic kidney disease, stage 3 unspecified: Secondary | ICD-10-CM | POA: Diagnosis not present

## 2023-08-26 DIAGNOSIS — E1122 Type 2 diabetes mellitus with diabetic chronic kidney disease: Secondary | ICD-10-CM | POA: Diagnosis not present

## 2023-09-23 DIAGNOSIS — J9811 Atelectasis: Secondary | ICD-10-CM | POA: Diagnosis not present

## 2023-09-23 DIAGNOSIS — C3412 Malignant neoplasm of upper lobe, left bronchus or lung: Secondary | ICD-10-CM | POA: Diagnosis not present

## 2023-09-23 DIAGNOSIS — R918 Other nonspecific abnormal finding of lung field: Secondary | ICD-10-CM | POA: Diagnosis not present

## 2023-09-30 DIAGNOSIS — K439 Ventral hernia without obstruction or gangrene: Secondary | ICD-10-CM | POA: Diagnosis not present

## 2023-09-30 DIAGNOSIS — R918 Other nonspecific abnormal finding of lung field: Secondary | ICD-10-CM | POA: Diagnosis not present

## 2023-09-30 DIAGNOSIS — R911 Solitary pulmonary nodule: Secondary | ICD-10-CM | POA: Diagnosis not present

## 2023-10-04 DIAGNOSIS — C3412 Malignant neoplasm of upper lobe, left bronchus or lung: Secondary | ICD-10-CM | POA: Diagnosis not present

## 2023-10-07 DIAGNOSIS — C3402 Malignant neoplasm of left main bronchus: Secondary | ICD-10-CM | POA: Diagnosis not present

## 2023-10-21 DIAGNOSIS — E1169 Type 2 diabetes mellitus with other specified complication: Secondary | ICD-10-CM | POA: Diagnosis not present

## 2023-10-21 DIAGNOSIS — C3402 Malignant neoplasm of left main bronchus: Secondary | ICD-10-CM | POA: Diagnosis not present

## 2023-10-21 DIAGNOSIS — I10 Essential (primary) hypertension: Secondary | ICD-10-CM | POA: Diagnosis not present

## 2023-10-21 DIAGNOSIS — K219 Gastro-esophageal reflux disease without esophagitis: Secondary | ICD-10-CM | POA: Diagnosis not present

## 2023-10-23 DIAGNOSIS — C3402 Malignant neoplasm of left main bronchus: Secondary | ICD-10-CM | POA: Diagnosis not present

## 2023-10-24 DIAGNOSIS — C3492 Malignant neoplasm of unspecified part of left bronchus or lung: Secondary | ICD-10-CM | POA: Diagnosis not present

## 2023-10-24 DIAGNOSIS — D649 Anemia, unspecified: Secondary | ICD-10-CM | POA: Diagnosis not present

## 2023-10-24 DIAGNOSIS — Z452 Encounter for adjustment and management of vascular access device: Secondary | ICD-10-CM | POA: Diagnosis not present

## 2023-10-24 DIAGNOSIS — C3490 Malignant neoplasm of unspecified part of unspecified bronchus or lung: Secondary | ICD-10-CM | POA: Diagnosis not present

## 2023-10-28 DIAGNOSIS — C3402 Malignant neoplasm of left main bronchus: Secondary | ICD-10-CM | POA: Diagnosis not present

## 2023-10-29 DIAGNOSIS — C3402 Malignant neoplasm of left main bronchus: Secondary | ICD-10-CM | POA: Diagnosis not present

## 2023-11-11 DIAGNOSIS — C3412 Malignant neoplasm of upper lobe, left bronchus or lung: Secondary | ICD-10-CM | POA: Diagnosis not present

## 2023-11-11 DIAGNOSIS — C3402 Malignant neoplasm of left main bronchus: Secondary | ICD-10-CM | POA: Diagnosis not present

## 2023-11-12 DIAGNOSIS — C3402 Malignant neoplasm of left main bronchus: Secondary | ICD-10-CM | POA: Diagnosis not present

## 2023-11-18 DIAGNOSIS — D649 Anemia, unspecified: Secondary | ICD-10-CM | POA: Diagnosis not present

## 2023-11-19 DIAGNOSIS — N183 Chronic kidney disease, stage 3 unspecified: Secondary | ICD-10-CM | POA: Diagnosis not present

## 2023-11-19 DIAGNOSIS — Z7689 Persons encountering health services in other specified circumstances: Secondary | ICD-10-CM | POA: Diagnosis not present

## 2023-11-19 DIAGNOSIS — E1142 Type 2 diabetes mellitus with diabetic polyneuropathy: Secondary | ICD-10-CM | POA: Diagnosis not present

## 2023-11-19 DIAGNOSIS — R931 Abnormal findings on diagnostic imaging of heart and coronary circulation: Secondary | ICD-10-CM | POA: Diagnosis not present

## 2023-11-19 DIAGNOSIS — C3490 Malignant neoplasm of unspecified part of unspecified bronchus or lung: Secondary | ICD-10-CM | POA: Diagnosis not present

## 2023-11-19 DIAGNOSIS — I129 Hypertensive chronic kidney disease with stage 1 through stage 4 chronic kidney disease, or unspecified chronic kidney disease: Secondary | ICD-10-CM | POA: Diagnosis not present

## 2023-11-19 DIAGNOSIS — E559 Vitamin D deficiency, unspecified: Secondary | ICD-10-CM | POA: Diagnosis not present

## 2023-11-19 DIAGNOSIS — K635 Polyp of colon: Secondary | ICD-10-CM | POA: Diagnosis not present

## 2023-11-25 DIAGNOSIS — Z510 Encounter for antineoplastic radiation therapy: Secondary | ICD-10-CM | POA: Diagnosis not present

## 2023-11-25 DIAGNOSIS — C3402 Malignant neoplasm of left main bronchus: Secondary | ICD-10-CM | POA: Diagnosis not present

## 2023-11-26 DIAGNOSIS — Z5111 Encounter for antineoplastic chemotherapy: Secondary | ICD-10-CM | POA: Diagnosis not present

## 2023-11-26 DIAGNOSIS — Z510 Encounter for antineoplastic radiation therapy: Secondary | ICD-10-CM | POA: Diagnosis not present

## 2023-11-26 DIAGNOSIS — C3402 Malignant neoplasm of left main bronchus: Secondary | ICD-10-CM | POA: Diagnosis not present

## 2023-11-27 DIAGNOSIS — Z510 Encounter for antineoplastic radiation therapy: Secondary | ICD-10-CM | POA: Diagnosis not present

## 2023-11-27 DIAGNOSIS — C3402 Malignant neoplasm of left main bronchus: Secondary | ICD-10-CM | POA: Diagnosis not present

## 2023-11-28 DIAGNOSIS — C3402 Malignant neoplasm of left main bronchus: Secondary | ICD-10-CM | POA: Diagnosis not present

## 2023-11-28 DIAGNOSIS — Z510 Encounter for antineoplastic radiation therapy: Secondary | ICD-10-CM | POA: Diagnosis not present

## 2023-11-29 DIAGNOSIS — Z510 Encounter for antineoplastic radiation therapy: Secondary | ICD-10-CM | POA: Diagnosis not present

## 2023-11-29 DIAGNOSIS — C3402 Malignant neoplasm of left main bronchus: Secondary | ICD-10-CM | POA: Diagnosis not present

## 2023-12-02 DIAGNOSIS — C3402 Malignant neoplasm of left main bronchus: Secondary | ICD-10-CM | POA: Diagnosis not present

## 2023-12-02 DIAGNOSIS — Z510 Encounter for antineoplastic radiation therapy: Secondary | ICD-10-CM | POA: Diagnosis not present

## 2023-12-03 DIAGNOSIS — C3402 Malignant neoplasm of left main bronchus: Secondary | ICD-10-CM | POA: Diagnosis not present

## 2023-12-03 DIAGNOSIS — Z510 Encounter for antineoplastic radiation therapy: Secondary | ICD-10-CM | POA: Diagnosis not present

## 2023-12-04 DIAGNOSIS — C3402 Malignant neoplasm of left main bronchus: Secondary | ICD-10-CM | POA: Diagnosis not present

## 2023-12-04 DIAGNOSIS — Z510 Encounter for antineoplastic radiation therapy: Secondary | ICD-10-CM | POA: Diagnosis not present

## 2023-12-05 DIAGNOSIS — C3402 Malignant neoplasm of left main bronchus: Secondary | ICD-10-CM | POA: Diagnosis not present

## 2023-12-05 DIAGNOSIS — Z510 Encounter for antineoplastic radiation therapy: Secondary | ICD-10-CM | POA: Diagnosis not present
